# Patient Record
Sex: Male | Born: 1955 | Race: White | Hispanic: No | Marital: Married | Smoking: Current every day smoker
Health system: Southern US, Community
[De-identification: ages and names within clinical notes are randomized; demographics above are authoritative.]

## PROBLEM LIST (undated history)

## (undated) DIAGNOSIS — F329 Major depressive disorder, single episode, unspecified: Secondary | ICD-10-CM

## (undated) DIAGNOSIS — I10 Essential (primary) hypertension: Secondary | ICD-10-CM

## (undated) DIAGNOSIS — F1010 Alcohol abuse, uncomplicated: Secondary | ICD-10-CM

## (undated) DIAGNOSIS — J449 Chronic obstructive pulmonary disease, unspecified: Secondary | ICD-10-CM

## (undated) DIAGNOSIS — M1990 Unspecified osteoarthritis, unspecified site: Secondary | ICD-10-CM

## (undated) DIAGNOSIS — Z972 Presence of dental prosthetic device (complete) (partial): Secondary | ICD-10-CM

## (undated) DIAGNOSIS — K589 Irritable bowel syndrome without diarrhea: Secondary | ICD-10-CM

## (undated) DIAGNOSIS — F32A Depression, unspecified: Secondary | ICD-10-CM

## (undated) DIAGNOSIS — K219 Gastro-esophageal reflux disease without esophagitis: Secondary | ICD-10-CM

## (undated) DIAGNOSIS — G459 Transient cerebral ischemic attack, unspecified: Secondary | ICD-10-CM

## (undated) DIAGNOSIS — A809 Acute poliomyelitis, unspecified: Secondary | ICD-10-CM

## (undated) DIAGNOSIS — E039 Hypothyroidism, unspecified: Secondary | ICD-10-CM

## (undated) DIAGNOSIS — F419 Anxiety disorder, unspecified: Secondary | ICD-10-CM

## (undated) HISTORY — PX: SHOULDER ARTHROSCOPY: SHX128

## (undated) HISTORY — PX: APPENDECTOMY: SHX54

---
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
## 2006-05-28 ENCOUNTER — Other Ambulatory Visit: Payer: Self-pay

## 2006-05-28 ENCOUNTER — Inpatient Hospital Stay: Payer: Self-pay | Admitting: Internal Medicine

## 2007-06-14 ENCOUNTER — Ambulatory Visit: Payer: Self-pay | Admitting: Gastroenterology

## 2008-01-07 ENCOUNTER — Emergency Department: Payer: Self-pay | Admitting: Emergency Medicine

## 2009-11-17 ENCOUNTER — Ambulatory Visit: Payer: Self-pay | Admitting: Unknown Physician Specialty

## 2012-05-03 ENCOUNTER — Ambulatory Visit: Payer: Self-pay | Admitting: Internal Medicine

## 2012-07-11 HISTORY — PX: SHOULDER ARTHROSCOPY: SHX128

## 2012-09-04 DIAGNOSIS — M75122 Complete rotator cuff tear or rupture of left shoulder, not specified as traumatic: Secondary | ICD-10-CM | POA: Insufficient documentation

## 2013-04-15 DIAGNOSIS — M5416 Radiculopathy, lumbar region: Secondary | ICD-10-CM | POA: Insufficient documentation

## 2013-05-06 ENCOUNTER — Encounter: Payer: Self-pay | Admitting: Specialist

## 2013-05-11 ENCOUNTER — Encounter: Payer: Self-pay | Admitting: Specialist

## 2014-04-03 ENCOUNTER — Ambulatory Visit: Payer: Self-pay | Admitting: Orthopedic Surgery

## 2014-09-17 ENCOUNTER — Ambulatory Visit: Payer: Self-pay | Admitting: Unknown Physician Specialty

## 2014-09-17 DIAGNOSIS — M25569 Pain in unspecified knee: Secondary | ICD-10-CM | POA: Insufficient documentation

## 2014-09-30 ENCOUNTER — Ambulatory Visit: Payer: Self-pay | Admitting: Unknown Physician Specialty

## 2014-11-11 ENCOUNTER — Encounter: Payer: Self-pay | Admitting: *Deleted

## 2014-11-11 ENCOUNTER — Other Ambulatory Visit: Payer: Self-pay

## 2014-11-24 ENCOUNTER — Inpatient Hospital Stay: Admission: RE | Admit: 2014-11-24 | Payer: Medicaid Other | Source: Ambulatory Visit

## 2014-11-25 DIAGNOSIS — F17200 Nicotine dependence, unspecified, uncomplicated: Secondary | ICD-10-CM | POA: Diagnosis not present

## 2014-11-25 DIAGNOSIS — Z79899 Other long term (current) drug therapy: Secondary | ICD-10-CM | POA: Diagnosis not present

## 2014-11-25 DIAGNOSIS — M94261 Chondromalacia, right knee: Secondary | ICD-10-CM | POA: Diagnosis not present

## 2014-11-25 DIAGNOSIS — S83249A Other tear of medial meniscus, current injury, unspecified knee, initial encounter: Secondary | ICD-10-CM | POA: Diagnosis present

## 2014-11-25 DIAGNOSIS — Z8249 Family history of ischemic heart disease and other diseases of the circulatory system: Secondary | ICD-10-CM | POA: Diagnosis not present

## 2014-11-25 DIAGNOSIS — M23203 Derangement of unspecified medial meniscus due to old tear or injury, right knee: Secondary | ICD-10-CM | POA: Diagnosis not present

## 2014-11-25 DIAGNOSIS — J449 Chronic obstructive pulmonary disease, unspecified: Secondary | ICD-10-CM | POA: Diagnosis not present

## 2014-11-25 NOTE — Patient Instructions (Signed)
  Your procedure is scheduled on: 11-26-14 Report to [facility] Desk 2nd Floor To find out your arrival time please call [phone] between 1PM - 3PM on 11-25-14  Remember: Instructions that are not followed completely may result in serious medical risk, up to and including death, or upon the discretion of your surgeon and anesthesiologist your surgery may need to be rescheduled.    __x__ 1. Do not eat food or drink liquids after midnight. No gum chewing or hard candies.     __x__ 2. No Alcohol for 24 hours before or after surgery.   ____ 3. Bring all medications with you on the day of surgery if instructed.    ____ 4. Notify your doctor if there is any change in your medical condition     (cold, fever, infections).     Do not wear jewelry, make-up, hairpins, clips or nail polish.  Do not wear lotions, powders, or perfumes. You may wear deodorant.  Do not shave 48 hours prior to surgery. Men may shave face and neck.  Do not bring valuables to the hospital.    [facility] is not responsible for any belongings or valuables.               Contacts, dentures or bridgework may not be worn into surgery.  Leave your suitcase in the car. After surgery it may be brought to your room.  For patients admitted to the hospital, discharge time is determined by your                treatment team.   Patients discharged the day of surgery will not be allowed to drive home.   Please read over the following fact sheets that you were given:     ____ Take these medicines the morning of surgery with A SIP OF WATER:    1.Levothyroxine  2. Metoprolol  3. Venlafaxine  4.  5.  6.  ____ Fleet Enema (as directed)   ____ Use CHG Soap as directed  ____ Use inhalers on the day of surgery  ____ Stop metformin 2 days prior to surgery    ____ Take 1/2 of usual insulin dose the night before surgery and none on the morning of surgery.   ____ Stop Coumadin/Plavix/aspirin on  N/A  ____ Stop Anti-inflammatories on N/A   ____ Stop supplements until after surgery.    ____ Bring C-Pap to the hospital.

## 2014-11-26 ENCOUNTER — Ambulatory Visit: Payer: Medicaid Other | Admitting: Anesthesiology

## 2014-11-26 ENCOUNTER — Encounter
Admission: RE | Disposition: A | Discharge status: Home / Self Care | Payer: Self-pay | Source: Ambulatory Visit | Attending: Unknown Physician Specialty

## 2014-11-26 ENCOUNTER — Ambulatory Visit
Admission: RE | Admit: 2014-11-26 | Discharge: 2014-11-26 | Disposition: A | Discharge status: Home / Self Care | Payer: Medicaid Other | Source: Ambulatory Visit | Attending: Unknown Physician Specialty | Admitting: Unknown Physician Specialty

## 2014-11-26 ENCOUNTER — Encounter: Payer: Self-pay | Admitting: Anesthesiology

## 2014-11-26 DIAGNOSIS — Z79899 Other long term (current) drug therapy: Secondary | ICD-10-CM | POA: Insufficient documentation

## 2014-11-26 DIAGNOSIS — M23203 Derangement of unspecified medial meniscus due to old tear or injury, right knee: Secondary | ICD-10-CM | POA: Insufficient documentation

## 2014-11-26 DIAGNOSIS — M94261 Chondromalacia, right knee: Secondary | ICD-10-CM | POA: Insufficient documentation

## 2014-11-26 DIAGNOSIS — J449 Chronic obstructive pulmonary disease, unspecified: Secondary | ICD-10-CM | POA: Insufficient documentation

## 2014-11-26 DIAGNOSIS — Z8249 Family history of ischemic heart disease and other diseases of the circulatory system: Secondary | ICD-10-CM | POA: Insufficient documentation

## 2014-11-26 DIAGNOSIS — Z9889 Other specified postprocedural states: Secondary | ICD-10-CM

## 2014-11-26 DIAGNOSIS — F17200 Nicotine dependence, unspecified, uncomplicated: Secondary | ICD-10-CM | POA: Insufficient documentation

## 2014-11-26 HISTORY — PX: KNEE ARTHROSCOPY WITH MEDIAL MENISECTOMY: SHX5651

## 2014-11-26 HISTORY — DX: Anxiety disorder, unspecified: F41.9

## 2014-11-26 HISTORY — DX: Unspecified osteoarthritis, unspecified site: M19.90

## 2014-11-26 HISTORY — DX: Depression, unspecified: F32.A

## 2014-11-26 HISTORY — DX: Hypothyroidism, unspecified: E03.9

## 2014-11-26 HISTORY — DX: Chronic obstructive pulmonary disease, unspecified: J44.9

## 2014-11-26 HISTORY — DX: Major depressive disorder, single episode, unspecified: F32.9

## 2014-11-26 HISTORY — DX: Essential (primary) hypertension: I10

## 2014-11-26 SURGERY — ARTHROSCOPY, KNEE, WITH MEDIAL MENISCECTOMY
Anesthesia: General | Laterality: Right

## 2014-11-26 MED ORDER — BUPIVACAINE HCL (PF) 0.5 % IJ SOLN
INTRAMUSCULAR | Status: DC | PRN
  Administered 2014-11-26: 15 mL

## 2014-11-26 MED ORDER — LACTATED RINGERS IR SOLN
Status: DC | PRN
  Administered 2014-11-26: 2700 mL/h

## 2014-11-26 MED ORDER — LIDOCAINE HCL (CARDIAC) 20 MG/ML IV SOLN
INTRAVENOUS | Status: DC | PRN
  Administered 2014-11-26: 50 mg via INTRAVENOUS

## 2014-11-26 MED ORDER — FENTANYL CITRATE (PF) 100 MCG/2ML IJ SOLN
INTRAMUSCULAR | Status: AC
Start: 2014-11-26 — End: 2014-11-26
  Administered 2014-11-26: 25 ug via INTRAVENOUS
  Filled 2014-11-26: qty 2

## 2014-11-26 MED ORDER — ONDANSETRON HCL 4 MG/2ML IJ SOLN: 4.0000 mg | Freq: Once | INTRAMUSCULAR | Status: DC | PRN

## 2014-11-26 MED ORDER — MIDAZOLAM HCL 2 MG/2ML IJ SOLN
INTRAMUSCULAR | Status: DC | PRN
  Administered 2014-11-26: 2 mg via INTRAVENOUS

## 2014-11-26 MED ORDER — ONDANSETRON HCL 4 MG/2ML IJ SOLN
INTRAMUSCULAR | Status: DC | PRN
  Administered 2014-11-26: 4 mg via INTRAVENOUS

## 2014-11-26 MED ORDER — FENTANYL CITRATE (PF) 100 MCG/2ML IJ SOLN
25.0000 ug | INTRAMUSCULAR | Status: DC | PRN
  Administered 2014-11-26 (×4): 25 ug via INTRAVENOUS

## 2014-11-26 MED ORDER — HYDROCODONE-ACETAMINOPHEN 5-325 MG PO TABS
ORAL_TABLET | ORAL | Status: AC
  Filled 2014-11-26: qty 2

## 2014-11-26 MED ORDER — BUPIVACAINE HCL (PF) 0.5 % IJ SOLN
INTRAMUSCULAR | Status: AC
  Filled 2014-11-26: qty 30

## 2014-11-26 MED ORDER — FAMOTIDINE 20 MG PO TABS
20.0000 mg | ORAL_TABLET | Freq: Once | ORAL | Status: AC
  Administered 2014-11-26: 20 mg via ORAL

## 2014-11-26 MED ORDER — LACTATED RINGERS IV SOLN
INTRAVENOUS | Status: DC
  Administered 2014-11-26 (×2): via INTRAVENOUS

## 2014-11-26 MED ORDER — FAMOTIDINE 20 MG PO TABS
ORAL_TABLET | ORAL | Status: AC
  Filled 2014-11-26: qty 1

## 2014-11-26 MED ORDER — KETOROLAC TROMETHAMINE 30 MG/ML IJ SOLN
INTRAMUSCULAR | Status: DC | PRN
  Administered 2014-11-26: 30 mg via INTRAVENOUS

## 2014-11-26 MED ORDER — HYDROCODONE-ACETAMINOPHEN 5-325 MG PO TABS
1.0000 | ORAL_TABLET | Freq: Four times a day (QID) | ORAL | Status: DC | PRN
  Administered 2014-11-26: 2 via ORAL

## 2014-11-26 MED ORDER — PROPOFOL 10 MG/ML IV BOLUS
INTRAVENOUS | Status: DC | PRN
  Administered 2014-11-26: 200 mg via INTRAVENOUS

## 2014-11-26 MED ORDER — FENTANYL CITRATE (PF) 100 MCG/2ML IJ SOLN
INTRAMUSCULAR | Status: DC | PRN
  Administered 2014-11-26: 50 ug via INTRAVENOUS
  Administered 2014-11-26: 100 ug via INTRAVENOUS
  Administered 2014-11-26: 50 ug via INTRAVENOUS

## 2014-11-26 MED ORDER — HYDROCODONE-ACETAMINOPHEN 5-325 MG PO TABS: 1.0000 | ORAL_TABLET | Freq: Four times a day (QID) | ORAL | Status: DC | PRN

## 2014-11-26 SURGICAL SUPPLY — 44 items
ADAPTER IRRIG TUBE 2 SPIKE SOL (ADAPTER) ×2 IMPLANT
ADPR TBG 2 SPK PMP STRL ASCP (ADAPTER) ×1
ARTHROWAND PARAGON T2 (SURGICAL WAND)
BNDG ESMARK 6X12 TAN STRL LF (GAUZE/BANDAGES/DRESSINGS) ×3 IMPLANT
BUR RADIUS 3.5 (BURR) IMPLANT
BUR RADIUS 4.0X18.5 (BURR) ×2 IMPLANT
BUR ROUND 5.5 (BURR) IMPLANT
BURR ROUND 12 FLUTE 4.0MM (BURR) IMPLANT
CHLORAPREP W/TINT 26ML (MISCELLANEOUS) ×1 IMPLANT
CUFF TOURN SGL QUICK 24 (TOURNIQUET CUFF)
CUFF TOURN SGL QUICK 34 (TOURNIQUET CUFF)
CUFF TRNQT CYL 24X4X40X1 (TOURNIQUET CUFF) IMPLANT
CUFF TRNQT CYL 34X4X40X1 (TOURNIQUET CUFF) IMPLANT
CUTTER SLOTTED WHISKER 4.0 (BURR) ×2 IMPLANT
DRAPE LEGGINS SURG 28X43 STRL (DRAPES) ×3 IMPLANT
DURAPREP 26ML APPLICATOR (WOUND CARE) ×2 IMPLANT
GAUZE SPONGE 4X4 12PLY STRL (GAUZE/BANDAGES/DRESSINGS) ×5 IMPLANT
GLOVE BIO SURGEON STRL SZ7.5 (GLOVE) ×5 IMPLANT
GLOVE BIO SURGEON STRL SZ8 (GLOVE) ×3 IMPLANT
GLOVE INDICATOR 8.0 STRL GRN (GLOVE) ×5 IMPLANT
GOWN STRL REIN 2XL XLG LVL4 (GOWN DISPOSABLE) ×3 IMPLANT
IV LACTATED RINGER IRRG 3000ML (IV SOLUTION) ×9
IV LR IRRIG 3000ML ARTHROMATIC (IV SOLUTION) ×2 IMPLANT
KIT RM TURNOVER STRD PROC AR (KITS) ×3 IMPLANT
MANIFOLD 4PT FOR NEPTUNE1 (MISCELLANEOUS) ×3 IMPLANT
PACK ARTHROSCOPY KNEE (MISCELLANEOUS) ×3 IMPLANT
PAD CAST CTTN 4X4 STRL (SOFTGOODS) IMPLANT
PADDING CAST COTTON 4X4 STRL (SOFTGOODS) ×3
SET TUBE SUCT SHAVER OUTFL 24K (TUBING) ×3 IMPLANT
SET TUBE TIP INTRA-ARTICULAR (MISCELLANEOUS) ×3 IMPLANT
STOCKINETTE BIAS CUT 6 980064 (GAUZE/BANDAGES/DRESSINGS) ×2 IMPLANT
SUT ETH BLK MONO 3 0 FS 1 12/B (SUTURE) ×3 IMPLANT
SUT ETHILON 3-0 FS-10 30 BLK (SUTURE) ×3
SUTURE EHLN 3-0 FS-10 30 BLK (SUTURE) ×1 IMPLANT
SWAB DUAL CULTURE TRANS RED ST (MISCELLANEOUS) ×2 IMPLANT
TAPE MICROFOAM 4IN (TAPE) ×3 IMPLANT
TUBING ARTHRO INFLOW-ONLY STRL (TUBING) ×3 IMPLANT
WAND ARTHRO PARAGON T2 (SURGICAL WAND) IMPLANT
WAND COVAC 50 IFS (MISCELLANEOUS) IMPLANT
WAND HAND CNTRL MULTIVAC 50 (MISCELLANEOUS) ×2 IMPLANT
WAND HAND CNTRL MULTIVAC 90 (MISCELLANEOUS) IMPLANT
WAND MEGAVAC 90 (MISCELLANEOUS) IMPLANT
WAND ULTRAVAC 90 (MISCELLANEOUS) IMPLANT
WRAP KNEE W/COLD PACKS 25.5X14 (SOFTGOODS) ×3 IMPLANT

## 2014-11-26 NOTE — OR Nursing (Signed)
Medial chondral debridement right knee

## 2014-11-26 NOTE — Transfer of Care (Signed)
Immediate Anesthesia Transfer of Care Note  Patient: Russell Rivas  Procedure(s) Performed: Procedure(s): KNEE ARTHROSCOPY WITH MEDIAL MENISECTOMY (Right)  Patient Location: PACU  Anesthesia Type:General  Level of Consciousness: Alert, Awake, Oriented  Airway & Oxygen Therapy: Patient Spontanous Breathing  Post-op Assessment: Report given to RN  Post vital signs: Reviewed and stable  Last Vitals:  Filed Vitals:   11/26/14 0900  BP: 164/101  Pulse: 102  Temp: 36.7 C  Resp: 12    Complications: No apparent anesthesia complications

## 2014-11-26 NOTE — Anesthesia Postprocedure Evaluation (Signed)
  Anesthesia Post-op Note  Patient: Russell Rivas  Procedure(s) Performed: Procedure(s): KNEE ARTHROSCOPY WITH MEDIAL MENISECTOMY (Right)  Anesthesia type:General  Patient location: PACU  Post pain: Pain level controlled  Post assessment: Post-op Vital signs reviewed, Patient's Cardiovascular Status Stable, Respiratory Function Stable, Patent Airway and No signs of Nausea or vomiting  Post vital signs: Reviewed and stable  Last Vitals:  Filed Vitals:   11/26/14 1008  BP: 142/90  Pulse: 92  Temp: 36.4 C  Resp: 16    Level of consciousness: awake, alert  and patient cooperative  Complications: No apparent anesthesia complications

## 2014-11-26 NOTE — Anesthesia Procedure Notes (Signed)
Procedure Name: LMA Insertion Date/Time: 11/26/2014 7:39 AM Performed by: Eliberto Ivory Pre-anesthesia Checklist: Patient identified, Patient being monitored, Timeout performed, Emergency Drugs available and Suction available Patient Re-evaluated:Patient Re-evaluated prior to inductionOxygen Delivery Method: Circle system utilized Preoxygenation: Pre-oxygenation with 100% oxygen Intubation Type: IV induction Ventilation: Mask ventilation without difficulty LMA: LMA inserted LMA Size: 4.0 Tube type: Oral Number of attempts: 1 Placement Confirmation: positive ETCO2 and breath sounds checked- equal and bilateral Tube secured with: Tape Dental Injury: Teeth and Oropharynx as per pre-operative assessment

## 2014-11-26 NOTE — Brief Op Note (Signed)
11/26/2014  9:13 AM  PATIENT:  Russell Rivas  59 y.o. male  PRE-OPERATIVE DIAGNOSIS:  ymedial meniscus tear right knee  POST-OPERATIVE DIAGNOSIS:  medial meniscus tear , chondromalacia right knee  PROCEDURE:  Procedure(s): KNEE ARTHROSCOPY WITH MEDIAL MENISECTOMY (Right)  SURGEON:  Surgeon(s) and Role:    * Leanor Kail, MD - Primary  PHYSICIAN ASSISTANT:   ASSISTANTS: none   ANESTHESIA:   general  EBL:  Total I/O In: 600 [I.V.:600] Out: -   BLOOD ADMINISTERED:none  DRAINS: none   LOCAL MEDICATIONS USED:  MARCAINE     SPECIMEN:  Source of Specimen:  knee fluid  DISPOSITION OF SPECIMEN:  PATHOLOGY  COUNTS:  ACTION TAKEN: None  TOURNIQUET:  * No tourniquets in log *  DICTATION:   PLAN OF CARE: Discharge to home after PACU  PATIENT DISPOSITION:  PACU - hemodynamically stable.   Delay start of Pharmacological VTE agent (>24hrs) due to surgical blood loss or risk of bleeding: not applicable

## 2014-11-26 NOTE — Op Note (Addendum)
Preoperative diagnosis: Torn medial meniscus right knee Postop diagnosis: same plus chondromalacia Operation: arthroscopic partial medial meniscectomy plus chondral debridement Surgeon: Dr. Leanor Kail Anesthesia: Gen.   History: Patient's had a long history of right knee pain. He had a remote anterior cruciate ligament reconstruction and subsequent infection. The infection cleared. Recently the patient had been having medial knee pain. His plain films did not reveal any significant degenerative changes. He had an MRI which revealed a torn medial meniscus. His anterior cruciate ligament graft was frayed. The patient was scheduled for surgery due to persistent discomfort despite conservative treatment.  The patient was taken the operating room where satisfactory general anesthesia was achieved. A tourniquet and leg holder were was applied to the right thigh. The left lower extremity was supported with a well leg holder. The right knee was prepped and draped in usual fashion for an arthroscopic procedure. An inflow cannula was introduced superomedially. The joint was distended with lactated Ringer's. Scope was introduced through an inferolateral puncture wound and a probe through an inferomedial puncture wound. Inspection of the medial compartment revealed that there was some fraying of the articular surfaces. There was maceration of the medial meniscus. I resected the torn portion of the medial meniscus using a motorized resector. The remaining rim was contoured with an ArthroCare angled wand. Inspection of the intercondylar notch revealed that there was fraying of the anterior cruciate graft. The graft was basically intact, however. Inspection of the lateral compartment revealed some grade 2 chondral changes in the anterior aspect of the weightbearing portion of the lateral femoral condyle. There were also some grade 2-3 chondral changes about the anterior aspect of the lateral tibial plateau articular  surface. These areas were debrided with a turbo whisker. The scope was introduced through the intercondylar notch into into the posterior recess of the medial and lateral compartments. No additional pathology was noted in the posterior recesses. Trochlear groove was inspected and appeared to be fairly smooth.  Patella surface was also fairly smooth. I observed patella tracking from the superomedial portal. The the patella seemed to track fairly well.  The instruments were removed from the joint at this time. The puncture wounds were closed with 3-0 nylon in vertical mattress fashion. I injected each puncture wound with several cc of half percent Marcaine without epinephrine. Betadine was applied the wounds followed by sterile dressing. An ice pack was applied to the right knee.  The tourniquet was not inflated during the course of the procedure. Blood loss was negligible. I did culture the synovial fluid during the course of the procedure. It was sent to the lab for Gram stain along with aerobic and anaerobic cultures.

## 2014-11-26 NOTE — Anesthesia Preprocedure Evaluation (Addendum)
Anesthesia Evaluation  Patient identified by MRN, date of birth, ID band Patient awake    Reviewed: Allergy & Precautions, NPO status , Patient's Chart, lab work & pertinent test results, reviewed documented beta blocker date and time   Airway Mallampati: II  TM Distance: >3 FB Neck ROM: Full    Dental  (+) Partial Upper, Partial Lower   Pulmonary COPDCurrent Smoker,          Cardiovascular hypertension,     Neuro/Psych Anxiety Depression    GI/Hepatic   Endo/Other  Hypothyroidism   Renal/GU      Musculoskeletal  (+) Arthritis -,   Abdominal   Peds  Hematology   Anesthesia Other Findings   Reproductive/Obstetrics                            Anesthesia Physical Anesthesia Plan  ASA: III  Anesthesia Plan: General   Post-op Pain Management:    Induction: Intravenous  Airway Management Planned: LMA  Additional Equipment:   Intra-op Plan:   Post-operative Plan:   Informed Consent: I have reviewed the patients History and Physical, chart, labs and discussed the procedure including the risks, benefits and alternatives for the proposed anesthesia with the patient or authorized representative who has indicated his/her understanding and acceptance.     Plan Discussed with: CRNA  Anesthesia Plan Comments:         Anesthesia Quick Evaluation

## 2014-11-26 NOTE — H&P (Signed)
  H and P reviewed. No changes. Uploaded at later date. 

## 2014-11-26 NOTE — Discharge Instructions (Addendum)
Diet: As you were doing prior to hospitalization   Shower:  May shower but keep the wounds dry, use an occlusive plastic wrap or extremity protector. NO SOAKING IN TUB.   Dressing:  You may remove your dressing 3 days after surgery. Then apply waterproof bandaids and change them after showering.   Activity:  Increase activity slowly as tolerated. Can drive when comfortable.    Weight Bearing: as tolerated.    To prevent constipation: you may use a stool softener such as - Miralax (over the counter) for constipation as needed.    To prevent venous clotting Take one 81 mg. ASA tablet  2X per day for about 2 weeks post surgery.  Precautions:  If you experience chest pain or shortness of breath - call 911 immediately for transfer to the hospital emergency department!!  If you develop a fever greater that 101 F, purulent drainage from wound, increased redness or drainage from wound, or calf pain -- Call the office at [phone]                                             Follow- Up Appointment:  Please call for an appointment to be seen in 1 wk.      AMBULATORY SURGERY  DISCHARGE INSTRUCTIONS   1) The drugs that you were given will stay in your system until tomorrow so for the next 24 hours you should not:  A) Drive an automobile B) Make any legal decisions C) Drink any alcoholic beverage   2) You may resume regular meals tomorrow.  Today it is better to start with liquids and gradually work up to solid foods.  You may eat anything you prefer, but it is better to start with liquids, then soup and crackers, and gradually work up to solid foods.   3) Please notify your doctor immediately if you have any unusual bleeding, trouble breathing, redness and pain at the surgery site, drainage, fever, or pain not relieved by medication. 4)   5) Your post-operative visit with Dr.    George Ina                                 is: Date:                        Time:    Please call to  schedule your post-operative visit.  6) Additional Instructions: 7)

## 2014-11-27 ENCOUNTER — Encounter: Payer: Self-pay | Admitting: Unknown Physician Specialty

## 2014-11-30 LAB — ANAEROBIC CULTURE

## 2014-11-30 LAB — WOUND CULTURE: Culture: NO GROWTH

## 2014-12-05 DIAGNOSIS — S83209A Unspecified tear of unspecified meniscus, current injury, unspecified knee, initial encounter: Secondary | ICD-10-CM | POA: Insufficient documentation

## 2014-12-05 DIAGNOSIS — M12569 Traumatic arthropathy, unspecified knee: Secondary | ICD-10-CM | POA: Insufficient documentation

## 2014-12-26 LAB — GRAM STAIN

## 2015-02-27 ENCOUNTER — Emergency Department
Admission: EM | Admit: 2015-02-27 | Discharge: 2015-02-27 | Disposition: A | Discharge status: Home / Self Care | Payer: Medicare Other | Attending: Emergency Medicine | Admitting: Emergency Medicine

## 2015-02-27 ENCOUNTER — Emergency Department: Payer: Medicare Other

## 2015-02-27 ENCOUNTER — Encounter: Payer: Self-pay | Admitting: *Deleted

## 2015-02-27 DIAGNOSIS — Z79899 Other long term (current) drug therapy: Secondary | ICD-10-CM | POA: Diagnosis not present

## 2015-02-27 DIAGNOSIS — F10920 Alcohol use, unspecified with intoxication, uncomplicated: Secondary | ICD-10-CM

## 2015-02-27 DIAGNOSIS — F10120 Alcohol abuse with intoxication, uncomplicated: Secondary | ICD-10-CM | POA: Diagnosis not present

## 2015-02-27 DIAGNOSIS — I10 Essential (primary) hypertension: Secondary | ICD-10-CM | POA: Diagnosis not present

## 2015-02-27 DIAGNOSIS — R079 Chest pain, unspecified: Secondary | ICD-10-CM | POA: Insufficient documentation

## 2015-02-27 DIAGNOSIS — Z720 Tobacco use: Secondary | ICD-10-CM | POA: Diagnosis not present

## 2015-02-27 HISTORY — DX: Alcohol abuse, uncomplicated: F10.10

## 2015-02-27 LAB — CBC WITH DIFFERENTIAL/PLATELET
Basophils Absolute: 0 10*3/uL (ref 0–0.1)
Basophils Relative: 1 %
Eosinophils Absolute: 0.1 10*3/uL (ref 0–0.7)
Eosinophils Relative: 2 %
HCT: 41.2 % (ref 40.0–52.0)
Hemoglobin: 14 g/dL (ref 13.0–18.0)
Lymphocytes Relative: 51 %
Lymphs Abs: 2.3 10*3/uL (ref 1.0–3.6)
MCH: 34.2 pg — ABNORMAL HIGH (ref 26.0–34.0)
MCHC: 33.9 g/dL (ref 32.0–36.0)
MCV: 100.8 fL — ABNORMAL HIGH (ref 80.0–100.0)
Monocytes Absolute: 0.5 10*3/uL (ref 0.2–1.0)
Monocytes Relative: 10 %
Neutro Abs: 1.6 10*3/uL (ref 1.4–6.5)
Neutrophils Relative %: 36 %
Platelets: 135 10*3/uL — ABNORMAL LOW (ref 150–440)
RBC: 4.09 MIL/uL — ABNORMAL LOW (ref 4.40–5.90)
RDW: 13.7 % (ref 11.5–14.5)
WBC: 4.5 10*3/uL (ref 3.8–10.6)

## 2015-02-27 LAB — COMPREHENSIVE METABOLIC PANEL
ALT: 81 U/L — ABNORMAL HIGH (ref 17–63)
AST: 120 U/L — ABNORMAL HIGH (ref 15–41)
Albumin: 4.2 g/dL (ref 3.5–5.0)
Alkaline Phosphatase: 57 U/L (ref 38–126)
Anion gap: 11 (ref 5–15)
BUN: 8 mg/dL (ref 6–20)
CO2: 26 mmol/L (ref 22–32)
Calcium: 8.7 mg/dL — ABNORMAL LOW (ref 8.9–10.3)
Chloride: 104 mmol/L (ref 101–111)
Creatinine, Ser: 0.62 mg/dL (ref 0.61–1.24)
GFR calc Af Amer: >60 mL/min (ref >60)
GFR calc non Af Amer: >60 mL/min (ref >60)
Glucose, Bld: 91 mg/dL (ref 65–99)
Potassium: 3.7 mmol/L (ref 3.5–5.1)
Sodium: 141 mmol/L (ref 135–145)
Total Bilirubin: 0.7 mg/dL (ref 0.3–1.2)
Total Protein: 7.8 g/dL (ref 6.5–8.1)

## 2015-02-27 LAB — LIPASE, BLOOD: Lipase: 44 U/L (ref 22–51)

## 2015-02-27 LAB — ETHANOL: Alcohol, Ethyl (B): 365 mg/dL (ref <5)

## 2015-02-27 LAB — TROPONIN I
Troponin I: <0.03 ng/mL (ref <0.031)
Troponin I: <0.03 ng/mL (ref <0.031)

## 2015-02-27 MED ORDER — ONDANSETRON HCL 4 MG/2ML IJ SOLN
4.0000 mg | Freq: Once | INTRAMUSCULAR | Status: AC
  Administered 2015-02-27: 4 mg via INTRAVENOUS
  Filled 2015-02-27: qty 2

## 2015-02-27 NOTE — ED Notes (Signed)
Patient verbalized that information was given to him for detox and the plan was for his girlfriend to take him to [place].

## 2015-02-27 NOTE — BHH Counselor (Signed)
Per Pt.s permission we gave his contact information to a Substance Abuse Peer Support from [employer], [place].

## 2015-02-27 NOTE — ED Provider Notes (Signed)
-----------------------------------------   8:59 AM on 02/27/2015 -----------------------------------------  Patient states he is not having chest pain now. He is awake and alert. No signs of withdrawal, he denies any hallucinations or shakes at this time. Vital signs are stable. Clinically does not appear to have any signs of alcohol drawn at this time.  We'll discharge the patient to do seen by TTS who advised follow-up, and his wife plans to take him to [facility] today for evaluation of detox.  Return precautions discussed the patient is agreeable with the plan. He's got a follow-up with physician regarding his chest pain and also frequent amounts.  Delman Kitten, MD 02/27/15 0900

## 2015-02-27 NOTE — BH Assessment (Signed)
Assessment Note  Russell Rivas is an 59 y.o. male who presents to the ER due to having chest pains. Patient states, his family has a history of heart disease and he was concerned about his heart. Patient has a long history of alcohol use. He states he start drinking when at an early age. He states, he stopped drinking this past December but relapsed this past March. He has been drinking on daily basis in the amount of a 5th of liquor. Patient has  Been impatient at several facilities in the past; [facility] and [facility].  Reported past symptoms of withdrawal are; cold sweats, shakes, dizziness & vomiting.  Patient denies, SI/HI and AV/H.  Per request of ER MD (Dr. Jacqualine Code), writer provided the pt. with information and instructions on how to access Outpatient. Substance Abuse Treatment ([facility], [facility] and [facility]).  Also provided patient with contact information for [facility] Peer Support Lanae Boast B.). Obtained verbal consent to give [facility] Peer Support his contact information, to follow up with him, to help him connect with support groups in the community.   Axis I: Alcohol Abuse Axis III:  Past Medical History  Diagnosis Date  . Hypertension   . Anxiety   . Depression   . Arthritis   . COPD (chronic obstructive pulmonary disease)     NO inhalers  . Hypothyroidism   . Alcohol abuse     pt reports drinking at least one pint everyday.    Axis IV: other psychosocial or environmental problems and problems related to social environment  Past Medical History:  Past Medical History  Diagnosis Date  . Hypertension   . Anxiety   . Depression   . Arthritis   . COPD (chronic obstructive pulmonary disease)     NO inhalers  . Hypothyroidism   . Alcohol abuse     pt reports drinking at least one pint everyday.     Past Surgical History  Procedure Laterality Date  . Knee arthroscopy    . Appendectomy    . Shoulder arthroscopy    . Knee arthroscopy with medial menisectomy  Right 11/26/2014    Procedure: KNEE ARTHROSCOPY WITH MEDIAL MENISECTOMY;  Surgeon: Leanor Kail, MD;  Location: [facility] ORS;  Service: Orthopedics;  Laterality: Right;    Family History: History reviewed. No pertinent family history.  Social History:  reports that he has been smoking Cigarettes.  He has a 30 pack-year smoking history. He does not have any smokeless tobacco history on file. He reports that he drinks alcohol. He reports that he does not use illicit drugs.  Additional Social History:  Alcohol / Drug Use Pain Medications: None Reported Prescriptions: None Reported Over the Counter: None Reported History of alcohol / drug use?: Yes Longest period of sobriety (when/how long): A year Negative Consequences of Use: Personal relationships, Work / Youth worker Withdrawal Symptoms: Sweats, Nausea / Vomiting, Weakness, Cramps, Change in blood pressure, Tremors, Fever / Chills, Agitation Substance #1 Name of Substance 1: Alcohol 1 - Age of First Use: Teenager 1 - Amount (size/oz): 5th of liquor 1 - Frequency: Daily 1 - Duration: 39 years 1 - Last Use / Amount: 08/19/206  CIWA: CIWA-Ar BP: 109/64 mmHg Pulse Rate: 75 COWS:    Allergies:  Allergies  Allergen Reactions  . Hydrocodone Itching    Home Medications:  (Not in a hospital admission)  OB/GYN Status:  No LMP for male patient.  General Assessment Data Location of Assessment: [facility] ED TTS Assessment: In system  Is this a Tele or Face-to-Face Assessment?: Face-to-Face Is this an Initial Assessment or a Re-assessment for this encounter?: Initial Assessment Marital status: Long term relationship (Engaged) Maiden name: n/a Is patient pregnant?: No Pregnancy Status: No Living Arrangements: Spouse/significant other Can pt return to current living arrangement?: Yes Admission Status: Voluntary Is patient capable of signing voluntary admission?: Yes Referral Source: Self/Family/Friend Insurance type: Medicare  Medical  Screening Exam ([place]) Medical Exam completed: Yes  Crisis Care Plan Living Arrangements: Spouse/significant other Name of Psychiatrist: n/a Name of Therapist: n/a  Education Status Is patient currently in school?: No Current Grade: n/a Highest grade of school patient has completed: 12th Grade Name of school: n/a Contact person: n/a  Risk to self with the past 6 months Suicidal Ideation: No Has patient been a risk to self within the past 6 months prior to admission? : No Suicidal Intent: No Has patient had any suicidal intent within the past 6 months prior to admission? : No Is patient at risk for suicide?: No Suicidal Plan?: No Has patient had any suicidal plan within the past 6 months prior to admission? : No Access to Means: No What has been your use of drugs/alcohol within the last 12 months?: Alcohol and cocaine use in the past. Previous Attempts/Gestures: No Other Self Harm Risks: None Reported Triggers for Past Attempts: None known Intentional Self Injurious Behavior: None Family Suicide History: Unknown Recent stressful life event(s):  (Relapse) Persecutory voices/beliefs?: No Depression: No Depression Symptoms: Guilt Substance abuse history and/or treatment for substance abuse?: Yes (Alcohol) Suicide prevention information given to non-admitted patients: Not applicable  Risk to Others within the past 6 months Homicidal Ideation: No Does patient have any lifetime risk of violence toward others beyond the six months prior to admission? : No Thoughts of Harm to Others: No Current Homicidal Intent: No Current Homicidal Plan: No Access to Homicidal Means: No Identified Victim: None Reported History of harm to others?: No Assessment of Violence: None Noted Violent Behavior Description: None Reported Does patient have access to weapons?: No Criminal Charges Pending?: No Does patient have a court date: No Is patient on probation?:  No  Psychosis Hallucinations: None noted Delusions: None noted  Mental Status Report Appearance/Hygiene: Unremarkable, In hospital gown Eye Contact: Poor Motor Activity: Unable to assess Speech: Logical/coherent Level of Consciousness: Alert Mood: Anxious, Sad, Guilty Affect: Appropriate to circumstance Anxiety Level: Minimal Thought Processes: Coherent, Relevant Judgement: Unimpaired Orientation: Person, Place, Time, Situation, Appropriate for developmental age Obsessive Compulsive Thoughts/Behaviors: Minimal  Cognitive Functioning Concentration: Normal Memory: Recent Intact, Remote Intact IQ: Average Insight: Fair Impulse Control: Poor Appetite: Fair Weight Loss: 0 Weight Gain: 0 Sleep: No Change Total Hours of Sleep: 8 Vegetative Symptoms: None  ADLScreening [facility] Assessment Services) Patient's cognitive ability adequate to safely complete daily activities?: Yes Patient able to express need for assistance with ADLs?: Yes Independently performs ADLs?: Yes (appropriate for developmental age)  Prior Inpatient Therapy Prior Inpatient Therapy: Yes Prior Therapy Dates: Patient didn't remember dates Prior Therapy Facilty/Provider(s): [facility] Reason for Treatment: Alcohol Abuse  Prior Outpatient Therapy Prior Outpatient Therapy: No Prior Therapy Facilty/Provider(s): n/a Reason for Treatment: n/a Does patient have an ACCT team?: No Does patient have Intensive In-House Services?  : No Does patient have [facility] services? : No Does patient have P4CC services?: No  ADL Screening (condition at time of admission) Patient's cognitive ability adequate to safely complete daily activities?: Yes Patient able to express need for assistance with ADLs?: Yes  Independently performs ADLs?: Yes (appropriate for developmental age)       Abuse/Neglect Assessment (Assessment to be complete while patient is alone) Physical Abuse: Denies Verbal Abuse: Denies Sexual  Abuse: Denies Exploitation of patient/patient's resources: Denies Self-Neglect: Denies Values / Beliefs Cultural Requests During Hospitalization: None Spiritual Requests During Hospitalization: None Consults Spiritual Care Consult Needed: No Social Work Consult Needed: No Regulatory affairs officer (For Healthcare) Does patient have an advance directive?: No    Additional Information 1:1 In Past 12 Months?: No CIRT Risk: No Elopement Risk: No Does patient have medical clearance?: Yes  Child/Adolescent Assessment Running Away Risk: Denies (Patient is an adult)  Disposition:  Disposition Initial Assessment Completed for this Encounter: Yes Disposition of Patient: Outpatient treatment Type of outpatient treatment: Adult, Chemical Dependence - Intensive Outpatient  On Site Evaluation by:   Reviewed with Physician:    Durene Cal 02/27/2015 9:37 AM

## 2015-02-27 NOTE — ED Provider Notes (Signed)
[facility] Emergency Department Provider Note  ____________________________________________  Time seen: 1:50 AM  I have reviewed the triage vital signs and the nursing notes.   HISTORY  Chief Complaint Chest Pain and Numbness      HPI Russell Rivas is a 59 y.o. male presents via EMS with complaint of 5 out of 10 central chest pain with radiation to the left arm 3 hours. Patient admits to a family history of myocardial infarctions. Patient denies any aggravating or alleviating factors for his pain. Patient received aspirin 325 mg and 1 inch of Nitropaste by EMS prior to presentation to emergency department. Patient admits to consuming approximately 1 pint of alcohol tonight.     Past Medical History  Diagnosis Date  . Hypertension   . Anxiety   . Depression   . Arthritis   . COPD (chronic obstructive pulmonary disease)     NO inhalers  . Hypothyroidism   . Alcohol abuse     pt reports drinking at least one pint everyday.     There are no active problems to display for this patient.   Past Surgical History  Procedure Laterality Date  . Knee arthroscopy    . Appendectomy    . Shoulder arthroscopy    . Knee arthroscopy with medial menisectomy Right 11/26/2014    Procedure: KNEE ARTHROSCOPY WITH MEDIAL MENISECTOMY;  Surgeon: Leanor Kail, MD;  Location: [facility] ORS;  Service: Orthopedics;  Laterality: Right;    Current Outpatient Rx  Name  Route  Sig  Dispense  Refill  . Multiple Vitamins-Minerals (MULTIVITAMIN WITH MINERALS) tablet   Oral   Take 1 tablet by mouth daily.           Allergies Hydrocodone  History reviewed. No pertinent family history.  Social History Social History  Substance Use Topics  . Smoking status: Current Every Day Smoker -- 1.00 packs/day for 30 years    Types: Cigarettes  . Smokeless tobacco: None  . Alcohol Use: Yes     Comment: 1 pint liquer per day.     Review of Systems  Constitutional:  Negative for fever. Eyes: Negative for visual changes. ENT: Negative for sore throat. Cardiovascular: Positive for chest pain. Respiratory: Negative for shortness of breath. Gastrointestinal: Negative for abdominal pain, vomiting and diarrhea. Genitourinary: Negative for dysuria. Musculoskeletal: Negative for back pain. Skin: Negative for rash. Neurological: Negative for headaches, focal weakness or numbness.   10-point ROS otherwise negative.  ____________________________________________   PHYSICAL EXAM:  VITAL SIGNS: ED Triage Vitals  Enc Vitals Group     BP 02/27/15 0158 133/89 mmHg     Pulse Rate 02/27/15 0158 87     Resp 02/27/15 0158 16     Temp 02/27/15 0158 97.8 F (36.6 C)     Temp Source 02/27/15 0158 Oral     SpO2 02/27/15 0158 93 %     Weight 02/27/15 0158 170 lb (77.111 kg)     Height 02/27/15 0158 5\' 6"  (1.676 m)     Head Cir --      Peak Flow --      Pain Score 02/27/15 0200 5     Pain Loc --      Pain Edu? --      Excl. in [place]? --      Constitutional: Alert and oriented. Well appearing and in no distress. EtOH on breath Eyes: Conjunctivae are normal. PERRL. Normal extraocular movements. ENT   Head: Normocephalic and atraumatic.   Nose:  No congestion/rhinnorhea.   Mouth/Throat: Mucous membranes are moist.   Neck: No stridor. Hematological/Lymphatic/Immunilogical: No cervical lymphadenopathy. Cardiovascular: Normal rate, regular rhythm. Normal and symmetric distal pulses are present in all extremities. No murmurs, rubs, or gallops. Respiratory: Normal respiratory effort without tachypnea nor retractions. Breath sounds are clear and equal bilaterally. No wheezes/rales/rhonchi. Gastrointestinal: Soft and nontender. No distention. There is no CVA tenderness. Genitourinary: deferred Musculoskeletal: Nontender with normal range of motion in all extremities. No joint effusions.  No lower extremity tenderness nor edema. Neurologic:  Normal speech  and language. No gross focal neurologic deficits are appreciated. Speech is normal.  Skin:  Skin is warm, dry and intact. No rash noted. Psychiatric: Mood and affect are normal. Speech and behavior are normal. Patient exhibits appropriate insight and judgment.  ____________________________________________    LABS (pertinent positives/negatives)  Labs Reviewed  CBC WITH DIFFERENTIAL/PLATELET - Abnormal; Notable for the following:    RBC 4.09 (*)    MCV 100.8 (*)    MCH 34.2 (*)    Platelets 135 (*)    All other components within normal limits  COMPREHENSIVE METABOLIC PANEL - Abnormal; Notable for the following:    Calcium 8.7 (*)    AST 120 (*)    ALT 81 (*)    All other components within normal limits  TROPONIN I  LIPASE, BLOOD  ETHANOL    ____________________________________________   EKG  ED ECG REPORT I, BROWN, Welda N, the attending physician, personally viewed and interpreted this ECG.   Date: 02/27/2015  EKG Time: 1:59 AM  Rate: 86  Rhythm: Normal sinus rhythm  Axis: None  Intervals: Normal  ST&T Change: None   ____________________________________________    RADIOLOGY  DG Chest Portable 1 View (Final result) Result time: 02/27/15 02:56:05   Final result by Rad Results In Interface (02/27/15 02:56:05)   Narrative:   CLINICAL DATA: Acute onset of mid chest pain. Initial encounter.  EXAM: PORTABLE CHEST - 1 VIEW  COMPARISON: None.  FINDINGS: The lungs are well-aerated and clear. There is no evidence of focal opacification, pleural effusion or pneumothorax.  The cardiomediastinal silhouette is within normal limits. No acute osseous abnormalities are seen. The patient is status post left-sided rotator cuff repair.  IMPRESSION: No acute cardiopulmonary process seen.   Electronically Signed By: Garald Balding M.D. On: 02/27/2015 02:56         INITIAL IMPRESSION / ASSESSMENT AND PLAN / ED COURSE  Pertinent labs & imaging  results that were available during my care of the patient were reviewed by me and considered in my medical decision making (see chart for details). ----------------------------------------- 7:01 AM on 02/27/2015 -----------------------------------------  Cardiac enzymes negative 2 patient with a EtOH level is 365 Patient requested alcohol detox   ____________________________________________   FINAL CLINICAL IMPRESSION(S) / ED DIAGNOSES  Final diagnoses:  Alcohol intoxication, uncomplicated      Gregor Hams, MD 02/27/15 [phone]

## 2015-02-27 NOTE — ED Notes (Signed)
Pt arrived via EMS with ETOH on board (pt reports one pint) reporting left sided chest pain and left arm numbeness beginning 3 hours ago. Pt reports SOB and lightheadedness with the chest pain. Pt has hx of heart attacks in family but denies having MIs in the past. EMS administered 325 aspirin and 1 in Nitro paste to left side of chest. Pt reports nitro paste decreased pain level.

## 2015-02-27 NOTE — Progress Notes (Signed)
Hannah with behavorial intake notified of TTS consult.

## 2015-02-27 NOTE — Discharge Instructions (Signed)
°Alcohol Intoxication °Alcohol intoxication occurs when the amount of alcohol that a person has consumed impairs his or her ability to mentally and physically function. Alcohol directly impairs the normal chemical activity of the brain. Drinking large amounts of alcohol can lead to changes in mental function and behavior, and it can cause many physical effects that can be harmful.  °Alcohol intoxication can range in severity from mild to very severe. Various factors can affect the level of intoxication that occurs, such as the person's age, gender, weight, frequency of alcohol consumption, and the presence of other medical conditions (such as diabetes, seizures, or heart conditions). Dangerous levels of alcohol intoxication may occur when people drink large amounts of alcohol in a short period (binge drinking). Alcohol can also be especially dangerous when combined with certain prescription medicines or "recreational" drugs. °SIGNS AND SYMPTOMS °Some common signs and symptoms of mild alcohol intoxication include: °· Loss of coordination. °· Changes in mood and behavior. °· Impaired judgment. °· Slurred speech. °As alcohol intoxication progresses to more severe levels, other signs and symptoms will appear. These may include: °· Vomiting. °· Confusion and impaired memory. °· Slowed breathing. °· Seizures. °· Loss of consciousness. °DIAGNOSIS  °Your health care provider will take a medical history and perform a physical exam. You will be asked about the amount and type of alcohol you have consumed. Blood tests will be done to measure the concentration of alcohol in your blood. In many places, your blood alcohol level must be lower than 80 mg/dL (0.08%) to legally drive. However, many dangerous effects of alcohol can occur at much lower levels.  °TREATMENT  °People with alcohol intoxication often do not require treatment. Most of the effects of alcohol intoxication are temporary, and they go away as the alcohol  naturally leaves the body. Your health care provider will monitor your condition until you are stable enough to go home. Fluids are sometimes given through an IV access tube to help prevent dehydration.  °HOME CARE INSTRUCTIONS °· Do not drive after drinking alcohol. °· Stay hydrated. Drink enough water and fluids to keep your urine clear or pale yellow. Avoid caffeine.   °· Only take over-the-counter or prescription medicines as directed by your health care provider.   °SEEK MEDICAL CARE IF:  °· You have persistent vomiting.   °· You do not feel better after a few days. °· You have frequent alcohol intoxication. Your health care provider can help determine if you should see a substance use treatment counselor. °SEEK IMMEDIATE MEDICAL CARE IF:  °· You become shaky or tremble when you try to stop drinking.   °· You shake uncontrollably (seizure).   °· You throw up (vomit) blood. This may be bright red or may look like black coffee grounds.   °· You have blood in your stool. This may be bright red or may appear as a black, tarry, bad smelling stool.   °· You become lightheaded or faint.   °MAKE SURE YOU:  °· Understand these instructions. °· Will watch your condition. °· Will get help right away if you are not doing well or get worse. °Document Released: 04/06/2005 Document Revised: 02/27/2013 Document Reviewed: 11/30/2012 °ExitCare® Patient Information ©2015 [employer]. This information is not intended to replace advice given to you by your health care provider. Make sure you discuss any questions you have with your health care provider. °Chest Pain (Nonspecific) °It is often hard to give a specific diagnosis for the cause of chest pain. There is always a chance that your pain could   be related to something serious, such as a heart attack or a blood clot in the lungs. You need to follow up with your health care provider for further evaluation. °CAUSES  °· Heartburn. °· Pneumonia or bronchitis. °· Anxiety or  stress. °· Inflammation around your heart (pericarditis) or lung (pleuritis or pleurisy). °· A blood clot in the lung. °· A collapsed lung (pneumothorax). It can develop suddenly on its own (spontaneous pneumothorax) or from trauma to the chest. °· Shingles infection (herpes zoster virus). °The chest wall is composed of bones, muscles, and cartilage. Any of these can be the source of the pain. °· The bones can be bruised by injury. °· The muscles or cartilage can be strained by coughing or overwork. °· The cartilage can be affected by inflammation and become sore (costochondritis). °DIAGNOSIS  °Lab tests or other studies may be needed to find the cause of your pain. Your health care provider may have you take a test called an ambulatory electrocardiogram (ECG). An ECG records your heartbeat patterns over a 24-hour period. You may also have other tests, such as: °· Transthoracic echocardiogram (TTE). During echocardiography, sound waves are used to evaluate how blood flows through your heart. °· Transesophageal echocardiogram (TEE). °· Cardiac monitoring. This allows your health care provider to monitor your heart rate and rhythm in real time. °· Holter monitor. This is a portable device that records your heartbeat and can help diagnose heart arrhythmias. It allows your health care provider to track your heart activity for several days, if needed. °· Stress tests by exercise or by giving medicine that makes the heart beat faster. °TREATMENT  °· Treatment depends on what may be causing your chest pain. Treatment may include: °· Acid blockers for heartburn. °· Anti-inflammatory medicine. °· Pain medicine for inflammatory conditions. °· Antibiotics if an infection is present. °· You may be advised to change lifestyle habits. This includes stopping smoking and avoiding alcohol, caffeine, and chocolate. °· You may be advised to keep your head raised (elevated) when sleeping. This reduces the chance of acid going backward  from your stomach into your esophagus. °Most of the time, nonspecific chest pain will improve within 2-3 days with rest and mild pain medicine.  °HOME CARE INSTRUCTIONS  °· If antibiotics were prescribed, take them as directed. Finish them even if you start to feel better. °· For the next few days, avoid physical activities that bring on chest pain. Continue physical activities as directed. °· Do not use any tobacco products, including cigarettes, chewing tobacco, or electronic cigarettes. °· Avoid drinking alcohol. °· Only take medicine as directed by your health care provider. °· Follow your health care provider's suggestions for further testing if your chest pain does not go away. °· Keep any follow-up appointments you made. If you do not go to an appointment, you could develop lasting (chronic) problems with pain. If there is any problem keeping an appointment, call to reschedule. °SEEK MEDICAL CARE IF:  °· Your chest pain does not go away, even after treatment. °· You have a rash with blisters on your chest. °· You have a fever. °SEEK IMMEDIATE MEDICAL CARE IF:  °· You have increased chest pain or pain that spreads to your arm, neck, jaw, back, or abdomen. °· You have shortness of breath. °· You have an increasing cough, or you cough up blood. °· You have severe back or abdominal pain. °· You feel nauseous or vomit. °· You have severe weakness. °· You faint. °· You   have chills. °This is an emergency. Do not wait to see if the pain will go away. Get medical help at once. Call your local emergency services (911 in [place] Do not drive yourself to the hospital. °MAKE SURE YOU:  °· Understand these instructions. °· Will watch your condition. °· Will get help right away if you are not doing well or get worse. °Document Released: 04/06/2005 Document Revised: 07/02/2013 Document Reviewed: 01/31/2008 °ExitCare® Patient Information ©2015 [employer]. This information is not intended to replace advice given to you by  your health care provider. Make sure you discuss any questions you have with your health care provider. ° °

## 2015-02-27 NOTE — ED Notes (Signed)
Pt verbalized to Dr.Brown the desire to begin detoxing from alcohol. Pt agrees to remain in ED and wait for consult.

## 2015-06-16 ENCOUNTER — Emergency Department
Admission: EM | Admit: 2015-06-16 | Discharge: 2015-06-16 | Disposition: A | Discharge status: Home / Self Care | Payer: Medicare Other | Attending: Emergency Medicine | Admitting: Emergency Medicine

## 2015-06-16 ENCOUNTER — Encounter: Payer: Self-pay | Admitting: Emergency Medicine

## 2015-06-16 DIAGNOSIS — G8929 Other chronic pain: Secondary | ICD-10-CM | POA: Insufficient documentation

## 2015-06-16 DIAGNOSIS — F1310 Sedative, hypnotic or anxiolytic abuse, uncomplicated: Secondary | ICD-10-CM | POA: Diagnosis not present

## 2015-06-16 DIAGNOSIS — F17210 Nicotine dependence, cigarettes, uncomplicated: Secondary | ICD-10-CM | POA: Diagnosis not present

## 2015-06-16 DIAGNOSIS — I10 Essential (primary) hypertension: Secondary | ICD-10-CM | POA: Insufficient documentation

## 2015-06-16 DIAGNOSIS — M545 Low back pain: Secondary | ICD-10-CM | POA: Insufficient documentation

## 2015-06-16 DIAGNOSIS — Z79899 Other long term (current) drug therapy: Secondary | ICD-10-CM | POA: Insufficient documentation

## 2015-06-16 DIAGNOSIS — F1010 Alcohol abuse, uncomplicated: Secondary | ICD-10-CM | POA: Diagnosis present

## 2015-06-16 LAB — CBC WITH DIFFERENTIAL/PLATELET
Basophils Absolute: 0 10*3/uL (ref 0–0.1)
Basophils Relative: 1 %
Eosinophils Absolute: 0.1 10*3/uL (ref 0–0.7)
Eosinophils Relative: 1 %
HCT: 45.4 % (ref 40.0–52.0)
Hemoglobin: 15.6 g/dL (ref 13.0–18.0)
Lymphocytes Relative: 23 %
Lymphs Abs: 1.6 10*3/uL (ref 1.0–3.6)
MCH: 35.2 pg — ABNORMAL HIGH (ref 26.0–34.0)
MCHC: 34.3 g/dL (ref 32.0–36.0)
MCV: 102.4 fL — ABNORMAL HIGH (ref 80.0–100.0)
Monocytes Absolute: 0.5 10*3/uL (ref 0.2–1.0)
Monocytes Relative: 7 %
Neutro Abs: 4.9 10*3/uL (ref 1.4–6.5)
Neutrophils Relative %: 68 %
Platelets: 159 10*3/uL (ref 150–440)
RBC: 4.43 MIL/uL (ref 4.40–5.90)
RDW: 12.6 % (ref 11.5–14.5)
WBC: 7.1 10*3/uL (ref 3.8–10.6)

## 2015-06-16 LAB — URINE DRUG SCREEN, QUALITATIVE (ARMC ONLY)
Amphetamines, Ur Screen: NOT DETECTED
Barbiturates, Ur Screen: NOT DETECTED
Benzodiazepine, Ur Scrn: POSITIVE — AB
Cannabinoid 50 Ng, Ur ~~LOC~~: NOT DETECTED
Cocaine Metabolite,Ur ~~LOC~~: NOT DETECTED
MDMA (Ecstasy)Ur Screen: NOT DETECTED
Methadone Scn, Ur: NOT DETECTED
Opiate, Ur Screen: NOT DETECTED
Phencyclidine (PCP) Ur S: NOT DETECTED
Tricyclic, Ur Screen: NOT DETECTED

## 2015-06-16 LAB — COMPREHENSIVE METABOLIC PANEL
ALT: 23 U/L (ref 17–63)
AST: 31 U/L (ref 15–41)
Albumin: 4.5 g/dL (ref 3.5–5.0)
Alkaline Phosphatase: 57 U/L (ref 38–126)
Anion gap: 10 (ref 5–15)
BUN: 7 mg/dL (ref 6–20)
CO2: 28 mmol/L (ref 22–32)
Calcium: 9 mg/dL (ref 8.9–10.3)
Chloride: 103 mmol/L (ref 101–111)
Creatinine, Ser: 0.73 mg/dL (ref 0.61–1.24)
GFR calc Af Amer: >60 mL/min (ref >60)
GFR calc non Af Amer: >60 mL/min (ref >60)
Glucose, Bld: 156 mg/dL — ABNORMAL HIGH (ref 65–99)
Potassium: 3.8 mmol/L (ref 3.5–5.1)
Sodium: 141 mmol/L (ref 135–145)
Total Bilirubin: 0.5 mg/dL (ref 0.3–1.2)
Total Protein: 7.8 g/dL (ref 6.5–8.1)

## 2015-06-16 LAB — ETHANOL: Alcohol, Ethyl (B): 195 mg/dL — ABNORMAL HIGH (ref <5)

## 2015-06-16 MED ORDER — CHLORDIAZEPOXIDE HCL 25 MG PO CAPS
25.0000 mg | ORAL_CAPSULE | Freq: Once | ORAL | Status: AC
  Administered 2015-06-16: 25 mg via ORAL
  Filled 2015-06-16: qty 1

## 2015-06-16 MED ORDER — CHLORDIAZEPOXIDE HCL 10 MG PO CAPS: ORAL_CAPSULE | ORAL | Status: DC

## 2015-06-16 NOTE — Discharge Instructions (Signed)
Please take his Librium as prescribed. Please do not drink alcohol while taking Librium. Please follow up with your primary care physician. Please follow-up with detox facilities as provided by [facility].   Alcohol Use Disorder Alcohol use disorder is a mental disorder. It is not a one-time incident of heavy drinking. Alcohol use disorder is the excessive and uncontrollable use of alcohol over time that leads to problems with functioning in one or more areas of daily living. People with this disorder risk harming themselves and others when they drink to excess. Alcohol use disorder also can cause other mental disorders, such as mood and anxiety disorders, and serious physical problems. People with alcohol use disorder often misuse other drugs.  Alcohol use disorder is common and widespread. Some people with this disorder drink alcohol to cope with or escape from negative life events. Others drink to relieve chronic pain or symptoms of mental illness. People with a family history of alcohol use disorder are at higher risk of losing control and using alcohol to excess.  Drinking too much alcohol can cause injury, accidents, and health problems. One drink can be too much when you are:  Working.  Pregnant or breastfeeding.  Taking medicines. Ask your doctor.  Driving or planning to drive. SYMPTOMS  Signs and symptoms of alcohol use disorder may include the following:   Consumption ofalcohol inlarger amounts or over a longer period of time than intended.  Multiple unsuccessful attempts to cutdown or control alcohol use.   A great deal of time spent obtaining alcohol, using alcohol, or recovering from the effects of alcohol (hangover).  A strong desire or urge to use alcohol (cravings).   Continued use of alcohol despite problems at work, school, or home because of alcohol use.   Continued use of alcohol despite problems in relationships because of alcohol use.  Continued use of alcohol in  situations when it is physically hazardous, such as driving a car.  Continued use of alcohol despite awareness of a physical or psychological problem that is likely related to alcohol use. Physical problems related to alcohol use can involve the brain, heart, liver, stomach, and intestines. Psychological problems related to alcohol use include intoxication, depression, anxiety, psychosis, delirium, and dementia.   The need for increased amounts of alcohol to achieve the same desired effect, or a decreased effect from the consumption of the same amount of alcohol (tolerance).  Withdrawal symptoms upon reducing or stopping alcohol use, or alcohol use to reduce or avoid withdrawal symptoms. Withdrawal symptoms include:  Racing heart.  Hand tremor.  Difficulty sleeping.  Nausea.  Vomiting.  Hallucinations.  Restlessness.  Seizures. DIAGNOSIS Alcohol use disorder is diagnosed through an assessment by your health care provider. Your health care provider may start by asking three or four questions to screen for excessive or problematic alcohol use. To confirm a diagnosis of alcohol use disorder, at least two symptoms must be present within a 41-month period. The severity of alcohol use disorder depends on the number of symptoms:  Mild--two or three.  Moderate--four or five.  Severe--six or more. Your health care provider may perform a physical exam or use results from lab tests to see if you have physical problems resulting from alcohol use. Your health care provider may refer you to a mental health professional for evaluation. TREATMENT  Some people with alcohol use disorder are able to reduce their alcohol use to low-risk levels. Some people with alcohol use disorder need to quit drinking alcohol. When necessary, mental  health professionals with specialized training in substance use treatment can help. Your health care provider can help you decide how severe your alcohol use disorder is  and what type of treatment you need. The following forms of treatment are available:   Detoxification. Detoxification involves the use of prescription medicines to prevent alcohol withdrawal symptoms in the first week after quitting. This is important for people with a history of symptoms of withdrawal and for heavy drinkers who are likely to have withdrawal symptoms. Alcohol withdrawal can be dangerous and, in severe cases, cause death. Detoxification is usually provided in a hospital or in-patient substance use treatment facility.  Counseling or talk therapy. Talk therapy is provided by substance use treatment counselors. It addresses the reasons people use alcohol and ways to keep them from drinking again. The goals of talk therapy are to help people with alcohol use disorder find healthy activities and ways to cope with life stress, to identify and avoid triggers for alcohol use, and to handle cravings, which can cause relapse.  Medicines.Different medicines can help treat alcohol use disorder through the following actions:  Decrease alcohol cravings.  Decrease the positive reward response felt from alcohol use.  Produce an uncomfortable physical reaction when alcohol is used (aversion therapy).  Support groups. Support groups are run by people who have quit drinking. They provide emotional support, advice, and guidance. These forms of treatment are often combined. Some people with alcohol use disorder benefit from intensive combination treatment provided by specialized substance use treatment centers. Both inpatient and outpatient treatment programs are available.   This information is not intended to replace advice given to you by your health care provider. Make sure you discuss any questions you have with your health care provider.   Document Released: 08/04/2004 Document Revised: 07/18/2014 Document Reviewed: 10/04/2012 Elsevier Interactive Patient Education [employer].

## 2015-06-16 NOTE — ED Notes (Signed)
Pt came to ER from home requesting detox from ETOH. Pt reports drinking vodka and beer with last vodka drink being at 1930 this evening. Pt reports that he has been drinking for 30 years.  Pt reports that when he stops drinking and goes into withdrawal that he has symptoms of tremor, N/V, Headache, sensitivity to light and noise and sometimes does blackout. Pt does report that he blacked out last Wed 06/10/15. Pt denies SI,HI, AH, VH and paranoia. Pt denies use of illicit drugs.  Pt resting on stretcher in 20H bed space at end of assessment.

## 2015-06-16 NOTE — ED Notes (Signed)

## 2015-06-16 NOTE — BH Specialist Note (Signed)
Mr. Devaney reports to the ED Due to alcohol intoxication.  He states that he went to [place] provided him with information that outlined he is to come to [facility] for a "Medical  Detox from Alcohol". He is then to transition to a two week residential program with [facility].  He is then to plan for a discharge to outpatient program that accepts [employer]. He denied having symptoms of depression or anxiety. He denied auditory or visual hallucinations.  He denied suicidal or homicidal ideation or intent. He reports an extensive history of alcohol use, extending almost 30 years.

## 2015-06-16 NOTE — ED Provider Notes (Signed)
[facility] Emergency Department Provider Note  Time seen: 8:48 PM  I have reviewed the triage vital signs and the nursing notes.   HISTORY  Chief Complaint Alcohol Problem    HPI Russell Rivas is a 59 y.o. male with a past medical history of hypertension, anxiety, depression, arthritis, COPD, alcohol abuse who presents the emergency department requesting detox. According to the patient he drinks approximately 1/5 of vodka and several beers per day. His last drink was approximately 1.5 hours ago. Patient states he is having episodes where he blacks out, does not recall what he is doing. For instance he states recently he threw a brand-new recliner out onto his front yard. Patient states his drinking is affecting his marriage, and he is afraid he is going to lose his wife, so he is wishing for detox. Denies any drug use. Denies any medical complaints besides lower back pain which she states is chronic.     Past Medical History  Diagnosis Date  . Hypertension   . Anxiety   . Depression   . Arthritis   . COPD (chronic obstructive pulmonary disease) (HCC)     NO inhalers  . Hypothyroidism   . Alcohol abuse     pt reports drinking at least one pint everyday.     There are no active problems to display for this patient.   Past Surgical History  Procedure Laterality Date  . Knee arthroscopy    . Appendectomy    . Shoulder arthroscopy    . Knee arthroscopy with medial menisectomy Right 11/26/2014    Procedure: KNEE ARTHROSCOPY WITH MEDIAL MENISECTOMY;  Surgeon: Leanor Kail, MD;  Location: [facility] ORS;  Service: Orthopedics;  Laterality: Right;    Current Outpatient Rx  Name  Route  Sig  Dispense  Refill  . Multiple Vitamins-Minerals (MULTIVITAMIN WITH MINERALS) tablet   Oral   Take 1 tablet by mouth daily.           Allergies Hydrocodone  History reviewed. No pertinent family history.  Social History Social History  Substance Use Topics  .  Smoking status: Current Every Day Smoker -- 1.00 packs/day for 30 years    Types: Cigarettes  . Smokeless tobacco: None  . Alcohol Use: Yes     Comment: 1 pint liquer per day.     Review of Systems Constitutional: Negative for fever. Cardiovascular: Negative for chest pain. Respiratory: Negative for shortness of breath. Gastrointestinal: Negative for abdominal pain Musculoskeletal:  Positive lower back pain. Chronic. Neurological: Negative for headache 10-point ROS otherwise negative.  ____________________________________________   PHYSICAL EXAM:  VITAL SIGNS: ED Triage Vitals  Enc Vitals Group     BP 06/16/15 2016 137/77 mmHg     Pulse Rate 06/16/15 2016 94     Resp 06/16/15 2016 18     Temp 06/16/15 2016 97.7 F (36.5 C)     Temp Source 06/16/15 2016 Oral     SpO2 06/16/15 2016 99 %     Weight --      Height --      Head Cir --      Peak Flow --      Pain Score 06/16/15 2016 8     Pain Loc --      Pain Edu? --      Excl. in [place]? --     Constitutional: Alert and oriented. Well appearing and in no distress. Eyes: Normal exam ENT   Head: Normocephalic and atraumatic.  Mouth/Throat: Mucous membranes are moist. Cardiovascular: Normal rate, regular rhythm. No murmur Respiratory: Normal respiratory effort without tachypnea nor retractions. Breath sounds are clear  Gastrointestinal: Soft and nontender. No distention.   Musculoskeletal: Nontender with normal range of motion in all extremities.  Neurologic:  Normal speech and language. No gross focal neurologic deficits  Skin:  Skin is warm, dry and intact.  Psychiatric: Mood and affect are normal. Speech and behavior are normal.   ____________________________________________   INITIAL IMPRESSION / ASSESSMENT AND PLAN / ED COURSE  Pertinent labs & imaging results that were available during my care of the patient were reviewed by me and considered in my medical decision making (see chart for  details).  Patient presents the emergency department wishing for alcohol detox. His last drink was approximately one hour ago. Patient states he has had "convulsions" from not drinking, but is not sure if that was a seizure. Patient states he went to [facility] and talked to one of the counselors who referred to the emergency department for alcohol detox. Patient denies SI or HI. Denies medical complaints. We will check labs, and consult TTS to see what if any resources are available to the patient at this time.  [facility]) has briefly spoken to the patient. They state is the patient's only complaint is alcohol detox there are not able to do an evaluation on the patient as they no longer refer out for alcohol detox. I have clarified with the patient, he confirms that he has never had a seizure with withdrawal. As the patient has private insurance he is not a candidate for RTS. I discussed the options with the patient, he is most comfortable with proceeding with a Librium taper, and he will begin contacting private detox facilities tomorrow, which [facility] has provided him. I discussed with the patient the dangers of drinking while taking Librium, the patient understands, he states he is serious about detoxing, and he will not be drinking while taking Librium.  ____________________________________________   FINAL CLINICAL IMPRESSION(S) / ED DIAGNOSES  alcohol detox   Harvest Dark, MD 06/16/15 2255

## 2015-06-16 NOTE — ED Notes (Addendum)
Pt presents to ED requesting detox from alcohol. Pt states his last drink was around 1930 today (vodka and beer). Pt states drinks vodka and beers everyday. Pt states he experience withdrawals when he stops drinking including tremors, blacks out, nausea/vomiting. Pt takes 1mg  ativan x2 day. Pt alerts and oriented x4 at this time. Pt denies SI/HI at this time.

## 2015-06-16 NOTE — ED Notes (Signed)
BEHAVIORAL HEALTH ROUNDING Patient sleeping: Yes.   Patient alert and oriented: not applicable Behavior appropriate: Yes.    Nutrition and fluids offered: No Toileting and hygiene offered: No Sitter present: q15 minute observations and security camera monitoring Law enforcement present: Yes [employer] 

## 2015-06-17 DIAGNOSIS — F1020 Alcohol dependence, uncomplicated: Secondary | ICD-10-CM | POA: Insufficient documentation

## 2015-06-17 DIAGNOSIS — D7589 Other specified diseases of blood and blood-forming organs: Secondary | ICD-10-CM | POA: Insufficient documentation

## 2015-06-17 LAB — URINALYSIS COMPLETE WITH MICROSCOPIC (ARMC ONLY)
Bacteria, UA: NONE SEEN
Bilirubin Urine: NEGATIVE
Glucose, UA: NEGATIVE mg/dL
Hgb urine dipstick: NEGATIVE
Ketones, ur: NEGATIVE mg/dL
Leukocytes, UA: NEGATIVE
Nitrite: NEGATIVE
Protein, ur: NEGATIVE mg/dL
Specific Gravity, Urine: 1.012 (ref 1.005–1.030)
Squamous Epithelial / LPF: NONE SEEN
WBC, UA: NONE SEEN WBC/hpf (ref 0–5)
pH: 5 (ref 5.0–8.0)

## 2015-08-21 ENCOUNTER — Other Ambulatory Visit: Payer: Self-pay | Admitting: Unknown Physician Specialty

## 2015-08-21 DIAGNOSIS — M25561 Pain in right knee: Secondary | ICD-10-CM

## 2015-08-28 ENCOUNTER — Ambulatory Visit
Admission: RE | Admit: 2015-08-28 | Discharge: 2015-08-28 | Disposition: A | Discharge status: Home / Self Care | Payer: Medicare Other | Source: Ambulatory Visit | Attending: Unknown Physician Specialty | Admitting: Unknown Physician Specialty

## 2015-08-28 DIAGNOSIS — Z9689 Presence of other specified functional implants: Secondary | ICD-10-CM | POA: Diagnosis not present

## 2015-08-28 DIAGNOSIS — S83231A Complex tear of medial meniscus, current injury, right knee, initial encounter: Secondary | ICD-10-CM | POA: Diagnosis not present

## 2015-08-28 DIAGNOSIS — M25461 Effusion, right knee: Secondary | ICD-10-CM | POA: Insufficient documentation

## 2015-08-28 DIAGNOSIS — M25561 Pain in right knee: Secondary | ICD-10-CM | POA: Diagnosis not present

## 2015-08-28 DIAGNOSIS — M1711 Unilateral primary osteoarthritis, right knee: Secondary | ICD-10-CM | POA: Diagnosis not present

## 2015-09-01 ENCOUNTER — Encounter
Admission: RE | Admit: 2015-09-01 | Discharge: 2015-09-01 | Disposition: A | Discharge status: Home / Self Care | Payer: Medicare Other | Source: Ambulatory Visit | Attending: Unknown Physician Specialty | Admitting: Unknown Physician Specialty

## 2015-09-01 DIAGNOSIS — Z79899 Other long term (current) drug therapy: Secondary | ICD-10-CM | POA: Diagnosis not present

## 2015-09-01 DIAGNOSIS — M1990 Unspecified osteoarthritis, unspecified site: Secondary | ICD-10-CM | POA: Diagnosis not present

## 2015-09-01 DIAGNOSIS — Z8249 Family history of ischemic heart disease and other diseases of the circulatory system: Secondary | ICD-10-CM | POA: Diagnosis not present

## 2015-09-01 DIAGNOSIS — Z888 Allergy status to other drugs, medicaments and biological substances status: Secondary | ICD-10-CM | POA: Diagnosis not present

## 2015-09-01 DIAGNOSIS — M2341 Loose body in knee, right knee: Secondary | ICD-10-CM | POA: Diagnosis present

## 2015-09-01 DIAGNOSIS — J449 Chronic obstructive pulmonary disease, unspecified: Secondary | ICD-10-CM | POA: Diagnosis not present

## 2015-09-01 DIAGNOSIS — E039 Hypothyroidism, unspecified: Secondary | ICD-10-CM | POA: Diagnosis not present

## 2015-09-01 DIAGNOSIS — F17200 Nicotine dependence, unspecified, uncomplicated: Secondary | ICD-10-CM | POA: Diagnosis not present

## 2015-09-01 DIAGNOSIS — F418 Other specified anxiety disorders: Secondary | ICD-10-CM | POA: Diagnosis not present

## 2015-09-01 DIAGNOSIS — I10 Essential (primary) hypertension: Secondary | ICD-10-CM | POA: Diagnosis not present

## 2015-09-01 DIAGNOSIS — M948X6 Other specified disorders of cartilage, lower leg: Secondary | ICD-10-CM | POA: Diagnosis not present

## 2015-09-01 DIAGNOSIS — Z885 Allergy status to narcotic agent status: Secondary | ICD-10-CM | POA: Diagnosis not present

## 2015-09-01 HISTORY — DX: Acute poliomyelitis, unspecified: A80.9

## 2015-09-01 LAB — DIFFERENTIAL
Basophils Absolute: 0 10*3/uL (ref 0–0.1)
Basophils Relative: 0 %
Eosinophils Absolute: 0.1 10*3/uL (ref 0–0.7)
Eosinophils Relative: 2 %
Lymphocytes Relative: 29 %
Lymphs Abs: 1.4 10*3/uL (ref 1.0–3.6)
Monocytes Absolute: 0.3 10*3/uL (ref 0.2–1.0)
Monocytes Relative: 7 %
Neutro Abs: 3 10*3/uL (ref 1.4–6.5)
Neutrophils Relative %: 62 %

## 2015-09-01 LAB — COMPREHENSIVE METABOLIC PANEL
ALT: 30 U/L (ref 17–63)
AST: 37 U/L (ref 15–41)
Albumin: 4.2 g/dL (ref 3.5–5.0)
Alkaline Phosphatase: 53 U/L (ref 38–126)
Anion gap: 7 (ref 5–15)
BUN: 11 mg/dL (ref 6–20)
CO2: 28 mmol/L (ref 22–32)
Calcium: 9.3 mg/dL (ref 8.9–10.3)
Chloride: 106 mmol/L (ref 101–111)
Creatinine, Ser: 0.61 mg/dL (ref 0.61–1.24)
GFR calc Af Amer: >60 mL/min (ref >60)
GFR calc non Af Amer: >60 mL/min (ref >60)
Glucose, Bld: 82 mg/dL (ref 65–99)
Potassium: 3.6 mmol/L (ref 3.5–5.1)
Sodium: 141 mmol/L (ref 135–145)
Total Bilirubin: 0.7 mg/dL (ref 0.3–1.2)
Total Protein: 7.3 g/dL (ref 6.5–8.1)

## 2015-09-01 LAB — CBC
HCT: 40.8 % (ref 40.0–52.0)
Hemoglobin: 14.1 g/dL (ref 13.0–18.0)
MCH: 32.5 pg (ref 26.0–34.0)
MCHC: 34.6 g/dL (ref 32.0–36.0)
MCV: 93.8 fL (ref 80.0–100.0)
Platelets: 166 10*3/uL (ref 150–440)
RBC: 4.34 MIL/uL — ABNORMAL LOW (ref 4.40–5.90)
RDW: 13 % (ref 11.5–14.5)
WBC: 4.9 10*3/uL (ref 3.8–10.6)

## 2015-09-01 NOTE — Patient Instructions (Signed)
  Your procedure is scheduled on: Wednesday Sep 02, 2015. Report to Same Day Surgery. To find out your arrival time please call [phone] between 1PM - 3PM today.  Remember: Instructions that are not followed completely may result in serious medical risk, up to and including death, or upon the discretion of your surgeon and anesthesiologist your surgery may need to be rescheduled.    _x___ 1. Do not eat food or drink liquids after midnight. No gum chewing or hard candies.     _x___ 2. No Alcohol for 24 hours before or after surgery.   ____ 3. Bring all medications with you on the day of surgery if instructed.    __x__ 4. Notify your doctor if there is any change in your medical condition     (cold, fever, infections).     Do not wear jewelry, make-up, hairpins, clips or nail polish.  Do not wear lotions, powders, or perfumes. You may wear deodorant.  Do not shave 48 hours prior to surgery. Men may shave face and neck.  Do not bring valuables to the hospital.    [facility] is not responsible for any belongings or valuables.               Contacts, dentures or bridgework may not be worn into surgery.  Leave your suitcase in the car. After surgery it may be brought to your room.  For patients admitted to the hospital, discharge time is determined by your treatment team.   Patients discharged the day of surgery will not be allowed to drive home.    Please read over the following fact sheets that you were given:   [facility] Preparing for Surgery  _x___ Take these medicines the morning of surgery with A SIP OF WATER:    1. chlordiazePOXIDE (LIBRIUM)   2. metoprolol succinate (TOPROL-XL)     ____ Fleet Enema (as directed)   _x___ Use CHG Soap as directed  ____ Use inhalers on the day of surgery  ____ Stop metformin 2 days prior to surgery    ____ Take 1/2 of usual insulin dose the night before surgery and none on the morning of  surgery.   ____ Stop  Coumadin/Plavix/aspirin on denies.  _x___ Stop Anti-inflammatories on denies.  OK to Tylenol or percocet if needed for pain.   ____ Stop supplements until after surgery.    ____ Bring C-Pap to the hospital.

## 2015-09-02 ENCOUNTER — Ambulatory Visit: Payer: Medicare Other | Admitting: Anesthesiology

## 2015-09-02 ENCOUNTER — Ambulatory Visit
Admission: RE | Admit: 2015-09-02 | Discharge: 2015-09-02 | Disposition: A | Discharge status: Home / Self Care | Payer: Medicare Other | Source: Ambulatory Visit | Attending: Unknown Physician Specialty | Admitting: Unknown Physician Specialty

## 2015-09-02 ENCOUNTER — Encounter: Payer: Self-pay | Admitting: *Deleted

## 2015-09-02 ENCOUNTER — Encounter
Admission: RE | Disposition: A | Discharge status: Home / Self Care | Payer: Self-pay | Source: Ambulatory Visit | Attending: Unknown Physician Specialty

## 2015-09-02 DIAGNOSIS — J449 Chronic obstructive pulmonary disease, unspecified: Secondary | ICD-10-CM | POA: Insufficient documentation

## 2015-09-02 DIAGNOSIS — E039 Hypothyroidism, unspecified: Secondary | ICD-10-CM | POA: Insufficient documentation

## 2015-09-02 DIAGNOSIS — M1990 Unspecified osteoarthritis, unspecified site: Secondary | ICD-10-CM | POA: Insufficient documentation

## 2015-09-02 DIAGNOSIS — Z79899 Other long term (current) drug therapy: Secondary | ICD-10-CM | POA: Insufficient documentation

## 2015-09-02 DIAGNOSIS — M948X6 Other specified disorders of cartilage, lower leg: Secondary | ICD-10-CM | POA: Diagnosis not present

## 2015-09-02 DIAGNOSIS — Z8249 Family history of ischemic heart disease and other diseases of the circulatory system: Secondary | ICD-10-CM | POA: Insufficient documentation

## 2015-09-02 DIAGNOSIS — F17200 Nicotine dependence, unspecified, uncomplicated: Secondary | ICD-10-CM | POA: Insufficient documentation

## 2015-09-02 DIAGNOSIS — I10 Essential (primary) hypertension: Secondary | ICD-10-CM | POA: Insufficient documentation

## 2015-09-02 DIAGNOSIS — Z888 Allergy status to other drugs, medicaments and biological substances status: Secondary | ICD-10-CM | POA: Insufficient documentation

## 2015-09-02 DIAGNOSIS — F418 Other specified anxiety disorders: Secondary | ICD-10-CM | POA: Insufficient documentation

## 2015-09-02 DIAGNOSIS — Z885 Allergy status to narcotic agent status: Secondary | ICD-10-CM | POA: Insufficient documentation

## 2015-09-02 HISTORY — PX: KNEE ARTHROSCOPY: SHX127

## 2015-09-02 SURGERY — ARTHROSCOPY, KNEE
Anesthesia: General | Site: Knee | Laterality: Right

## 2015-09-02 MED ORDER — MIDAZOLAM HCL 5 MG/5ML IJ SOLN
INTRAMUSCULAR | Status: DC | PRN
  Administered 2015-09-02: 2 mg via INTRAVENOUS

## 2015-09-02 MED ORDER — BUPIVACAINE HCL (PF) 0.5 % IJ SOLN
INTRAMUSCULAR | Status: DC | PRN
  Administered 2015-09-02: 20 mL

## 2015-09-02 MED ORDER — FENTANYL CITRATE (PF) 100 MCG/2ML IJ SOLN
25.0000 ug | INTRAMUSCULAR | Status: AC | PRN
  Administered 2015-09-02 (×6): 25 ug via INTRAVENOUS

## 2015-09-02 MED ORDER — GLYCOPYRROLATE 0.2 MG/ML IJ SOLN
INTRAMUSCULAR | Status: DC | PRN
  Administered 2015-09-02: 0.1 mg via INTRAVENOUS

## 2015-09-02 MED ORDER — OXYCODONE-ACETAMINOPHEN 5-325 MG PO TABS
ORAL_TABLET | ORAL | Status: AC
  Filled 2015-09-02: qty 1

## 2015-09-02 MED ORDER — BUPIVACAINE HCL (PF) 0.5 % IJ SOLN
INTRAMUSCULAR | Status: AC
  Filled 2015-09-02: qty 30

## 2015-09-02 MED ORDER — DEXAMETHASONE SODIUM PHOSPHATE 4 MG/ML IJ SOLN
INTRAMUSCULAR | Status: DC | PRN
  Administered 2015-09-02: 5 mg via INTRAVENOUS

## 2015-09-02 MED ORDER — OXYCODONE-ACETAMINOPHEN 5-325 MG PO TABS
1.0000 | ORAL_TABLET | ORAL | Status: DC | PRN
Start: 2015-09-02 — End: 2015-09-02
  Administered 2015-09-02: 1 via ORAL

## 2015-09-02 MED ORDER — FENTANYL CITRATE (PF) 100 MCG/2ML IJ SOLN
INTRAMUSCULAR | Status: AC
  Administered 2015-09-02: 25 ug via INTRAVENOUS
  Filled 2015-09-02: qty 2

## 2015-09-02 MED ORDER — FENTANYL CITRATE (PF) 100 MCG/2ML IJ SOLN
INTRAMUSCULAR | Status: DC | PRN
  Administered 2015-09-02: 100 ug via INTRAVENOUS

## 2015-09-02 MED ORDER — LACTATED RINGERS IV SOLN
INTRAVENOUS | Status: DC
  Administered 2015-09-02: 11:00:00 via INTRAVENOUS

## 2015-09-02 MED ORDER — FAMOTIDINE 20 MG PO TABS
20.0000 mg | ORAL_TABLET | Freq: Once | ORAL | Status: AC
  Administered 2015-09-02: 20 mg via ORAL

## 2015-09-02 MED ORDER — FAMOTIDINE 20 MG PO TABS
ORAL_TABLET | ORAL | Status: AC
  Administered 2015-09-02: 20 mg via ORAL
  Filled 2015-09-02: qty 1

## 2015-09-02 MED ORDER — ONDANSETRON HCL 4 MG/2ML IJ SOLN: 4.0000 mg | Freq: Once | INTRAMUSCULAR | Status: DC | PRN

## 2015-09-02 MED ORDER — ONDANSETRON HCL 4 MG/2ML IJ SOLN
INTRAMUSCULAR | Status: DC | PRN
  Administered 2015-09-02: 4 mg via INTRAVENOUS

## 2015-09-02 SURGICAL SUPPLY — 37 items
ARTHROWAND PARAGON T2 (SURGICAL WAND) ×3
BLADE ABRADER 4.5 (BLADE) ×3 IMPLANT
BLADE SHAVER 4.5X7 STR FR (MISCELLANEOUS) ×3 IMPLANT
BNDG ESMARK 6X12 TAN STRL LF (GAUZE/BANDAGES/DRESSINGS) ×3 IMPLANT
BUR ABRADER 4.0 W/FLUTE AQUA (MISCELLANEOUS) ×1 IMPLANT
BURR ABRADER 4.0 W/FLUTE AQUA (MISCELLANEOUS)
CHLORAPREP W/TINT 26ML (MISCELLANEOUS) ×3 IMPLANT
DRAPE LEGGINS SURG 28X43 STRL (DRAPES) ×3 IMPLANT
GAUZE SPONGE 4X4 12PLY STRL (GAUZE/BANDAGES/DRESSINGS) ×3 IMPLANT
GLOVE BIO SURGEON STRL SZ7.5 (GLOVE) ×3 IMPLANT
GLOVE BIO SURGEON STRL SZ8 (GLOVE) ×3 IMPLANT
GLOVE INDICATOR 8.0 STRL GRN (GLOVE) ×3 IMPLANT
GOWN STRL REUS W/ TWL LRG LVL3 (GOWN DISPOSABLE) ×1 IMPLANT
GOWN STRL REUS W/TWL LRG LVL3 (GOWN DISPOSABLE) ×3
GOWN STRL REUS W/TWL LRG LVL4 (GOWN DISPOSABLE) ×3 IMPLANT
IV LACTATED RINGER IRRG 3000ML (IV SOLUTION) ×6
IV LR IRRIG 3000ML ARTHROMATIC (IV SOLUTION) ×2 IMPLANT
KIT RM TURNOVER STRD PROC AR (KITS) ×3 IMPLANT
MANIFOLD 4PT FOR NEPTUNE1 (MISCELLANEOUS) ×3 IMPLANT
PACK ARTHROSCOPY KNEE (MISCELLANEOUS) ×3 IMPLANT
PADDING CAST 4IN STRL (MISCELLANEOUS)
PADDING CAST BLEND 4X4 STRL (MISCELLANEOUS) IMPLANT
SET TUBE SUCT SHAVER OUTFL 24K (TUBING) ×3 IMPLANT
SET TUBE TIP INTRA-ARTICULAR (MISCELLANEOUS) ×3 IMPLANT
SOL PREP PVP 2OZ (MISCELLANEOUS) ×3
SOLUTION PREP PVP 2OZ (MISCELLANEOUS) ×1 IMPLANT
SUT ETH BLK MONO 3 0 FS 1 12/B (SUTURE) ×3 IMPLANT
SUT ETHILON 3-0 FS-10 30 BLK (SUTURE) ×3
SUTURE EHLN 3-0 FS-10 30 BLK (SUTURE) ×1 IMPLANT
TAPE MICROFOAM 4IN (TAPE) ×3 IMPLANT
TUBING ARTHRO INFLOW-ONLY STRL (TUBING) ×3 IMPLANT
WAND 30 DEG SABER W/CORD (SURGICAL WAND) ×1 IMPLANT
WAND ARTHRO PARAGON T2 (SURGICAL WAND) IMPLANT
WAND COVAC 50 IFS (MISCELLANEOUS) IMPLANT
WAND HAND CNTRL MULTIVAC 50 (MISCELLANEOUS) IMPLANT
WAND HAND CNTRL MULTIVAC 90 (MISCELLANEOUS) IMPLANT
WRAP KNEE W/COLD PACKS 25.5X14 (SOFTGOODS) ×3 IMPLANT

## 2015-09-02 NOTE — H&P (Signed)
  H and P reviewed. No changes. Uploaded at later date. 

## 2015-09-02 NOTE — Anesthesia Preprocedure Evaluation (Signed)
Anesthesia Evaluation  Patient identified by MRN, date of birth, ID band  Reviewed: Allergy & Precautions, NPO status , Patient's Chart, lab work & pertinent test results, reviewed documented beta blocker date and time   Airway Mallampati: III       Dental  (+) Partial Upper, Chipped   Pulmonary COPD, Current Smoker,    Pulmonary exam normal breath sounds clear to auscultation       Cardiovascular hypertension, Pt. on medications and Pt. on home beta blockers Normal cardiovascular exam     Neuro/Psych PSYCHIATRIC DISORDERS Anxiety Depression    GI/Hepatic negative GI ROS, Neg liver ROS,   Endo/Other  Hypothyroidism   Renal/GU negative Renal ROS  negative genitourinary   Musculoskeletal  (+) Arthritis , Osteoarthritis,    Abdominal Normal abdominal exam  (+)   Peds negative pediatric ROS (+)  Hematology negative hematology ROS (+)   Anesthesia Other Findings   Reproductive/Obstetrics                             Anesthesia Physical Anesthesia Plan  ASA: III  Anesthesia Plan: General   Post-op Pain Management:    Induction: Intravenous  Airway Management Planned: LMA  Additional Equipment:   Intra-op Plan:   Post-operative Plan: Extubation in OR  Informed Consent: I have reviewed the patients History and Physical, chart, labs and discussed the procedure including the risks, benefits and alternatives for the proposed anesthesia with the patient or authorized representative who has indicated his/her understanding and acceptance.   Dental advisory given  Plan Discussed with: CRNA and Surgeon  Anesthesia Plan Comments:         Anesthesia Quick Evaluation

## 2015-09-02 NOTE — Op Note (Signed)
Patient: Russell Rivas, Russell Rivas  Preoperative diagnosis: Possible loose bodies right knee along with torn lateral meniscus and early degenerative arthritis  Postop diagnosis: Significant medial and lateral compartment chondral pathology  Operation: Arthroscopic microfracture of a lateral femoral chondral lesion plus coblation of medial and lateral compartment chondral lesions  Surgeon: Vilinda Flake, MD  Anesthesia: Gen.   History: Patient's had a long history of right knee pain.  The plain films revealed minimal narrowing of the medial and lateral compartments .  The patient had an MRI which revealed possible loose bodies in the medial compartment along with a torn lateral meniscus and medial and lateral compartment chondral pathology.The patient was scheduled for surgery due to persistent discomfort despite conservative treatment. Incidentally, the patient had a remote anterior cruciate ligament reconstruction on the right.  The patient was taken the operating room where satisfactory general anesthesia was achieved. A tourniquet and leg holder were was applied to the right thigh. A well leg support was applied to the nonoperative extremity. The right knee was prepped and draped in usual fashion for an arthroscopic procedure. An inflow cannula was introduced superomedially. The joint was distended with lactated Ringer's. Scope was introduced through an inferolateral puncture wound and a probe through an inferomedial puncture wound. Inspection of the medial compartment revealed  grade 3 chondral changes in the weightbearing portion of the medial femoral condyle. The patient's previous medial meniscal resection was noted. I went ahead and debrided the chondral pathology with a turbo whisker and then coblated the lesion with an ArthroCare Paragon wand. Inspection of the intercondylar notch revealed an intact posterior cruciate and somewhat attenuated anterior cruciate ligament graft.. Inspection of the the  lateral compartment revealed grade 3 chondral pathology involving the lateral femoral condyle and the lateral tibial plateau. There was no significant lateral meniscal tear. I did debride the chondral lesions in the lateral compartment with a turbo whisker. I then coblated  the lesions with an ArthroCare Paragon wand. The patient did have a very small  grade 4 chondral lesion in the medial third of the lateral femoral condyle. I microfractured this lesion after curetting the calcified cartilage layer.  Trochlear groove was inspected and appeared to be fairly smooth.  Retropatellar surface was smooth.  The patella seemed to track fairly well.  The instruments were removed from the joint at this time. The puncture wounds were closed with 3-0 nylon in vertical mattress fashion. I injected each puncture wound with several cc of half percent Marcaine without epinephrine. Betadine was applied the wounds followed by sterile dressing. An ice pack was applied to the right knee. The patient was awakened and transferred to the stretcher bed. The patient was taken to the recovery room in satisfactory condition.  The tourniquet was not inflated during the course of the procedure. Blood loss was negligible.

## 2015-09-02 NOTE — Anesthesia Procedure Notes (Signed)
Procedure Name: LMA Insertion Date/Time: 09/02/2015 12:44 PM Performed by: Kennon Holter Pre-anesthesia Checklist: Patient identified, Emergency Drugs available, Suction available, Patient being monitored and Timeout performed Patient Re-evaluated:Patient Re-evaluated prior to inductionOxygen Delivery Method: Circle system utilized Preoxygenation: Pre-oxygenation with 100% oxygen Intubation Type: IV induction LMA: LMA inserted LMA Size: 4.5 Number of attempts: 1 Placement Confirmation: breath sounds checked- equal and bilateral,  positive ETCO2 and ETT inserted through vocal cords under direct vision Tube secured with: Tape Dental Injury: Teeth and Oropharynx as per pre-operative assessment

## 2015-09-02 NOTE — Discharge Instructions (Signed)
[facility]  Kathrene Alu., M.D. [phone]   KNEE ARTHROSCOPY POST OPERATION INSTRUCTIONS:  PLEASE READ THESE INSTRUCTIONS ABOUT POST OPERATION CARE. THEY WILL ANSWER MOST OF YOUR QUESTIONS.  You have been given a prescription for pain. Please take as directed for pain.  You can walk, keeping the knee slightly stiff-avoid doing too much bending the first day. (if ACL reconstruction is performed, keep brace locked in extension when walking.)  You will use crutches or cane if needed. Can weight bear as tolerated  Plan to take three to four days off from work. You can resume work when you are comfortable. (This can be a week or more, depending on the type of work you do.)  To reduce pain and swelling, place one to two pillows under the knee the first two or three days when sitting or lying. An ice pack may be placed on top of the area over the dressing. Instructions for making homemade icepack are as follow:  Flexible homemade alcohol water ice pack  2 cups water  1 cup rubbing alcohol  food coloring for the blue tint (optional)  2 zip-top bags - gallon-size  Mix the water and alcohol together in one of your zip-top bags and add food coloring. Release as much air as possible and seal the bag. Place in freezer for at least 12 hours.   The small incisions in your knee are closed with nylon stitches. They will be removed in the office.  The bulky dressing may be removed in the third day after surgery. (If ACL surgery-DO NOT REMOVE BANDAGES). Put a waterproof band-aid over each stitch. Do not put any creams or ointments on wounds. You may shower at this time, but change waterproof band-aids after showering. KEEP INCISIONS CLEAN AND DRY UNTIL YOU RETURN TO THE OFFICE.  Sometimes the operative area remains somewhat painful and swollen for several weeks. This is usually nothing to worry about, but call if you have any excessive symptoms, especially  fever. It is not unusual to have a low grade fever of 99 degrees for the first few days. If persist after 3-4 days call the office. It is not uncommon for the pain to be a little worse on the third day after surgery.  Begin doing gentle exercises right away. They will be limited by the amount of pain and swelling you have.  Exercising will reduce the swelling, increase motion, and prevent muscle weakness. Exercises: Straight leg raising and gentle knee bending.  Take 81 milligram aspirin twice a day for 2 weeks after meals or milk. This along with elevation will help reduce the possibility of phlebitis in your operated leg.  Avoid strenuous athletics for a minimum of 4 to 6 weeks after arthroscopic surgery (approximately five months if ACL surgery).  If the surgery included ACL reconstruction the brace that is supplied to the extremity post surgery is to be locked in extension when you are asleep and is to be locked in extension when you are ambulating. It can be unlocked for exercises or sitting.  Keep your post surgery appointment that has been made for you. If you do not remember the date call [phone]. Your follow up appointment should be between 7-10 days.       AMBULATORY SURGERY  DISCHARGE INSTRUCTIONS  The drugs that you were given will stay in your system until tomorrow so for the next 24 hours you should not: Drive an automobile Make any legal decisions  Drink any alcoholic beverage You may resume regular meals tomorrow.  Today it is better to start with liquids and gradually work up to solid foods. You may eat anything you prefer, but it is better to start with liquids, then soup and crackers, and gradually work up to solid foods. Please notify your doctor immediately if you have any unusual bleeding, trouble breathing, redness and pain at the surgery site, drainage, fever, or pain not relieved by medication.  Additional Instructions: Please contact your physician with any  problems or Same Day Surgery at [phone], Monday through Friday 6 am to 4 pm, or [place] at [facility] number at [phone].

## 2015-09-02 NOTE — Transfer of Care (Signed)
Immediate Anesthesia Transfer of Care Note  Patient: Russell Rivas  Procedure(s) Performed: Procedure(s): ARTHROSCOPY KNEE, debridement, microfracture (Right)  Patient Location: PACU  Anesthesia Type:General  Level of Consciousness: awake, alert  and oriented  Airway & Oxygen Therapy: Patient Spontanous Breathing  Post-op Assessment: Report given to RN and Post -op Vital signs reviewed and stable  Post vital signs: Reviewed and stable  Last Vitals:  Filed Vitals:   09/02/15 1002 09/02/15 1408  BP: 130/90   Pulse: 92   Temp: 37.3 C 36.9 C  Resp: 16     Complications: No apparent anesthesia complications

## 2015-09-03 ENCOUNTER — Encounter: Payer: Self-pay | Admitting: Unknown Physician Specialty

## 2015-09-04 NOTE — Anesthesia Postprocedure Evaluation (Signed)
Anesthesia Post Note  Patient: Russell Rivas  Procedure(s) Performed: Procedure(s) (LRB): ARTHROSCOPY KNEE, debridement, microfracture (Right)  Patient location during evaluation: PACU Level of consciousness: awake and alert and oriented Pain management: pain level controlled Vital Signs Assessment: post-procedure vital signs reviewed and stable Respiratory status: spontaneous breathing Cardiovascular status: blood pressure returned to baseline Anesthetic complications: no    Last Vitals:  Filed Vitals:   09/02/15 1540 09/02/15 1600  BP: 113/76 115/69  Pulse: 86 80  Temp:    Resp: 16 16    Last Pain:  Filed Vitals:   09/03/15 0830  PainSc: 0-No pain                 Candela Krul

## 2015-09-09 ENCOUNTER — Ambulatory Visit: Payer: Medicare Other

## 2015-10-01 ENCOUNTER — Other Ambulatory Visit
Admission: RE | Admit: 2015-10-01 | Discharge: 2015-10-01 | Disposition: A | Discharge status: Home / Self Care | Payer: Medicare Other | Source: Ambulatory Visit | Attending: Cardiovascular Disease | Admitting: Cardiovascular Disease

## 2015-10-01 DIAGNOSIS — Z01812 Encounter for preprocedural laboratory examination: Secondary | ICD-10-CM | POA: Diagnosis present

## 2015-10-01 LAB — CREATININE, SERUM
Creatinine, Ser: 0.78 mg/dL (ref 0.61–1.24)
GFR calc Af Amer: >60 mL/min (ref >60)
GFR calc non Af Amer: >60 mL/min (ref >60)

## 2015-10-01 LAB — BUN: BUN: 12 mg/dL (ref 6–20)

## 2015-10-01 LAB — POTASSIUM: Potassium: 4.3 mmol/L (ref 3.5–5.1)

## 2015-10-01 LAB — HEMOGLOBIN AND HEMATOCRIT, BLOOD
HCT: 43.6 % (ref 40.0–52.0)
Hemoglobin: 15.2 g/dL (ref 13.0–18.0)

## 2015-10-02 ENCOUNTER — Encounter
Admission: RE | Disposition: A | Discharge status: Home / Self Care | Payer: Self-pay | Source: Ambulatory Visit | Attending: Cardiovascular Disease

## 2015-10-02 ENCOUNTER — Encounter: Payer: Self-pay | Admitting: *Deleted

## 2015-10-02 ENCOUNTER — Ambulatory Visit
Admission: RE | Admit: 2015-10-02 | Discharge: 2015-10-02 | Disposition: A | Discharge status: Home / Self Care | Payer: Medicare Other | Source: Ambulatory Visit | Attending: Cardiovascular Disease | Admitting: Cardiovascular Disease

## 2015-10-02 DIAGNOSIS — R079 Chest pain, unspecified: Secondary | ICD-10-CM | POA: Insufficient documentation

## 2015-10-02 DIAGNOSIS — F17200 Nicotine dependence, unspecified, uncomplicated: Secondary | ICD-10-CM | POA: Diagnosis not present

## 2015-10-02 DIAGNOSIS — Z79899 Other long term (current) drug therapy: Secondary | ICD-10-CM | POA: Diagnosis not present

## 2015-10-02 DIAGNOSIS — I340 Nonrheumatic mitral (valve) insufficiency: Secondary | ICD-10-CM | POA: Insufficient documentation

## 2015-10-02 DIAGNOSIS — R9431 Abnormal electrocardiogram [ECG] [EKG]: Secondary | ICD-10-CM | POA: Insufficient documentation

## 2015-10-02 DIAGNOSIS — Z8249 Family history of ischemic heart disease and other diseases of the circulatory system: Secondary | ICD-10-CM | POA: Diagnosis not present

## 2015-10-02 DIAGNOSIS — K219 Gastro-esophageal reflux disease without esophagitis: Secondary | ICD-10-CM | POA: Insufficient documentation

## 2015-10-02 DIAGNOSIS — Z7982 Long term (current) use of aspirin: Secondary | ICD-10-CM | POA: Insufficient documentation

## 2015-10-02 DIAGNOSIS — Z7902 Long term (current) use of antithrombotics/antiplatelets: Secondary | ICD-10-CM | POA: Diagnosis not present

## 2015-10-02 DIAGNOSIS — I2510 Atherosclerotic heart disease of native coronary artery without angina pectoris: Secondary | ICD-10-CM | POA: Diagnosis not present

## 2015-10-02 HISTORY — PX: CARDIAC CATHETERIZATION: SHX172

## 2015-10-02 SURGERY — LEFT HEART CATH AND CORONARY ANGIOGRAPHY
Anesthesia: Moderate Sedation | Laterality: Left

## 2015-10-02 MED ORDER — SODIUM CHLORIDE 0.9% FLUSH: 3.0000 mL | Freq: Two times a day (BID) | INTRAVENOUS | Status: DC

## 2015-10-02 MED ORDER — IOPAMIDOL (ISOVUE-300) INJECTION 61%
INTRAVENOUS | Status: DC | PRN
  Administered 2015-10-02: 100 mL via INTRA_ARTERIAL

## 2015-10-02 MED ORDER — ONDANSETRON HCL 4 MG/2ML IJ SOLN: 4.0000 mg | Freq: Four times a day (QID) | INTRAMUSCULAR | Status: DC | PRN

## 2015-10-02 MED ORDER — SODIUM CHLORIDE 0.9 % WEIGHT BASED INFUSION: 1.0000 mL/kg/h | INTRAVENOUS | Status: DC

## 2015-10-02 MED ORDER — MIDAZOLAM HCL 2 MG/2ML IJ SOLN
INTRAMUSCULAR | Status: DC | PRN
  Administered 2015-10-02 (×2): 1 mg via INTRAVENOUS

## 2015-10-02 MED ORDER — SODIUM CHLORIDE 0.9% FLUSH: 3.0000 mL | INTRAVENOUS | Status: DC | PRN

## 2015-10-02 MED ORDER — SODIUM CHLORIDE 0.9 % IV SOLN
INTRAVENOUS | Status: DC
  Administered 2015-10-02: 09:00:00 via INTRAVENOUS

## 2015-10-02 MED ORDER — ACETAMINOPHEN 325 MG PO TABS: 650.0000 mg | ORAL_TABLET | ORAL | Status: DC | PRN

## 2015-10-02 MED ORDER — MIDAZOLAM HCL 2 MG/2ML IJ SOLN
INTRAMUSCULAR | Status: AC
  Filled 2015-10-02: qty 2

## 2015-10-02 MED ORDER — FENTANYL CITRATE (PF) 100 MCG/2ML IJ SOLN
INTRAMUSCULAR | Status: AC
  Filled 2015-10-02: qty 2

## 2015-10-02 MED ORDER — FENTANYL CITRATE (PF) 100 MCG/2ML IJ SOLN
INTRAMUSCULAR | Status: DC | PRN
  Administered 2015-10-02 (×2): 50 ug via INTRAVENOUS

## 2015-10-02 MED ORDER — HEPARIN (PORCINE) IN NACL 2-0.9 UNIT/ML-% IJ SOLN
INTRAMUSCULAR | Status: AC
  Filled 2015-10-02: qty 500

## 2015-10-02 MED ORDER — SODIUM CHLORIDE 0.9 % IV SOLN: 250.0000 mL | INTRAVENOUS | Status: DC | PRN

## 2015-10-02 SURGICAL SUPPLY — 10 items
CATH INFINITI 5FR ANG PIGTAIL (CATHETERS) ×3 IMPLANT
CATH INFINITI 5FR JL4 (CATHETERS) ×3 IMPLANT
CATH INFINITI JR4 5F (CATHETERS) ×3 IMPLANT
DEVICE CLOSURE MYNXGRIP 5F (Vascular Products) ×2 IMPLANT
KIT MANI 3VAL PERCEP (MISCELLANEOUS) ×3 IMPLANT
NDL PERC 18GX7CM (NEEDLE) ×1 IMPLANT
NEEDLE PERC 18GX7CM (NEEDLE) ×3 IMPLANT
PACK CARDIAC CATH (CUSTOM PROCEDURE TRAY) ×3 IMPLANT
SHEATH PINNACLE 5F 10CM (SHEATH) ×3 IMPLANT
WIRE EMERALD 3MM-J .035X150CM (WIRE) ×3 IMPLANT

## 2015-10-02 NOTE — Discharge Instructions (Signed)

## 2015-10-03 ENCOUNTER — Encounter: Payer: Self-pay | Admitting: Emergency Medicine

## 2015-10-03 ENCOUNTER — Emergency Department
Admission: EM | Admit: 2015-10-03 | Discharge: 2015-10-03 | Disposition: A | Discharge status: Home / Self Care | Payer: Medicare Other | Attending: Emergency Medicine | Admitting: Emergency Medicine

## 2015-10-03 DIAGNOSIS — R079 Chest pain, unspecified: Secondary | ICD-10-CM | POA: Diagnosis present

## 2015-10-03 DIAGNOSIS — Z9889 Other specified postprocedural states: Secondary | ICD-10-CM | POA: Insufficient documentation

## 2015-10-03 DIAGNOSIS — I10 Essential (primary) hypertension: Secondary | ICD-10-CM | POA: Diagnosis not present

## 2015-10-03 DIAGNOSIS — F17210 Nicotine dependence, cigarettes, uncomplicated: Secondary | ICD-10-CM | POA: Insufficient documentation

## 2015-10-03 LAB — COMPREHENSIVE METABOLIC PANEL
ALT: 17 U/L (ref 17–63)
AST: 26 U/L (ref 15–41)
Albumin: 4.5 g/dL (ref 3.5–5.0)
Alkaline Phosphatase: 56 U/L (ref 38–126)
Anion gap: 10 (ref 5–15)
BUN: 10 mg/dL (ref 6–20)
CO2: 26 mmol/L (ref 22–32)
Calcium: 9.6 mg/dL (ref 8.9–10.3)
Chloride: 100 mmol/L — ABNORMAL LOW (ref 101–111)
Creatinine, Ser: 0.66 mg/dL (ref 0.61–1.24)
GFR calc Af Amer: >60 mL/min (ref >60)
GFR calc non Af Amer: >60 mL/min (ref >60)
Glucose, Bld: 124 mg/dL — ABNORMAL HIGH (ref 65–99)
Potassium: 4 mmol/L (ref 3.5–5.1)
Sodium: 136 mmol/L (ref 135–145)
Total Bilirubin: 0.7 mg/dL (ref 0.3–1.2)
Total Protein: 7.7 g/dL (ref 6.5–8.1)

## 2015-10-03 LAB — TROPONIN I: Troponin I: <0.03 ng/mL (ref <0.031)

## 2015-10-03 LAB — CBC
HCT: 40.1 % (ref 40.0–52.0)
Hemoglobin: 14.3 g/dL (ref 13.0–18.0)
MCH: 34 pg (ref 26.0–34.0)
MCHC: 35.8 g/dL (ref 32.0–36.0)
MCV: 95.1 fL (ref 80.0–100.0)
Platelets: 140 10*3/uL — ABNORMAL LOW (ref 150–440)
RBC: 4.22 MIL/uL — ABNORMAL LOW (ref 4.40–5.90)
RDW: 15.1 % — ABNORMAL HIGH (ref 11.5–14.5)
WBC: 7.3 10*3/uL (ref 3.8–10.6)

## 2015-10-03 NOTE — ED Notes (Signed)
Per family pt has had a problem with alcohol recently and usually drinks a pint of liquor per day, has had no intake of alcohol today.

## 2015-10-03 NOTE — ED Notes (Signed)
Chest pain began 1230 today. Heart cath yesterday, no stents. Had procedure here.

## 2015-10-06 ENCOUNTER — Encounter: Payer: Self-pay | Admitting: Cardiovascular Disease

## 2015-10-13 ENCOUNTER — Telehealth: Payer: Self-pay | Admitting: Gastroenterology

## 2015-10-13 NOTE — Telephone Encounter (Signed)
Patient has called and would like to be scheduled for a colonoscopy. i have advised him that we will call and do a triage over the phone and schedule it at that time.

## 2015-10-13 NOTE — Telephone Encounter (Signed)
triage

## 2015-10-27 ENCOUNTER — Other Ambulatory Visit: Payer: Self-pay

## 2015-11-04 ENCOUNTER — Telehealth: Payer: Self-pay

## 2015-11-04 ENCOUNTER — Other Ambulatory Visit: Payer: Self-pay

## 2015-11-04 NOTE — Telephone Encounter (Signed)
Gastroenterology Pre-Procedure Review  Request Date: 12/08/15 Requesting Physician: Dr. Nicky Pugh  PATIENT REVIEW QUESTIONS: The patient responded to the following health history questions as indicated:    1. Are you having any GI issues? no 2. Do you have a personal history of Polyps? no 3. Do you have a family history of Colon Cancer or Polyps? no 4. Diabetes Mellitus? no 5. Joint replacements in the past 12 months?no 6. Major health problems in the past 3 months?no 7. Any artificial heart valves, MVP, or defibrillator?no    MEDICATIONS & ALLERGIES:    Patient reports the following regarding taking any anticoagulation/antiplatelet therapy:   Plavix, Coumadin, Eliquis, Xarelto, Lovenox, Pradaxa, Brilinta, or Effient? no Aspirin? no  Patient confirms/reports the following medications:  Current Outpatient Prescriptions  Medication Sig Dispense Refill  . chlordiazePOXIDE (LIBRIUM) 10 MG capsule Day 1-2: Take 1 tablet PO Q6H Day 3-4: Take 1 tablet PO Q8H Day 5-6: Take 1 tablet PO Q12H Day 7-8: Take 1 tablet Daily. (Patient not taking: Reported on 10/02/2015) 20 capsule 0  . metoprolol succinate (TOPROL-XL) 25 MG 24 hr tablet Take 25 mg by mouth every morning.    . Multiple Vitamins-Minerals (MULTIVITAMIN WITH MINERALS) tablet Take 1 tablet by mouth every morning.     Marland Kitchen oxyCODONE-acetaminophen (PERCOCET/ROXICET) 5-325 MG tablet Take 1 tablet by mouth every 4 (four) hours as needed for severe pain.    . pregabalin (LYRICA) 150 MG capsule Take 150 mg by mouth.     No current facility-administered medications for this visit.    Patient confirms/reports the following allergies:  Allergies  Allergen Reactions  . Hydrocodone Itching    No orders of the defined types were placed in this encounter.    AUTHORIZATION INFORMATION Primary Insurance: 1D#: Group #:  Secondary Insurance: 1D#: Group #:  SCHEDULE INFORMATION: Date: 12/08/15 Time: Location: [place]

## 2015-11-04 NOTE — Telephone Encounter (Signed)
Pt scheduled for screening colonoscopy at [facility] on5/30/17. Please precert

## 2015-12-04 ENCOUNTER — Encounter: Payer: Self-pay | Admitting: *Deleted

## 2015-12-08 ENCOUNTER — Encounter
Admission: RE | Disposition: A | Discharge status: Home / Self Care | Payer: Self-pay | Source: Ambulatory Visit | Attending: Gastroenterology

## 2015-12-08 ENCOUNTER — Encounter: Payer: Self-pay | Admitting: *Deleted

## 2015-12-08 ENCOUNTER — Ambulatory Visit
Admission: RE | Admit: 2015-12-08 | Discharge: 2015-12-08 | Disposition: A | Discharge status: Home / Self Care | Payer: Medicare Other | Source: Ambulatory Visit | Attending: Gastroenterology | Admitting: Gastroenterology

## 2015-12-08 ENCOUNTER — Ambulatory Visit: Payer: Medicare Other | Admitting: Certified Registered"

## 2015-12-08 DIAGNOSIS — K921 Melena: Secondary | ICD-10-CM | POA: Insufficient documentation

## 2015-12-08 DIAGNOSIS — Z86010 Personal history of colonic polyps: Secondary | ICD-10-CM | POA: Insufficient documentation

## 2015-12-08 DIAGNOSIS — E039 Hypothyroidism, unspecified: Secondary | ICD-10-CM | POA: Insufficient documentation

## 2015-12-08 DIAGNOSIS — Z1211 Encounter for screening for malignant neoplasm of colon: Secondary | ICD-10-CM | POA: Insufficient documentation

## 2015-12-08 DIAGNOSIS — F329 Major depressive disorder, single episode, unspecified: Secondary | ICD-10-CM | POA: Diagnosis not present

## 2015-12-08 DIAGNOSIS — Z79899 Other long term (current) drug therapy: Secondary | ICD-10-CM | POA: Diagnosis not present

## 2015-12-08 DIAGNOSIS — M1990 Unspecified osteoarthritis, unspecified site: Secondary | ICD-10-CM | POA: Diagnosis not present

## 2015-12-08 DIAGNOSIS — J449 Chronic obstructive pulmonary disease, unspecified: Secondary | ICD-10-CM | POA: Diagnosis not present

## 2015-12-08 DIAGNOSIS — F17210 Nicotine dependence, cigarettes, uncomplicated: Secondary | ICD-10-CM | POA: Diagnosis not present

## 2015-12-08 DIAGNOSIS — K5730 Diverticulosis of large intestine without perforation or abscess without bleeding: Secondary | ICD-10-CM | POA: Insufficient documentation

## 2015-12-08 DIAGNOSIS — K621 Rectal polyp: Secondary | ICD-10-CM | POA: Insufficient documentation

## 2015-12-08 DIAGNOSIS — F1010 Alcohol abuse, uncomplicated: Secondary | ICD-10-CM | POA: Diagnosis not present

## 2015-12-08 DIAGNOSIS — K219 Gastro-esophageal reflux disease without esophagitis: Secondary | ICD-10-CM | POA: Diagnosis not present

## 2015-12-08 DIAGNOSIS — K640 First degree hemorrhoids: Secondary | ICD-10-CM | POA: Diagnosis not present

## 2015-12-08 DIAGNOSIS — I10 Essential (primary) hypertension: Secondary | ICD-10-CM | POA: Insufficient documentation

## 2015-12-08 DIAGNOSIS — F419 Anxiety disorder, unspecified: Secondary | ICD-10-CM | POA: Insufficient documentation

## 2015-12-08 HISTORY — DX: Gastro-esophageal reflux disease without esophagitis: K21.9

## 2015-12-08 HISTORY — PX: COLONOSCOPY WITH PROPOFOL: SHX5780

## 2015-12-08 SURGERY — COLONOSCOPY WITH PROPOFOL
Anesthesia: General

## 2015-12-08 MED ORDER — PROPOFOL 500 MG/50ML IV EMUL
INTRAVENOUS | Status: DC | PRN
  Administered 2015-12-08: 175 ug/kg/min via INTRAVENOUS

## 2015-12-08 MED ORDER — SODIUM CHLORIDE 0.9 % IV SOLN
INTRAVENOUS | Status: DC
  Administered 2015-12-08: 1000 mL via INTRAVENOUS

## 2015-12-08 MED ORDER — LIDOCAINE HCL (CARDIAC) 20 MG/ML IV SOLN
INTRAVENOUS | Status: DC | PRN
  Administered 2015-12-08: 60 mg via INTRAVENOUS

## 2015-12-08 MED ORDER — PROPOFOL 10 MG/ML IV BOLUS
INTRAVENOUS | Status: DC | PRN
  Administered 2015-12-08: 90 mg via INTRAVENOUS

## 2015-12-08 NOTE — Transfer of Care (Signed)
Immediate Anesthesia Transfer of Care Note  Patient: Russell Rivas  Procedure(s) Performed: Procedure(s): COLONOSCOPY WITH PROPOFOL (N/A)  Patient Location: PACU  Anesthesia Type:General  Level of Consciousness: responds to stimulation, sleeping  Airway & Oxygen Therapy: Patient Spontanous Breathing and Patient connected to nasal cannula oxygen  Post-op Assessment: Report given to RN and Post -op Vital signs reviewed and stable  Post vital signs: Reviewed and stable  Last Vitals:  Filed Vitals:   12/08/15 0821 12/08/15 0908  BP: 133/79 113/76  Pulse: 93 88  Temp: 36.6 C   Resp: 16 17    Last Pain:  Filed Vitals:   12/08/15 0909  PainSc: 5          Complications: No apparent anesthesia complications

## 2015-12-08 NOTE — Anesthesia Preprocedure Evaluation (Addendum)
Anesthesia Evaluation  Patient identified by MRN, date of birth, ID band Patient awake    Reviewed: Allergy & Precautions, NPO status , Patient's Chart, lab work & pertinent test results, reviewed documented beta blocker date and time   Airway Mallampati: II  TM Distance: >3 FB     Dental  (+) Chipped, Partial Upper   Pulmonary COPD, Current Smoker,           Cardiovascular hypertension, Pt. on medications and Pt. on home beta blockers      Neuro/Psych PSYCHIATRIC DISORDERS Anxiety Depression  Neuromuscular disease    GI/Hepatic GERD  ,  Endo/Other  Hypothyroidism   Renal/GU      Musculoskeletal  (+) Arthritis ,   Abdominal   Peds  Hematology   Anesthesia Other Findings ETOH. nO CARDIAC SYMPTOMS. ekg CHECKED AND ok , NO CHANGE.  Reproductive/Obstetrics                            Anesthesia Physical Anesthesia Plan  ASA: III  Anesthesia Plan: General   Post-op Pain Management:    Induction: Intravenous  Airway Management Planned: Nasal Cannula  Additional Equipment:   Intra-op Plan:   Post-operative Plan:   Informed Consent: I have reviewed the patients History and Physical, chart, labs and discussed the procedure including the risks, benefits and alternatives for the proposed anesthesia with the patient or authorized representative who has indicated his/her understanding and acceptance.     Plan Discussed with: CRNA  Anesthesia Plan Comments:         Anesthesia Quick Evaluation

## 2015-12-08 NOTE — Anesthesia Postprocedure Evaluation (Signed)
Anesthesia Post Note  Patient: Russell Rivas  Procedure(s) Performed: Procedure(s) (LRB): COLONOSCOPY WITH PROPOFOL (N/A)  Patient location during evaluation: Endoscopy Anesthesia Type: General Level of consciousness: awake and alert Pain management: pain level controlled Vital Signs Assessment: post-procedure vital signs reviewed and stable Respiratory status: spontaneous breathing, nonlabored ventilation, respiratory function stable and patient connected to nasal cannula oxygen Cardiovascular status: blood pressure returned to baseline and stable Postop Assessment: no signs of nausea or vomiting Anesthetic complications: no    Last Vitals:  Filed Vitals:   12/08/15 0928 12/08/15 0938  BP: 124/73 128/85  Pulse: 91 84  Temp:    Resp: 18 18    Last Pain:  Filed Vitals:   12/08/15 0939  PainSc: 5                  Fayelynn Distel S

## 2015-12-08 NOTE — Op Note (Signed)
[facility] Gastroenterology Patient Name: Russell Rivas Procedure Date: 12/08/2015 8:44 AM MRN: [id number] Account #: [id number] Date of Birth: 1956/04/26 Admit Type: Outpatient Age: 60 Room: [facility] ENDO ROOM 4 Gender: Male Note Status: Finalized Procedure:            Colonoscopy Indications:          Hematochezia Providers:            Lucilla Lame, MD Referring MD:         Mikeal Hawthorne. Brynda Greathouse, MD (Referring MD) Medicines:            Propofol per Anesthesia Complications:        No immediate complications. Procedure:            Pre-Anesthesia Assessment:                       - Prior to the procedure, a History and Physical was                        performed, and patient medications and allergies were                        reviewed. The patient's tolerance of previous                        anesthesia was also reviewed. The risks and benefits of                        the procedure and the sedation options and risks were                        discussed with the patient. All questions were                        answered, and informed consent was obtained. Prior                        Anticoagulants: The patient has taken no previous                        anticoagulant or antiplatelet agents. ASA Grade                        Assessment: II - A patient with mild systemic disease.                        After reviewing the risks and benefits, the patient was                        deemed in satisfactory condition to undergo the                        procedure.                       After obtaining informed consent, the colonoscope was                        passed under direct vision. Throughout the procedure,  the patient's blood pressure, pulse, and oxygen                        saturations were monitored continuously. The                        Colonoscope was introduced through the anus and                        advanced to the the cecum,  identified by appendiceal                        orifice and ileocecal valve. The colonoscopy was                        performed without difficulty. The patient tolerated the                        procedure well. The quality of the bowel preparation                        was excellent. Findings:      The perianal and digital rectal examinations were normal.      A 3 mm polyp was found in the rectum. The polyp was sessile. The polyp       was removed with a cold biopsy forceps. Resection and retrieval were       complete.      Non-bleeding internal hemorrhoids were found during perianal exam. The       hemorrhoids were Grade I (internal hemorrhoids that do not prolapse).      Multiple small-mouthed diverticula were found in the sigmoid colon. Impression:           - One 3 mm polyp in the rectum, removed with a cold                        biopsy forceps. Resected and retrieved.                       - Non-bleeding internal hemorrhoids.                       - Diverticulosis in the sigmoid colon. Recommendation:       - Repeat colonoscopy in 5 years for surveillance. Procedure Code(s):    --- Professional ---                       [phone], Colonoscopy, flexible; with biopsy, single or                        multiple Diagnosis Code(s):    --- Professional ---                       K92.1, Melena (includes Hematochezia)                       K62.1, Rectal polyp                       K64.0, First degree hemorrhoids CPT copyright 2016 American Medical Association. All rights reserved. The codes documented in this report are preliminary and upon  coder review may  be revised to meet current compliance requirements. Lucilla Lame, MD 12/08/2015 9:07:26 AM This report has been signed electronically. Number of Addenda: 0 Note Initiated On: 12/08/2015 8:44 AM Scope Withdrawal Time: 0 hours 11 minutes 10 seconds  Total Procedure Duration: 0 hours 15 minutes 35 seconds       [facility]

## 2015-12-08 NOTE — H&P (Addendum)
[name]  [address], [place], [name] [address] Phone: [phone] Fax : [phone]  Primary Care Physician:  Marden Noble, MD Primary Gastroenterologist:  Dr. Allen Norris  Pre-Procedure History & Physical: HPI:  Russell Rivas is a 60 y.o. male is here for a screening colonoscopy.   Past Medical History  Diagnosis Date  . Hypertension   . Anxiety   . Depression   . Arthritis   . COPD (chronic obstructive pulmonary disease) (HCC)     NO inhalers  . Alcohol abuse     pt reports drinking at least one pint everyday.   . Hypothyroidism     does not take medication at this time.  has in the past  . Polio     as a child, caused poblems in right knee.    Past Surgical History  Procedure Laterality Date  . Appendectomy    . Shoulder arthroscopy Bilateral     one in January and one in June  . Knee arthroscopy with medial menisectomy Right 11/26/2014    Procedure: KNEE ARTHROSCOPY WITH MEDIAL MENISECTOMY;  Surgeon: Leanor Kail, MD;  Location: [facility] ORS;  Service: Orthopedics;  Laterality: Right;  . Knee ligament reconstruction Right 1959    pt did not have ligaments, ligaments were made and implanted.  . Knee surgery Left 1969    growth bone removed.  . Knee arthroscopy Right 09/02/2015    Procedure: ARTHROSCOPY KNEE, debridement, microfracture;  Surgeon: Leanor Kail, MD;  Location: [facility] ORS;  Service: Orthopedics;  Laterality: Right;  . Cardiac catheterization Left 10/02/2015    Procedure: Left Heart Cath and Coronary Angiography;  Surgeon: Dionisio David, MD;  Location: [place] [facility];  Service: Cardiovascular;  Laterality: Left;    Prior to Admission medications   Medication Sig Start Date End Date Taking? Authorizing Provider  levothyroxine (SYNTHROID, LEVOTHROID) 25 MCG tablet Take 25 mcg by mouth daily before breakfast.   Yes Historical Provider, MD  LORazepam (ATIVAN) 0.5 MG tablet Take 0.5 mg by mouth every 8 (eight) hours.   Yes  Historical Provider, MD  chlordiazePOXIDE (LIBRIUM) 10 MG capsule Day 1-2: Take 1 tablet PO Q6H Day 3-4: Take 1 tablet PO Q8H Day 5-6: Take 1 tablet PO Q12H Day 7-8: Take 1 tablet Daily. 06/16/15   Harvest Dark, MD  metoprolol succinate (TOPROL-XL) 25 MG 24 hr tablet Take 25 mg by mouth every morning.    Historical Provider, MD  Multiple Vitamins-Minerals (MULTIVITAMIN WITH MINERALS) tablet Take 1 tablet by mouth every morning.     Historical Provider, MD  oxyCODONE-acetaminophen (PERCOCET/ROXICET) 5-325 MG tablet Take 1 tablet by mouth every 4 (four) hours as needed for severe pain. Reported on 11/04/2015    Historical Provider, MD  pregabalin (LYRICA) 150 MG capsule Take 150 mg by mouth.    Historical Provider, MD    Allergies as of 11/04/2015 - Review Complete 11/04/2015  Allergen Reaction Noted  . Hydrocodone Itching 02/27/2015    History reviewed. No pertinent family history.  Social History   Social History  . Marital Status: Married    Spouse Name: N/A  . Number of Children: N/A  . Years of Education: N/A   Occupational History  . Not on file.   Social History Main Topics  . Smoking status: Current Every Day Smoker -- 1.00 packs/day for 30 years    Types: Cigarettes  . Smokeless tobacco: Not on file  . Alcohol Use: 34.8 oz/week    58  Cans of beer per week     Comment: 1 pint liquer per week  . Drug Use: No  . Sexual Activity: Not on file   Other Topics Concern  . Not on file   Social History Narrative    Review of Systems: See HPI, otherwise negative ROS  Physical Exam: There were no vitals taken for this visit. General:   Alert,  pleasant and cooperative in NAD Head:  Normocephalic and atraumatic. Neck:  Supple; no masses or thyromegaly. Lungs:  Clear throughout to auscultation.    Heart:  Regular rate and rhythm. Abdomen:  Soft, nontender and nondistended. Normal bowel sounds, without guarding, and without rebound.   Neurologic:  Alert and  oriented  x4;  grossly normal neurologically.  Impression/Plan: Russell Rivas is now here to undergo a colonoscopy for history of polyps and rectal bleeding.  Risks, benefits, and alternatives regarding colonoscopy have been reviewed with the patient.  Questions have been answered.  All parties agreeable.

## 2015-12-08 NOTE — Anesthesia Postprocedure Evaluation (Signed)
Anesthesia Post Note  Patient: Russell Rivas  Procedure(s) Performed: Procedure(s) (LRB): COLONOSCOPY WITH PROPOFOL (N/A)  Patient location during evaluation: PACU Anesthesia Type: General Level of consciousness: awake, oriented and awake and alert Pain management: pain level controlled Vital Signs Assessment: post-procedure vital signs reviewed and stable Respiratory status: nonlabored ventilation, spontaneous breathing and respiratory function stable Cardiovascular status: stable Anesthetic complications: no    Last Vitals:  Filed Vitals:   12/08/15 0928 12/08/15 0938  BP: 124/73 128/85  Pulse: 91 84  Temp:    Resp: 18 18    Last Pain:  Filed Vitals:   12/08/15 0939  PainSc: [place]

## 2015-12-09 ENCOUNTER — Encounter: Payer: Self-pay | Admitting: Gastroenterology

## 2015-12-09 LAB — SURGICAL PATHOLOGY

## 2015-12-10 ENCOUNTER — Encounter: Payer: Self-pay | Admitting: Gastroenterology

## 2015-12-21 ENCOUNTER — Telehealth: Payer: Self-pay | Admitting: Gastroenterology

## 2015-12-21 NOTE — Telephone Encounter (Signed)
Please call patients wife, Russell Rivas. Her husband, Griffith, had a colonoscopy on May 30th with Dr Allen Norris. He is having diarrhea, multiple times daily, for approx. 3-4 weeks and now has blood in his stool. She would like to speak with you and an urgent appointment if possible. Thanks.

## 2015-12-22 ENCOUNTER — Emergency Department: Payer: Medicare Other

## 2015-12-22 ENCOUNTER — Encounter: Payer: Self-pay | Admitting: Emergency Medicine

## 2015-12-22 ENCOUNTER — Emergency Department
Admission: EM | Admit: 2015-12-22 | Discharge: 2015-12-22 | Disposition: A | Discharge status: Home / Self Care | Payer: Medicare Other | Attending: Student | Admitting: Student

## 2015-12-22 DIAGNOSIS — Z79899 Other long term (current) drug therapy: Secondary | ICD-10-CM | POA: Diagnosis not present

## 2015-12-22 DIAGNOSIS — R0789 Other chest pain: Secondary | ICD-10-CM | POA: Insufficient documentation

## 2015-12-22 DIAGNOSIS — M1990 Unspecified osteoarthritis, unspecified site: Secondary | ICD-10-CM | POA: Diagnosis not present

## 2015-12-22 DIAGNOSIS — F17210 Nicotine dependence, cigarettes, uncomplicated: Secondary | ICD-10-CM | POA: Diagnosis not present

## 2015-12-22 DIAGNOSIS — I10 Essential (primary) hypertension: Secondary | ICD-10-CM | POA: Diagnosis not present

## 2015-12-22 DIAGNOSIS — Z79891 Long term (current) use of opiate analgesic: Secondary | ICD-10-CM | POA: Insufficient documentation

## 2015-12-22 DIAGNOSIS — J449 Chronic obstructive pulmonary disease, unspecified: Secondary | ICD-10-CM | POA: Insufficient documentation

## 2015-12-22 DIAGNOSIS — R079 Chest pain, unspecified: Secondary | ICD-10-CM | POA: Diagnosis present

## 2015-12-22 DIAGNOSIS — F329 Major depressive disorder, single episode, unspecified: Secondary | ICD-10-CM | POA: Insufficient documentation

## 2015-12-22 DIAGNOSIS — E039 Hypothyroidism, unspecified: Secondary | ICD-10-CM | POA: Insufficient documentation

## 2015-12-22 LAB — CBC
HCT: 43.3 % (ref 40.0–52.0)
Hemoglobin: 15 g/dL (ref 13.0–18.0)
MCH: 34.8 pg — ABNORMAL HIGH (ref 26.0–34.0)
MCHC: 34.6 g/dL (ref 32.0–36.0)
MCV: 100.6 fL — ABNORMAL HIGH (ref 80.0–100.0)
Platelets: 183 10*3/uL (ref 150–440)
RBC: 4.3 MIL/uL — ABNORMAL LOW (ref 4.40–5.90)
RDW: 13.4 % (ref 11.5–14.5)
WBC: 7 10*3/uL (ref 3.8–10.6)

## 2015-12-22 LAB — BASIC METABOLIC PANEL
Anion gap: 14 (ref 5–15)
BUN: 7 mg/dL (ref 6–20)
CO2: 23 mmol/L (ref 22–32)
Calcium: 9.5 mg/dL (ref 8.9–10.3)
Chloride: 101 mmol/L (ref 101–111)
Creatinine, Ser: 0.54 mg/dL — ABNORMAL LOW (ref 0.61–1.24)
GFR calc Af Amer: >60 mL/min (ref >60)
GFR calc non Af Amer: >60 mL/min (ref >60)
Glucose, Bld: 102 mg/dL — ABNORMAL HIGH (ref 65–99)
Potassium: 3.3 mmol/L — ABNORMAL LOW (ref 3.5–5.1)
Sodium: 138 mmol/L (ref 135–145)

## 2015-12-22 LAB — TROPONIN I: Troponin I: <0.03 ng/mL (ref <0.031)

## 2015-12-22 LAB — FIBRIN DERIVATIVES D-DIMER (ARMC ONLY): Fibrin derivatives D-dimer (ARMC): 935 — ABNORMAL HIGH (ref 0–499)

## 2015-12-22 MED ORDER — OXYCODONE HCL 5 MG PO TABS: 5.0000 mg | ORAL_TABLET | Freq: Four times a day (QID) | ORAL | Status: DC | PRN

## 2015-12-22 MED ORDER — DIAZEPAM 5 MG PO TABS
5.0000 mg | ORAL_TABLET | Freq: Once | ORAL | Status: AC
  Administered 2015-12-22: 5 mg via ORAL
  Filled 2015-12-22: qty 1

## 2015-12-22 MED ORDER — NAPROXEN 500 MG PO TABS: 500.0000 mg | ORAL_TABLET | Freq: Two times a day (BID) | ORAL | Status: AC

## 2015-12-22 MED ORDER — SODIUM CHLORIDE 0.9 % IV BOLUS (SEPSIS)
1000.0000 mL | Freq: Once | INTRAVENOUS | Status: AC
  Administered 2015-12-22: 1000 mL via INTRAVENOUS

## 2015-12-22 MED ORDER — ONDANSETRON HCL 4 MG/2ML IJ SOLN
4.0000 mg | Freq: Once | INTRAMUSCULAR | Status: AC
  Administered 2015-12-22: 4 mg via INTRAVENOUS
  Filled 2015-12-22: qty 2

## 2015-12-22 MED ORDER — IOPAMIDOL (ISOVUE-370) INJECTION 76%
100.0000 mL | Freq: Once | INTRAVENOUS | Status: AC | PRN
  Administered 2015-12-22: 100 mL via INTRAVENOUS

## 2015-12-22 MED ORDER — MORPHINE SULFATE (PF) 4 MG/ML IV SOLN
4.0000 mg | Freq: Once | INTRAVENOUS | Status: AC
  Administered 2015-12-22: 4 mg via INTRAVENOUS
  Filled 2015-12-22: qty 1

## 2015-12-22 MED ORDER — KETOROLAC TROMETHAMINE 30 MG/ML IJ SOLN
15.0000 mg | Freq: Once | INTRAMUSCULAR | Status: AC
  Administered 2015-12-22: 15 mg via INTRAVENOUS
  Filled 2015-12-22: qty 1

## 2015-12-22 MED ORDER — CYCLOBENZAPRINE HCL 5 MG PO TABS: 5.0000 mg | ORAL_TABLET | Freq: Three times a day (TID) | ORAL | Status: DC | PRN

## 2015-12-22 MED ORDER — ASPIRIN 81 MG PO CHEW
324.0000 mg | CHEWABLE_TABLET | Freq: Once | ORAL | Status: AC
  Administered 2015-12-22: 324 mg via ORAL
  Filled 2015-12-22: qty 4

## 2015-12-22 NOTE — ED Notes (Signed)
C/O chest pain x 3 days.  Worsening over the past 3 days.

## 2015-12-22 NOTE — ED Provider Notes (Addendum)
[facility] Emergency Department Provider Note   ____________________________________________  Time seen: Approximately 6:43 PM  I have reviewed the triage vital signs and the nursing notes.   HISTORY  Chief Complaint Chest Pain    HPI Russell Rivas is a 60 y.o. male with hypertension, COPD, GERD, alcohol abuse, nonocclusive coronary artery disease with only 40% LAD stenosis on catheterization in March 2017 who presents for evaluation of 3 days of constant aching left sided chest pain, gradual onset, constant, worse with deep inspiration and movement and associated with mild shortness of breath. No fevers or chills. No vomiting. He has had chronic diarrhea for several weeks.   Past Medical History  Diagnosis Date  . Hypertension   . Anxiety   . Depression   . Arthritis   . COPD (chronic obstructive pulmonary disease) (HCC)     NO inhalers  . Alcohol abuse     pt reports drinking at least one pint everyday.   . Hypothyroidism     does not take medication at this time.  has in the past  . Polio     as a child, caused poblems in right knee.  Marland Kitchen GERD (gastroesophageal reflux disease)     Patient Active Problem List   Diagnosis Date Noted  . Blood in stool   . Rectal polyp   . First degree hemorrhoids   . Alcohol dependence ([place]) 06/17/2015  . Increased MCV 06/17/2015  . Current tear of meniscus 12/05/2014  . 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 Gonalgia 09/17/2014  . Lumbar radiculopathy 04/15/2013  . Complete rotator cuff rupture of left shoulder 09/04/2012    Past Surgical History  Procedure Laterality Date  . Appendectomy    . Shoulder arthroscopy Bilateral     one in January and one in June  . Knee arthroscopy with medial menisectomy Right 11/26/2014    Procedure: KNEE ARTHROSCOPY WITH MEDIAL MENISECTOMY;  Surgeon: Leanor Kail, MD;  Location: [facility] ORS;  Service: Orthopedics;  Laterality: Right;  . Knee ligament  reconstruction Right 1959    pt did not have ligaments, ligaments were made and implanted.  . Knee surgery Left 1969    growth bone removed.  . Knee arthroscopy Right 09/02/2015    Procedure: ARTHROSCOPY KNEE, debridement, microfracture;  Surgeon: Leanor Kail, MD;  Location: [facility] ORS;  Service: Orthopedics;  Laterality: Right;  . Cardiac catheterization Left 10/02/2015    Procedure: Left Heart Cath and Coronary Angiography;  Surgeon: Dionisio David, MD;  Location: [place] [facility];  Service: Cardiovascular;  Laterality: Left;  . Colonoscopy with propofol N/A 12/08/2015    Procedure: COLONOSCOPY WITH PROPOFOL;  Surgeon: Lucilla Lame, MD;  Location: [facility] ENDOSCOPY;  Service: Endoscopy;  Laterality: N/A;    Current Outpatient Rx  Name  Route  Sig  Dispense  Refill  . chlordiazePOXIDE (LIBRIUM) 10 MG capsule      Day 1-2: Take 1 tablet PO Q6H Day 3-4: Take 1 tablet PO Q8H Day 5-6: Take 1 tablet PO Q12H Day 7-8: Take 1 tablet Daily.   20 capsule   0   . cyclobenzaprine (FLEXERIL) 5 MG tablet   Oral   Take 1 tablet (5 mg total) by mouth 3 (three) times daily as needed for muscle spasms.   12 tablet   0   . levothyroxine (SYNTHROID, LEVOTHROID) 25 MCG tablet   Oral   Take 25 mcg by mouth daily before breakfast.         .  LORazepam (ATIVAN) 0.5 MG tablet   Oral   Take 0.5 mg by mouth every 8 (eight) hours.         . metoprolol succinate (TOPROL-XL) 25 MG 24 hr tablet   Oral   Take 25 mg by mouth every morning.         . Multiple Vitamins-Minerals (MULTIVITAMIN WITH MINERALS) tablet   Oral   Take 1 tablet by mouth every morning.          . naproxen (NAPROSYN) 500 MG tablet   Oral   Take 1 tablet (500 mg total) by mouth 2 (two) times daily with a meal.   10 tablet   0   . oxyCODONE (ROXICODONE) 5 MG immediate release tablet   Oral   Take 1 tablet (5 mg total) by mouth every 6 (six) hours as needed for breakthrough pain (Take as prescribed if you are still having  pain despite taking naproxen and Flexeril as prescribed.). Do not drive while taking this medication.   6 tablet   0   . oxyCODONE-acetaminophen (PERCOCET/ROXICET) 5-325 MG tablet   Oral   Take 1 tablet by mouth every 4 (four) hours as needed for severe pain. Reported on 11/04/2015         . pregabalin (LYRICA) 150 MG capsule   Oral   Take 150 mg by mouth.           Allergies Hydrocodone  No family history on file.  Social History Social History  Substance Use Topics  . Smoking status: Current Every Day Smoker -- 1.00 packs/day for 30 years    Types: Cigarettes  . Smokeless tobacco: None  . Alcohol Use: 34.8 oz/week    58 Cans of beer per week     Comment: 1 pint liquer per week    Review of Systems Constitutional: No fever/chills Eyes: No visual changes. ENT: No sore throat. Cardiovascular: + chest pain. Respiratory: + shortness of breath. Gastrointestinal: No abdominal pain.  No nausea, no vomiting.  No diarrhea.  No constipation. Genitourinary: Negative for dysuria. Musculoskeletal: Negative for back pain. Skin: Negative for rash. Neurological: Negative for headaches, focal weakness or numbness.  10-point ROS otherwise negative.  ____________________________________________   PHYSICAL EXAM:  Filed Vitals:   12/22/15 1835 12/22/15 1900 12/22/15 1930  BP: 132/88 123/82 127/80  Pulse: 88 84 86  Temp: 98.2 F (36.8 C)    TempSrc: Oral    Resp: 18 11 17   Height: 5\' 6"  (1.676 m)    Weight: 190 lb (86.183 kg)    SpO2: 98% 95% 94%    VITAL SIGNS: ED Triage Vitals  Enc Vitals Group     BP 12/22/15 1835 132/88 mmHg     Pulse Rate 12/22/15 1835 88     Resp 12/22/15 1835 18     Temp 12/22/15 1835 98.2 F (36.8 C)     Temp Source 12/22/15 1835 Oral     SpO2 12/22/15 1835 98 %     Weight 12/22/15 1835 190 lb (86.183 kg)     Height 12/22/15 1835 5\' 6"  (1.676 m)     Head Cir --      Peak Flow --      Pain Score 12/22/15 1833 7     Pain Loc --       Pain Edu? --      Excl. in [place]? --     Constitutional: Alert and oriented. Well appearing and in no acute distress. Eyes: Conjunctivae are  normal. PERRL. EOMI. Head: Atraumatic. Nose: No congestion/rhinnorhea. Mouth/Throat: Mucous membranes are moist.  Oropharynx non-erythematous. Neck: No stridor. Elbow without meningismus. Cardiovascular: Normal rate, regular rhythm. Grossly normal heart sounds.  Good peripheral circulation. Respiratory: Normal respiratory effort.  No retractions. Lungs CTAB. Gastrointestinal: Soft and nontender. No distention. No CVA tenderness. Genitourinary: deferred Musculoskeletal: No lower extremity tenderness nor edema.  No joint effusions. No calf swelling/tenderness or asymmetry. Reproducible point tenderness to palpation in the left lateral chest wall, when I push on the area, the patient grimaces and attempts to away from the noxious stimulus. Neurologic:  Normal speech and language. No gross focal neurologic deficits are appreciated. No gait instability. Skin:  Skin is warm, dry and intact. No rash noted. Psychiatric: Mood and affect are normal. Speech and behavior are normal.  ____________________________________________   LABS (all labs ordered are listed, but only abnormal results are displayed)  Labs Reviewed  BASIC METABOLIC PANEL - Abnormal; Notable for the following:    Potassium 3.3 (*)    Glucose, Bld 102 (*)    Creatinine, Ser 0.54 (*)    All other components within normal limits  CBC - Abnormal; Notable for the following:    RBC 4.30 (*)    MCV 100.6 (*)    MCH 34.8 (*)    All other components within normal limits  FIBRIN DERIVATIVES D-DIMER ([facility] ONLY) - Abnormal; Notable for the following:    Fibrin derivatives D-dimer ([facility]) 935 (*)    All other components within normal limits  TROPONIN I   ____________________________________________  EKG  ED ECG REPORT I, Joanne Gavel, the attending physician, personally viewed and  interpreted this ECG.   Date: 12/22/2015  EKG Time: 18:35  Rate: 96  Rhythm: normal sinus rhythm  Axis: normal  Intervals:none  ST&T Change: No acute ST elevation. T-wave in V2. EKG unchanged from 10/03/2015.  ____________________________________________  RADIOLOGY  CXR IMPRESSION: No acute disease.  CTA chest FINDINGS: There is no evidence of pulmonary embolus.  Minimal bibasilar atelectasis is noted. The lungs are otherwise clear. No pleural effusion or pneumothorax is seen. There is no evidence of significant focal consolidation, pleural effusion or pneumothorax. No masses are identified; no abnormal focal contrast enhancement is seen.  Diffuse coronary artery calcifications are seen. The mediastinum is otherwise unremarkable. No mediastinal lymphadenopathy is seen. No pericardial effusion is identified. The great vessels are grossly unremarkable in appearance. No axillary lymphadenopathy is seen. The visualized portions of the thyroid gland are unremarkable in appearance.  The visualized portions of the liver and spleen are unremarkable. The visualized portions of the pancreas, gallbladder, stomach, adrenal glands and kidneys are within normal limits.  No acute osseous abnormalities are seen. Postoperative change is noted at the right humeral head.  Review of the MIP images confirms the above findings.  IMPRESSION: 1. No evidence of pulmonary embolus. 2. Minimal bibasilar atelectasis noted. Lungs otherwise clear. 3. Diffuse coronary artery calcifications seen.  ____________________________________________   PROCEDURES  Procedure(s) performed: None  Critical Care performed: No  ____________________________________________   INITIAL IMPRESSION / ASSESSMENT AND PLAN / ED COURSE  Pertinent labs & imaging results that were available during my care of the patient were reviewed by me and considered in my medical decision making (see chart for  details).  Russell Rivas is a 60 y.o. male with hypertension, COPD, GERD, alcohol abuse, nonocclusive coronary artery disease with 40% LAD stenosis on catheterization in March 2017, no stent/intervention who presents for evaluation of 3 days of  constant aching left sided chest pain. On exam, he is really well-appearing and in no acute distress, vital signs stable, he is afebrile. He does have what appears to be reproducible tenderness to palpation in the left lateral chest wall and I suspect musculoskeletal chest pain however given his pleuritic component and age, we'll obtain d-dimer. EKG is unchanged from prior, we'll obtain any cardiac labs, treat his pain, obtain chest x-ray and reassess for disposition.  ----------------------------------------- 9:09 PM on 12/22/2015 ----------------------------------------- The patient appears comfortable, he is sitting up in bed eating peanut butter and crackers. Troponin negative and pain has been ongoing/constant for 3 days, I doubt that this represents ACS. Additionally he had a cath less than 3 months ago showing mild, nonocclusive single vessel disease. EKG unchanged. CBC and CMP generally unremarkable. D-dimer was elevated at 935 and CTA of chest was obtained which is negative for PE. Discussed symptomatic treatment with anti-inflammatories and muscle relaxers, will dc with #6 roxicodone tabs for breakthrough pain.  discussed need for close PCP follow-up, return precautions and he is comfortable with the discharge plan. DC home.  ____________________________________________   FINAL CLINICAL IMPRESSION(S) / ED DIAGNOSES  Final diagnoses:  Acute chest wall pain      NEW MEDICATIONS STARTED DURING THIS VISIT:  New Prescriptions   CYCLOBENZAPRINE (FLEXERIL) 5 MG TABLET    Take 1 tablet (5 mg total) by mouth 3 (three) times daily as needed for muscle spasms.   NAPROXEN (NAPROSYN) 500 MG TABLET    Take 1 tablet (500 mg total) by mouth 2 (two) times  daily with a meal.   OXYCODONE (ROXICODONE) 5 MG IMMEDIATE RELEASE TABLET    Take 1 tablet (5 mg total) by mouth every 6 (six) hours as needed for breakthrough pain (Take as prescribed if you are still having pain despite taking naproxen and Flexeril as prescribed.). Do not drive while taking this medication.     Note:  This document was prepared using Dragon voice recognition software and may include unintentional dictation errors.    Joanne Gavel, MD 12/22/15 2112  Joanne Gavel, MD 12/22/15 2115  Joanne Gavel, MD 12/22/15 2119

## 2015-12-22 NOTE — Telephone Encounter (Signed)
Spoke with pt regarding his diarrhea. Pt stated he has been having 10 to 12 bowel movements daily for a couple of weeks. I asked him if he mentioned this to Dr. Allen Norris before his colonoscopy on 12/08/15 and he said no. I advised him Dr. Allen Norris could have taken some biopsies if he would have known. Advised him to try OTC imodium and increase his fiber to see if this will work. Pt is to call me back if this doesn't work and we can move to the next step.

## 2016-01-27 ENCOUNTER — Telehealth: Payer: Self-pay | Admitting: Gastroenterology

## 2016-01-27 NOTE — Telephone Encounter (Signed)
Please see my previous note from pt. He stated he was having these bowel movements prior to colonoscopy but didn't mention this to you. I had recommend Imodium and to increase fiber in June. He was to call back if this didn't help. Please advise. Pathology didn't show any cause for diarrhea. He has been having diarrhea like this since May.

## 2016-01-27 NOTE — Telephone Encounter (Signed)
Please call patients wife, Butch Penny. Patient had a colonoscopy on 5/30. He has been having bowel movements/loose stool - 7-12 times a day. Also experiencing abdominal pain. Would like to speak with you about what the next step should be.

## 2016-01-28 NOTE — Telephone Encounter (Signed)
We have never seen this patient for this problem. He was seen for rectal bleeding. Have the patient follow-up in the office.

## 2016-01-29 ENCOUNTER — Other Ambulatory Visit: Payer: Self-pay

## 2016-01-29 NOTE — Telephone Encounter (Signed)
Appointment has been made for patient 

## 2016-02-01 ENCOUNTER — Other Ambulatory Visit: Payer: Self-pay

## 2016-02-01 ENCOUNTER — Ambulatory Visit (INDEPENDENT_AMBULATORY_CARE_PROVIDER_SITE_OTHER): Payer: Medicare Other | Admitting: Gastroenterology

## 2016-02-01 ENCOUNTER — Encounter: Payer: Self-pay | Admitting: Gastroenterology

## 2016-02-01 VITALS — BP 129/70 | HR 93 | Temp 99.1°F | Ht 66.0 in | Wt 184.0 lb

## 2016-02-01 DIAGNOSIS — K591 Functional diarrhea: Secondary | ICD-10-CM

## 2016-02-01 MED ORDER — DIPHENOXYLATE-ATROPINE 2.5-0.025 MG PO TABS: 1.0000 | ORAL_TABLET | Freq: Four times a day (QID) | ORAL | 3 refills | Status: DC | PRN

## 2016-02-01 MED ORDER — DICYCLOMINE HCL 10 MG PO CAPS: 10.0000 mg | ORAL_CAPSULE | Freq: Three times a day (TID) | ORAL | 3 refills | Status: DC

## 2016-02-01 NOTE — Progress Notes (Signed)
Primary Care Physician: Marden Noble, MD  Primary Gastroenterologist:  Dr. Lucilla Lame  Chief Complaint  Patient presents with  . Diarrhea    HPI: Russell Rivas is a 60 y.o. male here After having a colonoscopy for rectal bleeding.  The patient reports that his rectal bleeding is not bothering him anymore but he has had diarrhea.  The patient reports that his diarrhea has been going on for many years but has gotten worse in the last 6 months.  The patient also reports that he is having 7 bowel movements a day.  The patient is also having abdominal cramps.  There is no report of any diarrhea when he sleeps.  There is also no report of any unexplained weight loss.  The patient has been a heavy drinker in the past and states that since today's his birthday he will not drink anymore despite being in rehabilitation in the past and  Drinking 8-12 beers a day.  The patient does report that he had one beer today because he did not want through away the remaining stalk of beer he had at home.  His MCV is elevated but he has a normal hemoglobin.  The patient denies any dairy intake and states he  Has not found any food that is causing him to have the diarrhea.  Current Outpatient Prescriptions  Medication Sig Dispense Refill  . ibuprofen (ADVIL,MOTRIN) 800 MG tablet Take by mouth.    . levothyroxine (SYNTHROID, LEVOTHROID) 25 MCG tablet Take 25 mcg by mouth daily before breakfast.    . LORazepam (ATIVAN) 0.5 MG tablet Take 0.5 mg by mouth every 8 (eight) hours.    . metoprolol succinate (TOPROL-XL) 25 MG 24 hr tablet Take 25 mg by mouth every morning.    . Multiple Vitamins-Minerals (MULTIVITAMIN WITH MINERALS) tablet Take 1 tablet by mouth every morning.     . naproxen (NAPROSYN) 500 MG tablet Take 1 tablet (500 mg total) by mouth 2 (two) times daily with a meal. 10 tablet 0  . chlordiazePOXIDE (LIBRIUM) 10 MG capsule Day 1-2: Take 1 tablet PO Q6H Day 3-4: Take 1 tablet PO Q8H Day 5-6: Take 1  tablet PO Q12H Day 7-8: Take 1 tablet Daily. (Patient not taking: Reported on 02/01/2016) 20 capsule 0  . dicyclomine (BENTYL) 10 MG capsule Take 1 capsule (10 mg total) by mouth 4 (four) times daily -  before meals and at bedtime. 120 capsule 3  . diphenoxylate-atropine (LOMOTIL) 2.5-0.025 MG tablet Take 1 tablet by mouth 4 (four) times daily as needed for diarrhea or loose stools. 30 tablet 3  . oxyCODONE (ROXICODONE) 5 MG immediate release tablet Take 1 tablet (5 mg total) by mouth every 6 (six) hours as needed for breakthrough pain (Take as prescribed if you are still having pain despite taking naproxen and Flexeril as prescribed.). Do not drive while taking this medication. (Patient not taking: Reported on 02/01/2016) 6 tablet 0  . oxyCODONE-acetaminophen (PERCOCET/ROXICET) 5-325 MG tablet Take 1 tablet by mouth every 4 (four) hours as needed for severe pain. Reported on 11/04/2015    . pregabalin (LYRICA) 150 MG capsule Take 150 mg by mouth.     No current facility-administered medications for this visit.     Allergies as of 02/01/2016 - Review Complete 02/01/2016  Allergen Reaction Noted  . Hydrocodone Itching 02/27/2015    ROS:  General: Negative for anorexia, weight loss, fever, chills, fatigue, weakness. ENT: Negative for hoarseness, difficulty swallowing , nasal congestion.  CV: Negative for chest pain, angina, palpitations, dyspnea on exertion, peripheral edema.  Respiratory: Negative for dyspnea at rest, dyspnea on exertion, cough, sputum, wheezing.  GI: See history of present illness. GU:  Negative for dysuria, hematuria, urinary incontinence, urinary frequency, nocturnal urination.  Endo: Negative for unusual weight change.    Physical Examination:   BP 129/70   Pulse 93   Temp 99.1 F (37.3 C) (Oral)   Ht 5\' 6"  (1.676 m)   Wt 184 lb (83.5 kg)   BMI 29.70 kg/m   General: Well-nourished, well-developed in no acute distress.  Eyes: No icterus. Conjunctivae pink. Mouth:  Oropharyngeal mucosa moist and pink , no lesions erythema or exudate. Lungs: Clear to auscultation bilaterally. Non-labored. Heart: Regular rate and rhythm, no murmurs rubs or gallops.  Abdomen: Bowel sounds are normal, nontender, nondistended, no hepatosplenomegaly or masses, no abdominal bruits or hernia , no rebound or guarding.   Extremities: No lower extremity edema. No clubbing or deformities. Neuro: Alert and oriented x 3.  Grossly intact. Skin: Warm and dry, no jaundice.  Multiple scars on his legs and arms. Psych: Alert and cooperative, normal mood and affect.  Labs:    Imaging Studies: No results found.  Assessment and Plan:   Russell Rivas is a 60 y.o. y/o male who comes in today with chronic diarrhea.  The patient had not mentioned the chronic diarrhea when he recently had his colonoscopy.  The patient has symptoms consistent with irritable bowel syndrome since he has had no weight loss and he reports that the diarrhea does not wake him up at night.  The patient also reports that he has been under a lot of stress recently.  The patient has been told that his excessive alcohol use needs to stop prior to having any further damage to his body.  The patient has also been told that he will be started on dicyclomine and Lomotil for his abdominal cramps and diarrhea.  The patient will contact me if he is not feeling better. The patient and his wife have been explained the plan and agree with it.   Note: This dictation was prepared with Dragon dictation along with smaller phrase technology. Any transcriptional errors that result from this process are unintentional.

## 2016-05-30 DIAGNOSIS — M1711 Unilateral primary osteoarthritis, right knee: Secondary | ICD-10-CM | POA: Insufficient documentation

## 2016-06-27 ENCOUNTER — Emergency Department
Admission: EM | Admit: 2016-06-27 | Discharge: 2016-06-28 | Disposition: A | Discharge status: Home / Self Care | Payer: Medicare Other | Attending: Emergency Medicine | Admitting: Emergency Medicine

## 2016-06-27 ENCOUNTER — Encounter: Payer: Self-pay | Admitting: Emergency Medicine

## 2016-06-27 DIAGNOSIS — F17210 Nicotine dependence, cigarettes, uncomplicated: Secondary | ICD-10-CM | POA: Insufficient documentation

## 2016-06-27 DIAGNOSIS — Z79899 Other long term (current) drug therapy: Secondary | ICD-10-CM | POA: Diagnosis not present

## 2016-06-27 DIAGNOSIS — I10 Essential (primary) hypertension: Secondary | ICD-10-CM | POA: Insufficient documentation

## 2016-06-27 DIAGNOSIS — F1010 Alcohol abuse, uncomplicated: Secondary | ICD-10-CM

## 2016-06-27 DIAGNOSIS — E039 Hypothyroidism, unspecified: Secondary | ICD-10-CM | POA: Diagnosis not present

## 2016-06-27 DIAGNOSIS — Z791 Long term (current) use of non-steroidal anti-inflammatories (NSAID): Secondary | ICD-10-CM | POA: Insufficient documentation

## 2016-06-27 LAB — COMPREHENSIVE METABOLIC PANEL
ALT: 14 U/L — ABNORMAL LOW (ref 17–63)
AST: 19 U/L (ref 15–41)
Albumin: 4.8 g/dL (ref 3.5–5.0)
Alkaline Phosphatase: 48 U/L (ref 38–126)
Anion gap: 10 (ref 5–15)
BUN: 6 mg/dL (ref 6–20)
CO2: 28 mmol/L (ref 22–32)
Calcium: 9.8 mg/dL (ref 8.9–10.3)
Chloride: 104 mmol/L (ref 101–111)
Creatinine, Ser: 0.94 mg/dL (ref 0.61–1.24)
GFR calc Af Amer: >60 mL/min (ref >60)
GFR calc non Af Amer: >60 mL/min (ref >60)
Glucose, Bld: 97 mg/dL (ref 65–99)
Potassium: 4.6 mmol/L (ref 3.5–5.1)
Sodium: 142 mmol/L (ref 135–145)
Total Bilirubin: 0.2 mg/dL — ABNORMAL LOW (ref 0.3–1.2)
Total Protein: 8.7 g/dL — ABNORMAL HIGH (ref 6.5–8.1)

## 2016-06-27 LAB — URINE DRUG SCREEN, QUALITATIVE (ARMC ONLY)
Amphetamines, Ur Screen: NOT DETECTED
Barbiturates, Ur Screen: NOT DETECTED
Benzodiazepine, Ur Scrn: POSITIVE — AB
Cannabinoid 50 Ng, Ur ~~LOC~~: NOT DETECTED
Cocaine Metabolite,Ur ~~LOC~~: NOT DETECTED
MDMA (Ecstasy)Ur Screen: NOT DETECTED
Methadone Scn, Ur: NOT DETECTED
Opiate, Ur Screen: NOT DETECTED
Phencyclidine (PCP) Ur S: NOT DETECTED
Tricyclic, Ur Screen: NOT DETECTED

## 2016-06-27 LAB — CBC
HCT: 46.4 % (ref 40.0–52.0)
Hemoglobin: 16.5 g/dL (ref 13.0–18.0)
MCH: 33.5 pg (ref 26.0–34.0)
MCHC: 35.5 g/dL (ref 32.0–36.0)
MCV: 94.5 fL (ref 80.0–100.0)
Platelets: 264 10*3/uL (ref 150–440)
RBC: 4.91 MIL/uL (ref 4.40–5.90)
RDW: 12.4 % (ref 11.5–14.5)
WBC: 6.1 10*3/uL (ref 3.8–10.6)

## 2016-06-27 LAB — ACETAMINOPHEN LEVEL: Acetaminophen (Tylenol), Serum: <10 ug/mL — ABNORMAL LOW (ref 10–30)

## 2016-06-27 LAB — SALICYLATE LEVEL: Salicylate Lvl: <7 mg/dL (ref 2.8–30.0)

## 2016-06-27 LAB — ETHANOL: Alcohol, Ethyl (B): 205 mg/dL — ABNORMAL HIGH (ref <5)

## 2016-06-27 NOTE — ED Triage Notes (Signed)
Patient ambulatory to triage with steady gait, without difficulty or distress noted, brought in by [facility] for IVC; st drank pint today; st he called police to bring him; denies SI

## 2016-06-27 NOTE — ED Provider Notes (Addendum)
[facility] Emergency Department Provider Note  ____________________________________________   I have reviewed the triage vital signs and the nursing notes.   HISTORY  Chief Complaint Mental Health Problem    HPI Russell Rivas is a 60 y.o. male who states that he is here because he was drinking alcohol and the police found with pills in his pocket and there were concerned. The patient has no SI no HI. We called his sister after getting his consent and she agrees that he has no SI no HI. Patient states that the police were concerned because the pills were gabapentin and they're in his pockets and an organized manner. He states he was carrying that way because he may have to go sleep in a hotel tonight as his wife was selling Xanax of the house and he turned her and the police. In any event, patient has no SI no HI contracts for safety makes good eye contact and states he is only here as a police were worried about him. He denies making any statements anyone that study was going to kill himself. The IVC papers say "mixing alcohol with gabapentin history of alcoholism" patient states he did have loose pills in his pocket but he was not taking them excessively and he is prescribed them.     Past Medical History:  Diagnosis Date  . Alcohol abuse    pt reports drinking at least one pint everyday.   . Anxiety   . Arthritis   . COPD (chronic obstructive pulmonary disease) (HCC)    NO inhalers  . Depression   . GERD (gastroesophageal reflux disease)   . Hypertension   . Hypothyroidism    does not take medication at this time.  has in the past  . Polio    as a child, caused poblems in right knee.    Patient Active Problem List   Diagnosis Date Noted  . Blood in stool   . Rectal polyp   . First degree hemorrhoids   . Alcohol dependence ([place]) 06/17/2015  . Increased MCV 06/17/2015  . Current tear of meniscus 12/05/2014  . Arthropathy, traumatic, knee  12/05/2014  . Gonalgia 09/17/2014  . Lumbar radiculopathy 04/15/2013  . Complete rotator cuff rupture of left shoulder 09/04/2012    Past Surgical History:  Procedure Laterality Date  . APPENDECTOMY    . CARDIAC CATHETERIZATION Left 10/02/2015   Procedure: Left Heart Cath and Coronary Angiography;  Surgeon: Dionisio David, MD;  Location: [place] [facility];  Service: Cardiovascular;  Laterality: Left;  . COLONOSCOPY WITH PROPOFOL N/A 12/08/2015   Procedure: COLONOSCOPY WITH PROPOFOL;  Surgeon: Lucilla Lame, MD;  Location: [facility] ENDOSCOPY;  Service: Endoscopy;  Laterality: N/A;  . KNEE ARTHROSCOPY Right 09/02/2015   Procedure: ARTHROSCOPY KNEE, debridement, microfracture;  Surgeon: Leanor Kail, MD;  Location: [facility] ORS;  Service: Orthopedics;  Laterality: Right;  . KNEE ARTHROSCOPY WITH MEDIAL MENISECTOMY Right 11/26/2014   Procedure: KNEE ARTHROSCOPY WITH MEDIAL MENISECTOMY;  Surgeon: Leanor Kail, MD;  Location: [facility] ORS;  Service: Orthopedics;  Laterality: Right;  . KNEE LIGAMENT RECONSTRUCTION Right 1959   pt did not have ligaments, ligaments were made and implanted.  Marland Kitchen KNEE SURGERY Left 1969   growth bone removed.  Marland Kitchen SHOULDER ARTHROSCOPY Bilateral    one in January and one in June    Prior to Admission medications   Medication Sig Start Date End Date Taking? Authorizing Provider  chlordiazePOXIDE (LIBRIUM) 10 MG capsule Day 1-2: Take 1 tablet  PO Q6H Day 3-4: Take 1 tablet PO Q8H Day 5-6: Take 1 tablet PO Q12H Day 7-8: Take 1 tablet Daily. Patient not taking: Reported on 02/01/2016 06/16/15   Harvest Dark, MD  dicyclomine (BENTYL) 10 MG capsule Take 1 capsule (10 mg total) by mouth 4 (four) times daily -  before meals and at bedtime. 02/01/16   Lucilla Lame, MD  diphenoxylate-atropine (LOMOTIL) 2.5-0.025 MG tablet Take 1 tablet by mouth 4 (four) times daily as needed for diarrhea or loose stools. 02/01/16   Lucilla Lame, MD  ibuprofen (ADVIL,MOTRIN) 800 MG tablet Take by mouth.  09/23/15   Historical Provider, MD  levothyroxine (SYNTHROID, LEVOTHROID) 25 MCG tablet Take 25 mcg by mouth daily before breakfast.    Historical Provider, MD  LORazepam (ATIVAN) 0.5 MG tablet Take 0.5 mg by mouth every 8 (eight) hours.    Historical Provider, MD  metoprolol succinate (TOPROL-XL) 25 MG 24 hr tablet Take 25 mg by mouth every morning.    Historical Provider, MD  Multiple Vitamins-Minerals (MULTIVITAMIN WITH MINERALS) tablet Take 1 tablet by mouth every morning.     Historical Provider, MD  naproxen (NAPROSYN) 500 MG tablet Take 1 tablet (500 mg total) by mouth 2 (two) times daily with a meal. 12/22/15 12/21/16  Joanne Gavel, MD  oxyCODONE (ROXICODONE) 5 MG immediate release tablet Take 1 tablet (5 mg total) by mouth every 6 (six) hours as needed for breakthrough pain (Take as prescribed if you are still having pain despite taking naproxen and Flexeril as prescribed.). Do not drive while taking this medication. Patient not taking: Reported on 02/01/2016 12/22/15   Joanne Gavel, MD  oxyCODONE-acetaminophen (PERCOCET/ROXICET) 5-325 MG tablet Take 1 tablet by mouth every 4 (four) hours as needed for severe pain. Reported on 11/04/2015    Historical Provider, MD  pregabalin (LYRICA) 150 MG capsule Take 150 mg by mouth.    Historical Provider, MD    Allergies Hydrocodone  Family History  Problem Relation Age of Onset  . COPD Mother   . Heart disease Father     Social History Social History  Substance Use Topics  . Smoking status: Current Every Day Smoker    Packs/day: 1.00    Years: 30.00    Types: Cigarettes  . Smokeless tobacco: Never Used  . Alcohol use 34.8 oz/week    58 Cans of beer per week     Comment: 1 pint liquer per week    Review of Systems Constitutional: No fever/chills Eyes: No visual changes. ENT: No sore throat. No stiff neck no neck pain Cardiovascular: Denies chest pain. Respiratory: Denies shortness of breath. Gastrointestinal:   no vomiting.  No  diarrhea.  No constipation. Genitourinary: Negative for dysuria. Musculoskeletal: Negative lower extremity swelling Skin: Negative for rash. Neurological: Negative for severe headaches, focal weakness or numbness. 10-point ROS otherwise negative.  ____________________________________________   PHYSICAL EXAM:  VITAL SIGNS: ED Triage Vitals  Enc Vitals Group     BP 06/27/16 2045 (!) 133/96     Pulse Rate 06/27/16 2045 84     Resp 06/27/16 2045 20     Temp 06/27/16 2045 97.6 F (36.4 C)     Temp Source 06/27/16 2045 Oral     SpO2 06/27/16 2045 100 %     Weight 06/27/16 2046 190 lb (86.2 kg)     Height 06/27/16 2046 5\' 6"  (1.676 m)     Head Circumference --      Peak Flow --  Pain Score --      Pain Loc --      Pain Edu? --      Excl. in [place]? --     Constitutional: Alert and oriented. Well appearing and in no acute distress. Eyes: Conjunctivae are normal. PERRL. EOMI. Head: Atraumatic. Nose: No congestion/rhinnorhea. Mouth/Throat: Mucous membranes are moist.  Oropharynx non-erythematous. Neck: No stridor.   Nontender with no meningismus Cardiovascular: Normal rate, regular rhythm. Grossly normal heart sounds.  Good peripheral circulation. Respiratory: Normal respiratory effort.  No retractions. Lungs CTAB. Abdominal: Soft and nontender. No distention. No guarding no rebound Back:  There is no focal tenderness or step off.  there is no midline tenderness there are no lesions noted. there is no CVA tenderness Musculoskeletal: No lower extremity tenderness, no upper extremity tenderness. No joint effusions, no DVT signs strong distal pulses no edema Neurologic:  Normal speech and language. No gross focal neurologic deficits are appreciated.  Skin:  Skin is warm, dry and intact. No rash noted. Psychiatric: Mood and affect are normal. Speech and behavior are normal.  ____________________________________________   LABS (all labs ordered are listed, but only abnormal results  are displayed)  Labs Reviewed  COMPREHENSIVE METABOLIC PANEL - Abnormal; Notable for the following:       Result Value   Total Protein 8.7 (*)    ALT 14 (*)    Total Bilirubin 0.2 (*)    All other components within normal limits  ETHANOL - Abnormal; Notable for the following:    Alcohol, Ethyl (B) 205 (*)    All other components within normal limits  ACETAMINOPHEN LEVEL - Abnormal; Notable for the following:    Acetaminophen (Tylenol), Serum <10 (*)    All other components within normal limits  SALICYLATE LEVEL  CBC  URINE DRUG SCREEN, QUALITATIVE ([facility] ONLY)   ____________________________________________  EKG  I personally interpreted any EKGs ordered by me or triage  ____________________________________________  RADIOLOGY  I reviewed any imaging ordered by me or triage that were performed during my shift and, if possible, patient and/or family made aware of any abnormal findings. ____________________________________________   PROCEDURES  Procedure(s) performed: None  Procedures  Critical Care performed: None  ____________________________________________   INITIAL IMPRESSION / ASSESSMENT AND PLAN / ED COURSE  Pertinent labs & imaging results that were available during my care of the patient were reviewed by me and considered in my medical decision making (see chart for details).  Seen here with no SI no HI, he does have pills and he has been drinking he admits all this freely but he states he does not want to kill himself. He contracts for safety. I do not think he needs to be kept under involuntary commitment at this time. We have discussed with his family members who agree. I did try to call his wife, but she is not answering a phone and maybe under police custody.  ----------------------------------------- 11:29 PM on 06/27/2016 -----------------------------------------  I called and talked to patient's sister, she is stated that he has never been suicidal  she has no suspicion that he has now. She will come pick him up. He contracts for safety. I rescinded the IVC. He is not clinically intoxicated; ambulatory at this time and agreeable to discharge. Return precautions and follow-up given and understood.He has no thoughts of hurting his wife or anyone else.  Clinical Course    ____________________________________________   FINAL CLINICAL IMPRESSION(S) / ED DIAGNOSES  Final diagnoses:  None  This chart was dictated using voice recognition software.  Despite best efforts to proofread,  errors can occur which can change meaning.      Schuyler Amor, MD 06/27/16 [place], MD 06/27/16 [place], MD 06/27/16 [phone]
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
## 2016-06-27 NOTE — ED Notes (Signed)
Pt gave verbal permission for this RN to talk to sister Bailey Mech at [phone].
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
## 2016-06-28 NOTE — ED Notes (Signed)
Pt discharged to home.  Family member driving.  Discharge instructions reviewed.  Verbalized understanding.  No questions or concerns at this time.  Teach back verified.  Pt in NAD.  No items left in ED.   

## 2016-10-16 DIAGNOSIS — Y999 Unspecified external cause status: Secondary | ICD-10-CM | POA: Insufficient documentation

## 2016-10-16 DIAGNOSIS — Y939 Activity, unspecified: Secondary | ICD-10-CM | POA: Diagnosis not present

## 2016-10-16 DIAGNOSIS — Z5321 Procedure and treatment not carried out due to patient leaving prior to being seen by health care provider: Secondary | ICD-10-CM | POA: Insufficient documentation

## 2016-10-16 DIAGNOSIS — W19XXXA Unspecified fall, initial encounter: Secondary | ICD-10-CM | POA: Insufficient documentation

## 2016-10-16 DIAGNOSIS — Y929 Unspecified place or not applicable: Secondary | ICD-10-CM | POA: Insufficient documentation

## 2016-10-16 DIAGNOSIS — M25561 Pain in right knee: Secondary | ICD-10-CM | POA: Insufficient documentation

## 2016-10-16 DIAGNOSIS — S8991XA Unspecified injury of right lower leg, initial encounter: Secondary | ICD-10-CM | POA: Diagnosis present

## 2016-10-17 ENCOUNTER — Emergency Department
Admission: EM | Admit: 2016-10-17 | Discharge: 2016-10-17 | Disposition: A | Discharge status: Home / Self Care | Payer: Medicare Other | Attending: Emergency Medicine | Admitting: Emergency Medicine

## 2016-10-17 ENCOUNTER — Emergency Department: Payer: Medicare Other

## 2016-10-17 NOTE — ED Triage Notes (Signed)
Pt has had several surgeries on his right knee through his lifetime, last one was 11/2015; pt has experienced multiple fall in the last few days, 4 times tonight; pt says knee "feels loose" and gives out; increased pain and swelling from right foot up to right thigh; pain is to front of leg; pt says "I can probably walk on it, but I don't know how far"; pt talkative in triage

## 2016-10-17 NOTE — ED Notes (Signed)
Patient to stat registration via wheelchair by EMS.  Patient right knee pain that led to multiple falls over the past 2 weeks..  Patient also reporting new back pain since the falls (patient with history of chronic back pain, but this is new).  Also increased weakness.     HR - 87; BP - 177 94;  Pulse ox 96% on room air.

## 2016-10-19 ENCOUNTER — Encounter: Payer: Self-pay | Admitting: *Deleted

## 2016-10-19 ENCOUNTER — Emergency Department
Admission: EM | Admit: 2016-10-19 | Discharge: 2016-10-20 | Disposition: A | Discharge status: Home / Self Care | Payer: Medicare Other | Attending: Emergency Medicine | Admitting: Emergency Medicine

## 2016-10-19 DIAGNOSIS — R112 Nausea with vomiting, unspecified: Secondary | ICD-10-CM | POA: Insufficient documentation

## 2016-10-19 DIAGNOSIS — I10 Essential (primary) hypertension: Secondary | ICD-10-CM | POA: Insufficient documentation

## 2016-10-19 DIAGNOSIS — L03113 Cellulitis of right upper limb: Secondary | ICD-10-CM | POA: Diagnosis not present

## 2016-10-19 DIAGNOSIS — F17210 Nicotine dependence, cigarettes, uncomplicated: Secondary | ICD-10-CM | POA: Diagnosis not present

## 2016-10-19 DIAGNOSIS — J449 Chronic obstructive pulmonary disease, unspecified: Secondary | ICD-10-CM | POA: Diagnosis not present

## 2016-10-19 DIAGNOSIS — E039 Hypothyroidism, unspecified: Secondary | ICD-10-CM | POA: Diagnosis not present

## 2016-10-19 LAB — COMPREHENSIVE METABOLIC PANEL WITH GFR
ALT: 28 U/L (ref 17–63)
AST: 46 U/L — ABNORMAL HIGH (ref 15–41)
Albumin: 4.4 g/dL (ref 3.5–5.0)
Alkaline Phosphatase: 66 U/L (ref 38–126)
Anion gap: 11 (ref 5–15)
BUN: 8 mg/dL (ref 6–20)
CO2: 24 mmol/L (ref 22–32)
Calcium: 9.9 mg/dL (ref 8.9–10.3)
Chloride: 101 mmol/L (ref 101–111)
Creatinine, Ser: 0.74 mg/dL (ref 0.61–1.24)
GFR calc Af Amer: >60 mL/min
GFR calc non Af Amer: >60 mL/min
Glucose, Bld: 107 mg/dL — ABNORMAL HIGH (ref 65–99)
Potassium: 3.8 mmol/L (ref 3.5–5.1)
Sodium: 136 mmol/L (ref 135–145)
Total Bilirubin: 1.4 mg/dL — ABNORMAL HIGH (ref 0.3–1.2)
Total Protein: 8.4 g/dL — ABNORMAL HIGH (ref 6.5–8.1)

## 2016-10-19 LAB — CBC
HCT: 46.7 % (ref 40.0–52.0)
Hemoglobin: 16.1 g/dL (ref 13.0–18.0)
MCH: 33 pg (ref 26.0–34.0)
MCHC: 34.5 g/dL (ref 32.0–36.0)
MCV: 95.7 fL (ref 80.0–100.0)
Platelets: 155 K/uL (ref 150–440)
RBC: 4.88 MIL/uL (ref 4.40–5.90)
RDW: 14 % (ref 11.5–14.5)
WBC: 4.1 K/uL (ref 3.8–10.6)

## 2016-10-19 LAB — TROPONIN I: Troponin I: <0.03 ng/mL (ref <0.03)

## 2016-10-19 LAB — LIPASE, BLOOD: Lipase: 25 U/L (ref 11–51)

## 2016-10-19 MED ORDER — SODIUM CHLORIDE 0.9 % IV BOLUS (SEPSIS)
1000.0000 mL | Freq: Once | INTRAVENOUS | Status: AC
  Administered 2016-10-19: 1000 mL via INTRAVENOUS

## 2016-10-19 MED ORDER — ONDANSETRON HCL 4 MG/2ML IJ SOLN
4.0000 mg | Freq: Once | INTRAMUSCULAR | Status: AC
  Administered 2016-10-19: 4 mg via INTRAVENOUS
  Filled 2016-10-19: qty 2

## 2016-10-19 NOTE — ED Notes (Signed)
RN called lab to inform of add on troponin. Lab reports they are running blood work at this time.

## 2016-10-19 NOTE — ED Notes (Signed)
MD at bedside to update pt. PT reports he continues to feel "uncomfortable" but continues to deny chest pain. Pt remains alert and oriented x 4.

## 2016-10-19 NOTE — ED Provider Notes (Signed)
[facility] Emergency Department Provider Note   ____________________________________________   I have reviewed the triage vital signs and the nursing notes.   HISTORY  Chief Complaint Emesis   History limited by: Not Limited   HPI Russell Rivas is a 61 y.o. male who presents to the emergency department today because of concerns for nausea and vomiting. The patient states his symptoms started shortly after awakening this morning. He then became nauseous and had multiple episodes of nonbloody vomiting. He did become clammy and pale during these episodes. His nausea continued throughout the day and he was unable to tolerate any food. He have any abdominal pain or chest pain with these episodes. Denies any diarrhea. Denies any fevers. Denies any unusual ingestions.   Past Medical History:  Diagnosis Date  . Alcohol abuse    pt reports drinking at least one pint everyday.   . Anxiety   . Arthritis   . COPD (chronic obstructive pulmonary disease) (HCC)    NO inhalers  . Depression   . GERD (gastroesophageal reflux disease)   . Hypertension   . Hypothyroidism    does not take medication at this time.  has in the past  . Polio    as a child, caused poblems in right knee.    Patient Active Problem List   Diagnosis Date Noted  . Blood in stool   . Rectal polyp   . First degree hemorrhoids   . Alcohol dependence ([place]) 06/17/2015  . Increased MCV 06/17/2015  . Current tear of meniscus 12/05/2014  . 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 Gonalgia 09/17/2014  . Lumbar radiculopathy 04/15/2013  . Complete rotator cuff rupture of left shoulder 09/04/2012    Past Surgical History:  Procedure Laterality Date  . APPENDECTOMY    . CARDIAC CATHETERIZATION Left 10/02/2015   Procedure: Left Heart Cath and Coronary Angiography;  Surgeon: Dionisio David, MD;  Location: [place] [facility];  Service: Cardiovascular;  Laterality: Left;  . COLONOSCOPY WITH  PROPOFOL N/A 12/08/2015   Procedure: COLONOSCOPY WITH PROPOFOL;  Surgeon: Lucilla Lame, MD;  Location: [facility] ENDOS[facility];  Service: Endoscopy;  Laterality: N/A;  . KNEE ARTHROSCOPY Right 09/02/2015   Procedure: ARTHROSCOPY KNEE, debridement, microfracture;  Surgeon: Leanor Kail, MD;  Location: [facility] ORS;  Service: Orthopedics;  Laterality: Right;  . KNEE ARTHROSCOPY WITH MEDIAL MENISECTOMY Right 11/26/2014   Procedure: KNEE ARTHROSCOPY WITH MEDIAL MENISECTOMY;  Surgeon: Leanor Kail, MD;  Location: [facility] ORS;  Service: Orthopedics;  Laterality: Right;  . KNEE LIGAMENT RECONSTRUCTION Right 1959   pt did not have ligaments, ligaments were made and implanted.  Marland Kitchen KNEE SURGERY Left 1969   growth bone removed.  Marland Kitchen SHOULDER ARTHROSCOPY Bilateral    one in January and one in June    Prior to Admission medications   Medication Sig Start Date End Date Taking? Authorizing Provider  chlordiazePOXIDE (LIBRIUM) 10 MG capsule Day 1-2: Take 1 tablet PO Q6H Day 3-4: Take 1 tablet PO Q8H Day 5-6: Take 1 tablet PO Q12H Day 7-8: Take 1 tablet Daily. Patient not taking: Reported on 02/01/2016 06/16/15   Harvest Dark, MD  dicyclomine (BENTYL) 10 MG capsule Take 1 capsule (10 mg total) by mouth 4 (four) times daily -  before meals and at bedtime. 02/01/16   Lucilla Lame, MD  diphenoxylate-atropine (LOMOTIL) 2.5-0.025 MG tablet Take 1 tablet by mouth 4 (four) times daily as needed for diarrhea or loose stools. 02/01/16   Lucilla Lame, MD  ibuprofen (ADVIL,MOTRIN)  800 MG tablet Take by mouth. 09/23/15   Historical Provider, MD  levothyroxine (SYNTHROID, LEVOTHROID) 25 MCG tablet Take 25 mcg by mouth daily before breakfast.    Historical Provider, MD  LORazepam (ATIVAN) 0.5 MG tablet Take 0.5 mg by mouth every 8 (eight) hours.    Historical Provider, MD  metoprolol succinate (TOPROL-XL) 25 MG 24 hr tablet Take 25 mg by mouth every morning.    Historical Provider, MD  Multiple Vitamins-Minerals (MULTIVITAMIN WITH  MINERALS) tablet Take 1 tablet by mouth every morning.     Historical Provider, MD  naproxen (NAPROSYN) 500 MG tablet Take 1 tablet (500 mg total) by mouth 2 (two) times daily with a meal. 12/22/15 12/21/16  Joanne Gavel, MD  oxyCODONE (ROXICODONE) 5 MG immediate release tablet Take 1 tablet (5 mg total) by mouth every 6 (six) hours as needed for breakthrough pain (Take as prescribed if you are still having pain despite taking naproxen and Flexeril as prescribed.). Do not drive while taking this medication. Patient not taking: Reported on 02/01/2016 12/22/15   Joanne Gavel, MD  oxyCODONE-acetaminophen (PERCOCET/ROXICET) 5-325 MG tablet Take 1 tablet by mouth every 4 (four) hours as needed for severe pain. Reported on 11/04/2015    Historical Provider, MD  pregabalin (LYRICA) 150 MG capsule Take 150 mg by mouth.    Historical Provider, MD    Allergies Hydrocodone  Family History  Problem Relation Age of Onset  . COPD Mother   . Heart disease Father     Social History Social History  Substance Use Topics  . Smoking status: Current Every Day Smoker    Packs/day: 1.00    Years: 30.00    Types: Cigarettes  . Smokeless tobacco: Never Used  . Alcohol use No     Comment: 1 pint liquer per week. Pt reports he has stopped drinking like this in August of 2017    Review of Systems  Constitutional: Negative for fever. Cardiovascular: Negative for chest pain. Respiratory: Negative for shortness of breath. Gastrointestinal: Negative for abdominal pain. Positive for nausea and vomiting.  Genitourinary: Negative for dysuria. Musculoskeletal: Negative for back pain. Skin: Negative for rash. Neurological: Negative for headaches, focal weakness or numbness.  10-point ROS otherwise negative.  ____________________________________________   PHYSICAL EXAM:  VITAL SIGNS: ED Triage Vitals  Enc Vitals Group     BP 10/19/16 2125 (!) 172/101     Pulse Rate 10/19/16 2125 76     Resp 10/19/16 2125  20     Temp 10/19/16 2125 98.3 F (36.8 C)     Temp Source 10/19/16 2125 Oral     SpO2 10/19/16 2125 100 %     Weight 10/19/16 2124 200 lb (90.7 kg)     Height 10/19/16 2124 5\' 6"  (1.676 m)   Constitutional: Alert and oriented. Well appearing and in no distress. Eyes: Conjunctivae are normal. Normal extraocular movements. ENT   Head: Normocephalic and atraumatic.   Nose: No congestion/rhinnorhea.   Mouth/Throat: Mucous membranes are moist.   Neck: No stridor. Hematological/Lymphatic/Immunilogical: No cervical lymphadenopathy. Cardiovascular: Normal rate, regular rhythm.  No murmurs, rubs, or gallops.  Respiratory: Normal respiratory effort without tachypnea nor retractions. Breath sounds are clear and equal bilaterally. No wheezes/rales/rhonchi. Gastrointestinal: Soft and non tender. No rebound. No guarding.  Genitourinary: Deferred Musculoskeletal: Normal range of motion in all extremities. No lower extremity edema. Neurologic:  Normal speech and language. No gross focal neurologic deficits are appreciated.  Skin:  Skin is warm, dry and intact.  Area of bruising around the site of what appears to be insect bite on right forearm Psychiatric: Mood and affect are normal. Speech and behavior are normal. Patient exhibits appropriate insight and judgment.  ____________________________________________    LABS (pertinent positives/negatives)  Labs Reviewed  COMPREHENSIVE METABOLIC PANEL - Abnormal; Notable for the following:       Result Value   Glucose, Bld 107 (*)    Total Protein 8.4 (*)    AST 46 (*)    Total Bilirubin 1.4 (*)    All other components within normal limits  LIPASE, BLOOD  CBC  TROPONIN I  URINALYSIS, COMPLETE (UACMP) WITH MICROSCOPIC     ____________________________________________   EKG  I, Nance Pear, attending physician, personally viewed and interpreted this EKG  EKG Time: 2127 Rate: 83 Rhythm: normal sinus rhythm Axis:  normal Intervals: qtc 479 QRS: narrow, q waves V1 ST changes: no st elevation Impression: abnormal ekg   ____________________________________________    RADIOLOGY  None  ____________________________________________   PROCEDURES  Procedures  ____________________________________________   INITIAL IMPRESSION / ASSESSMENT AND PLAN / ED COURSE  Pertinent labs & imaging results that were available during my care of the patient were reviewed by me and considered in my medical decision making (see chart for details).  Patient presented to the emergency department today with episodes of nausea and vomiting. The patient did not have any abdominal tenderness on exam. EKG and troponin negative. I would expect the troponin be elevated given the length of symptoms throughout the day if the nausea and vomiting was a serious. However patient was out any chest pain or other symptoms to suggest ACS. This point I wonder if patient partially potentially has a viral illness. No leukocytosis. Will discharge with nausea medicine additionally will discharge with antibiotics for possible cellulitis of the right arm. Discussed return precautions.  ____________________________________________   FINAL CLINICAL IMPRESSION(S) / ED DIAGNOSES  Final diagnoses:  Nausea and vomiting, intractability of vomiting not specified, unspecified vomiting type  Cellulitis of right upper extremity     Note: This dictation was prepared with Dragon dictation. Any transcriptional errors that result from this process are unintentional      Nance Pear, MD 10/20/16 0010

## 2016-10-19 NOTE — ED Notes (Signed)
Pt ambulatory to the bathroom by self. Pt denies dizziness upon ambulation. Pt reports having had another episode of shaking and diaphoresis.

## 2016-10-19 NOTE — ED Triage Notes (Signed)
Pt arrived to ED from home reporting onset of NV this morning upon waking. Pt reports increased weakness, emesis x 3 with diaphoresis and shaking intermittently throughout the day. Pt has circular mark on right arm and reports having worked in the yard yesterday without knowing if he was bitten by anything.

## 2016-10-20 MED ORDER — DOXYCYCLINE HYCLATE 100 MG PO CAPS: 100.0000 mg | ORAL_CAPSULE | Freq: Two times a day (BID) | ORAL | 8 refills | Status: DC

## 2016-10-20 MED ORDER — LORAZEPAM 2 MG PO TABS
2.0000 mg | ORAL_TABLET | Freq: Once | ORAL | Status: AC
  Administered 2016-10-20: 2 mg via ORAL

## 2016-10-20 MED ORDER — ONDANSETRON HCL 4 MG PO TABS: 4.0000 mg | ORAL_TABLET | Freq: Three times a day (TID) | ORAL | 0 refills | Status: DC | PRN

## 2016-10-20 NOTE — ED Notes (Signed)
Pts discharged and left [facility] ED at [place].  Pt not discharged from ED track board until now, pts pain level that of departure condition

## 2016-10-20 NOTE — Discharge Instructions (Signed)
Please seek medical attention for any high fevers, chest pain, shortness of breath, change in behavior, persistent vomiting, bloody stool or any other new or concerning symptoms.  

## 2016-10-20 NOTE — ED Notes (Signed)
Pts wife reports pt is still an alcoholic and has been drinking more than 15 beers a day. PT did not have anything to drink today and wife is concerned shaking is due to this. MD made aware.

## 2017-02-17 ENCOUNTER — Other Ambulatory Visit: Payer: Self-pay

## 2017-02-17 ENCOUNTER — Other Ambulatory Visit: Payer: Self-pay | Admitting: Gastroenterology

## 2017-02-17 MED ORDER — DICYCLOMINE HCL 10 MG PO CAPS: 10.0000 mg | ORAL_CAPSULE | Freq: Three times a day (TID) | ORAL | 3 refills | Status: DC

## 2017-04-26 DIAGNOSIS — I2510 Atherosclerotic heart disease of native coronary artery without angina pectoris: Secondary | ICD-10-CM | POA: Insufficient documentation

## 2017-04-26 DIAGNOSIS — J449 Chronic obstructive pulmonary disease, unspecified: Secondary | ICD-10-CM | POA: Insufficient documentation

## 2017-04-26 DIAGNOSIS — Z720 Tobacco use: Secondary | ICD-10-CM | POA: Insufficient documentation

## 2017-07-26 ENCOUNTER — Emergency Department: Payer: Medicare Other

## 2017-07-26 ENCOUNTER — Other Ambulatory Visit: Payer: Self-pay

## 2017-07-26 ENCOUNTER — Emergency Department
Admission: EM | Admit: 2017-07-26 | Discharge: 2017-07-26 | Disposition: A | Discharge status: Home / Self Care | Payer: Medicare Other | Attending: Emergency Medicine | Admitting: Emergency Medicine

## 2017-07-26 DIAGNOSIS — E039 Hypothyroidism, unspecified: Secondary | ICD-10-CM | POA: Insufficient documentation

## 2017-07-26 DIAGNOSIS — Z880 Allergy status to penicillin: Secondary | ICD-10-CM | POA: Diagnosis not present

## 2017-07-26 DIAGNOSIS — J449 Chronic obstructive pulmonary disease, unspecified: Secondary | ICD-10-CM | POA: Diagnosis not present

## 2017-07-26 DIAGNOSIS — I10 Essential (primary) hypertension: Secondary | ICD-10-CM | POA: Insufficient documentation

## 2017-07-26 DIAGNOSIS — Z79899 Other long term (current) drug therapy: Secondary | ICD-10-CM | POA: Diagnosis not present

## 2017-07-26 DIAGNOSIS — F17210 Nicotine dependence, cigarettes, uncomplicated: Secondary | ICD-10-CM | POA: Diagnosis not present

## 2017-07-26 DIAGNOSIS — R0789 Other chest pain: Secondary | ICD-10-CM | POA: Insufficient documentation

## 2017-07-26 DIAGNOSIS — Z885 Allergy status to narcotic agent status: Secondary | ICD-10-CM | POA: Diagnosis not present

## 2017-07-26 MED ORDER — KETOROLAC TROMETHAMINE 30 MG/ML IJ SOLN: 60.0000 mg | Freq: Once | INTRAMUSCULAR | Status: DC

## 2017-07-26 MED ORDER — IBUPROFEN 800 MG PO TABS: 800.0000 mg | ORAL_TABLET | Freq: Three times a day (TID) | ORAL | 0 refills | Status: DC | PRN

## 2017-07-26 MED ORDER — CYCLOBENZAPRINE HCL 10 MG PO TABS
5.0000 mg | ORAL_TABLET | Freq: Once | ORAL | Status: AC
  Administered 2017-07-26: 5 mg via ORAL

## 2017-07-26 MED ORDER — KETOROLAC TROMETHAMINE 60 MG/2ML IM SOLN
INTRAMUSCULAR | Status: AC
  Administered 2017-07-26: 60 mg
  Filled 2017-07-26: qty 2

## 2017-07-26 MED ORDER — CYCLOBENZAPRINE HCL 5 MG PO TABS: ORAL_TABLET | ORAL | 0 refills | Status: DC

## 2017-07-26 MED ORDER — CYCLOBENZAPRINE HCL 10 MG PO TABS
ORAL_TABLET | ORAL | Status: AC
  Administered 2017-07-26: 5 mg via ORAL
  Filled 2017-07-26: qty 1

## 2017-07-26 NOTE — ED Triage Notes (Signed)
Pt in with co soreness to right breast area that radiates to right rib area. Pain is worse on palpation and movement. States family member has been dx with shingles and he states "I think I have internal shingles". No visible rash noted to skin.

## 2017-07-26 NOTE — ED Notes (Signed)
No protocols to be done at this time per Dr Brown 

## 2017-07-26 NOTE — Discharge Instructions (Signed)
1.  You may take medicines as needed for pain and muscle spasms (Motrin/Flexeril). 2.  Apply moist heat to affected area several times daily. 3.  Return to the ER for worsening symptoms, persistent vomiting, faculty breathing or other concerns.

## 2017-07-26 NOTE — ED Provider Notes (Signed)
[facility] Emergency Department Provider Note   ____________________________________________   First MD Initiated Contact with Patient 07/26/17 0259     (approximate)  I have reviewed the triage vital signs and the nursing notes.   HISTORY  Chief Complaint Pleurisy    HPI Russell Rivas is a 62 y.o. male who presents to the ED from home with a chief complaint of right chest wall pain.  Patient had cold-like symptoms last week, notes right-sided rib pain for the past 3-4 days.  Pain is worse on palpation and movement of trunk.  Patient has had multiple family members with shingles and is concerned that he has "internal shingles".  Denies associated fever, chills, shortness of breath, abdominal pain, nausea or vomiting.  Denies recent travel or trauma.   Past Medical History:  Diagnosis Date  . Alcohol abuse    pt reports drinking at least one pint everyday.   . Anxiety   . Arthritis   . COPD (chronic obstructive pulmonary disease) (HCC)    NO inhalers  . Depression   . GERD (gastroesophageal reflux disease)   . Hypertension   . Hypothyroidism    does not take medication at this time.  has in the past  . Polio    as a child, caused poblems in right knee.    Patient Active Problem List   Diagnosis Date Noted  . Blood in stool   . Rectal polyp   . First degree hemorrhoids   . Alcohol dependence ([place]) 06/17/2015  . Increased MCV 06/17/2015  . Current tear of meniscus 12/05/2014  . 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 Gonalgia 09/17/2014  . Lumbar radiculopathy 04/15/2013  . Complete rotator cuff rupture of left shoulder 09/04/2012    Past Surgical History:  Procedure Laterality Date  . APPENDECTOMY    . CARDIAC CATHETERIZATION Left 10/02/2015   Procedure: Left Heart Cath and Coronary Angiography;  Surgeon: Dionisio David, MD;  Location: [place] [facility];  Service: Cardiovascular;  Laterality: Left;  . COLONOSCOPY WITH PROPOFOL  N/A 12/08/2015   Procedure: COLONOSCOPY WITH PROPOFOL;  Surgeon: Lucilla Lame, MD;  Location: [facility] ENDOS[facility];  Service: Endoscopy;  Laterality: N/A;  . KNEE ARTHROSCOPY Right 09/02/2015   Procedure: ARTHROSCOPY KNEE, debridement, microfracture;  Surgeon: Leanor Kail, MD;  Location: [facility] ORS;  Service: Orthopedics;  Laterality: Right;  . KNEE ARTHROSCOPY WITH MEDIAL MENISECTOMY Right 11/26/2014   Procedure: KNEE ARTHROSCOPY WITH MEDIAL MENISECTOMY;  Surgeon: Leanor Kail, MD;  Location: [facility] ORS;  Service: Orthopedics;  Laterality: Right;  . KNEE LIGAMENT RECONSTRUCTION Right 1959   pt did not have ligaments, ligaments were made and implanted.  Marland Kitchen KNEE SURGERY Left 1969   growth bone removed.  Marland Kitchen SHOULDER ARTHROSCOPY Bilateral    one in January and one in June    Prior to Admission medications   Medication Sig Start Date End Date Taking? Authorizing Provider  chlordiazePOXIDE (LIBRIUM) 10 MG capsule Day 1-2: Take 1 tablet PO Q6H Day 3-4: Take 1 tablet PO Q8H Day 5-6: Take 1 tablet PO Q12H Day 7-8: Take 1 tablet Daily. Patient not taking: Reported on 02/01/2016 06/16/15   Harvest Dark, MD  dicyclomine (BENTYL) 10 MG capsule Take 1 capsule (10 mg total) by mouth 4 (four) times daily -  before meals and at bedtime. **NEEDS FOLLOW UP APPOINTMENT** 02/17/17   Lucilla Lame, MD  diphenoxylate-atropine (LOMOTIL) 2.5-0.025 MG tablet Take 1 tablet by mouth 4 (four) times daily as needed for diarrhea or  loose stools. 02/01/16   Lucilla Lame, MD  doxycycline (VIBRAMYCIN) 100 MG capsule Take 1 capsule (100 mg total) by mouth 2 (two) times daily. 10/20/16   Nance Pear, MD  ibuprofen (ADVIL,MOTRIN) 800 MG tablet Take by mouth. 09/23/15   [provider]  levothyroxine (SYNTHROID, LEVOTHROID) 25 MCG tablet Take 25 mcg by mouth daily before breakfast.    [provider]  LORazepam (ATIVAN) 0.5 MG tablet Take 0.5 mg by mouth every 8 (eight) hours.    [provider]    metoprolol succinate (TOPROL-XL) 25 MG 24 hr tablet Take 25 mg by mouth every morning.    [provider]  Multiple Vitamins-Minerals (MULTIVITAMIN WITH MINERALS) tablet Take 1 tablet by mouth every morning.     [provider]  ondansetron (ZOFRAN) 4 MG tablet Take 1 tablet (4 mg total) by mouth every 8 (eight) hours as needed for nausea or vomiting. 10/20/16   Nance Pear, MD  oxyCODONE (ROXICODONE) 5 MG immediate release tablet Take 1 tablet (5 mg total) by mouth every 6 (six) hours as needed for breakthrough pain (Take as prescribed if you are still having pain despite taking naproxen and Flexeril as prescribed.). Do not drive while taking this medication. Patient not taking: Reported on 02/01/2016 12/22/15   Joanne Gavel, MD  oxyCODONE-acetaminophen (PERCOCET/ROXICET) 5-325 MG tablet Take 1 tablet by mouth every 4 (four) hours as needed for severe pain. Reported on 11/04/2015    [provider]  pregabalin (LYRICA) 150 MG capsule Take 150 mg by mouth.    [provider]    Allergies Hydrocodone and Penicillins  Family History  Problem Relation Age of Onset  . COPD Mother   . Heart disease Father     Social History Social History   Tobacco Use  . Smoking status: Current Every Day Smoker    Packs/day: 1.00    Years: 30.00    Pack years: 30.00    Types: Cigarettes  . Smokeless tobacco: Never Used  Substance Use Topics  . Alcohol use: No    Alcohol/week: 34.8 oz    Types: 58 Cans of beer per week    Comment: 1 pint liquer per week. Pt reports he has stopped drinking like this in August of 2017  . Drug use: No    Review of Systems  Constitutional: No fever/chills. Eyes: No visual changes. ENT: No sore throat. Cardiovascular: Positive for chest pain. Respiratory: Denies shortness of breath. Gastrointestinal: No abdominal pain.  No nausea, no vomiting.  No diarrhea.  No constipation. Genitourinary: Negative for  dysuria. Musculoskeletal: Negative for back pain. Skin: Negative for rash. Neurological: Negative for headaches, focal weakness or numbness.   ____________________________________________   PHYSICAL EXAM:  VITAL SIGNS: ED Triage Vitals  Enc Vitals Group     BP 07/26/17 0052 (!) 176/95     Pulse Rate 07/26/17 0052 (!) 102     Resp 07/26/17 0052 20     Temp 07/26/17 0052 99.8 F (37.7 C)     Temp Source 07/26/17 0052 Oral     SpO2 07/26/17 0052 99 %     Weight 07/26/17 0051 185 lb (83.9 kg)     Height 07/26/17 0051 5\' 6"  (1.676 m)     Head Circumference --      Peak Flow --      Pain Score 07/26/17 0051 8     Pain Loc --      Pain Edu? --  Excl. in [place]? --     Constitutional: Alert and oriented. Well appearing and in no acute distress. Eyes: Conjunctivae are normal. PERRL. EOMI. Head: Atraumatic. Nose: No congestion/rhinnorhea. Mouth/Throat: Mucous membranes are moist.  Oropharynx non-erythematous. Neck: No stridor.   Cardiovascular: Normal rate, regular rhythm. Grossly normal heart sounds.  Good peripheral circulation. Respiratory: Right lower ribs tender to palpation.  Splinting.  No crepitus.  Pain on movement of trunk.  Normal respiratory effort.  No retractions. Lungs CTAB. Gastrointestinal: Soft and nontender. No distention. No abdominal bruits. No CVA tenderness. Musculoskeletal: No lower extremity tenderness nor edema.  No joint effusions. Neurologic:  Normal speech and language. No gross focal neurologic deficits are appreciated. No gait instability. Skin:  Skin is warm, dry and intact. No rash noted. Psychiatric: Mood and affect are normal. Speech and behavior are normal.  ____________________________________________   LABS (all labs ordered are listed, but only abnormal results are displayed)  Labs Reviewed - No data to display ____________________________________________  EKG  None ____________________________________________  RADIOLOGY  Dg  Chest 2 View  Result Date: 07/26/2017 CLINICAL DATA:  62 y/o M; soreness to the right breast area radiating to right rib area. EXAM: CHEST  2 VIEW COMPARISON:  12/22/2015 chest radiograph and CT. FINDINGS: Stable heart size and mediastinal contours are within normal limits. Both lungs are clear. Bilateral rotator cuff repair postsurgical changes. Moderate multilevel degenerative changes of the thoracic spine. IMPRESSION: No acute pulmonary process identified. Electronically Signed   By: Kristine Garbe M.D.   On: 07/26/2017 03:26    ____________________________________________   PROCEDURES  Procedure(s) performed: None  Procedures  Critical Care performed: No  ____________________________________________   INITIAL IMPRESSION / ASSESSMENT AND PLAN / ED COURSE  As part of my medical decision making, I reviewed the following data within the [place] notes reviewed and incorporated, Old chart reviewed, Radiograph reviewed and Notes from prior ED visits.   62 year old male who presents with reproducible right lower rib pain.  Will obtain chest x-ray, administer NSAIDs and reassess.  Clinical Course as of Jul 27 399  Wed Jul 26, 2017  0401 Updated patient and spouse of x-ray results.  Will discharge home with prescriptions for NSAIDs, analgesia and he will follow-up closely with his PCP.  Strict return precautions given.  Both verbalize understanding and agree with plan of care.  [JS]    Clinical Course User Index [JS] Paulette Blanch, MD     ____________________________________________   FINAL CLINICAL IMPRESSION(S) / ED DIAGNOSES  Final diagnoses:  Chest wall pain     ED Discharge Orders    None       Note:  This document was prepared using Dragon voice recognition software and may include unintentional dictation errors.    Paulette Blanch, MD 07/26/17 [id number]

## 2018-07-13 ENCOUNTER — Emergency Department: Payer: Medicare Other

## 2018-07-13 ENCOUNTER — Emergency Department
Admission: EM | Admit: 2018-07-13 | Discharge: 2018-07-13 | Disposition: A | Discharge status: Home / Self Care | Payer: Medicare Other | Attending: Emergency Medicine | Admitting: Emergency Medicine

## 2018-07-13 ENCOUNTER — Encounter: Payer: Self-pay | Admitting: Emergency Medicine

## 2018-07-13 ENCOUNTER — Other Ambulatory Visit: Payer: Self-pay

## 2018-07-13 DIAGNOSIS — J449 Chronic obstructive pulmonary disease, unspecified: Secondary | ICD-10-CM | POA: Insufficient documentation

## 2018-07-13 DIAGNOSIS — M25561 Pain in right knee: Secondary | ICD-10-CM | POA: Diagnosis present

## 2018-07-13 DIAGNOSIS — F17210 Nicotine dependence, cigarettes, uncomplicated: Secondary | ICD-10-CM | POA: Diagnosis not present

## 2018-07-13 DIAGNOSIS — Z79899 Other long term (current) drug therapy: Secondary | ICD-10-CM | POA: Insufficient documentation

## 2018-07-13 DIAGNOSIS — M25461 Effusion, right knee: Secondary | ICD-10-CM | POA: Insufficient documentation

## 2018-07-13 MED ORDER — OXYCODONE-ACETAMINOPHEN 5-325 MG PO TABS
1.0000 | ORAL_TABLET | Freq: Once | ORAL | Status: AC
  Administered 2018-07-13: 1 via ORAL
  Filled 2018-07-13: qty 1

## 2018-07-13 MED ORDER — BUPIVACAINE HCL (PF) 0.5 % IJ SOLN
30.0000 mL | Freq: Once | INTRAMUSCULAR | Status: AC
  Administered 2018-07-13: 30 mL
  Filled 2018-07-13: qty 30

## 2018-07-13 MED ORDER — IBUPROFEN 600 MG PO TABS
600.0000 mg | ORAL_TABLET | Freq: Once | ORAL | Status: AC
  Administered 2018-07-13: 600 mg via ORAL
  Filled 2018-07-13: qty 1

## 2018-07-13 NOTE — Discharge Instructions (Addendum)
Results for orders placed or performed during the hospital encounter of 10/19/16  Lipase, blood  Result Value Ref Range   Lipase 25 11 - 51 U/L  Comprehensive metabolic panel  Result Value Ref Range   Sodium 136 135 - 145 mmol/L   Potassium 3.8 3.5 - 5.1 mmol/L   Chloride 101 101 - 111 mmol/L   CO2 24 22 - 32 mmol/L   Glucose, Bld 107 (H) 65 - 99 mg/dL   BUN 8 6 - 20 mg/dL   Creatinine, Ser 0.74 0.61 - 1.24 mg/dL   Calcium 9.9 8.9 - 10.3 mg/dL   Total Protein 8.4 (H) 6.5 - 8.1 g/dL   Albumin 4.4 3.5 - 5.0 g/dL   AST 46 (H) 15 - 41 U/L   ALT 28 17 - 63 U/L   Alkaline Phosphatase 66 38 - 126 U/L   Total Bilirubin 1.4 (H) 0.3 - 1.2 mg/dL   GFR calc non Af Amer >60 >60 mL/min   GFR calc Af Amer >60 >60 mL/min   Anion gap 11 5 - 15  CBC  Result Value Ref Range   WBC 4.1 3.8 - 10.6 K/uL   RBC 4.88 4.40 - 5.90 MIL/uL   Hemoglobin 16.1 13.0 - 18.0 g/dL   HCT 46.7 40.0 - 52.0 %   MCV 95.7 80.0 - 100.0 fL   MCH 33.0 26.0 - 34.0 pg   MCHC 34.5 32.0 - 36.0 g/dL   RDW 14.0 11.5 - 14.5 %   Platelets 155 150 - 440 K/uL  Troponin I  Result Value Ref Range   Troponin I <0.03 <0.03 ng/mL   Dg Knee Complete 4 Views Right  Result Date: 07/13/2018 CLINICAL DATA:  Right knee pain and swelling EXAM: RIGHT KNEE - COMPLETE 4+ VIEW COMPARISON:  None. FINDINGS: There is a large knee effusion. No acute fracture. Mild lateral subluxation of the patella is possible, though this may be projectional. IMPRESSION: Large right knee effusion with possible lateral subluxation of the patella. No fracture. Electronically Signed   By: Ulyses Jarred M.D.   On: 07/13/2018 02:00

## 2018-07-13 NOTE — ED Provider Notes (Signed)
[facility] Emergency Department Provider Note  ____________________________________________   First MD Initiated Contact with Patient 07/13/18 [phone]     (approximate)  I have reviewed the triage vital signs and the nursing notes.   HISTORY  Chief Complaint Knee Pain    HPI Russell Rivas is a 63 y.o. male who self presents to the emergency department with painful swelling to his right knee.  He has a longstanding history of difficulty with that knee and was recently seen by his orthopedic surgeon who aspirated 25 cc of fluid from his knee with improvement in his symptoms.  His effusion has recurred make it difficult to ambulate.  He has no history of gout.  No recent dental work.  No fevers or chills.  He is able to ambulate although with difficulty.  He says that he thinks he has "torn cartilage" in the knee.  No recent trauma.    Past Medical History:  Diagnosis Date  . Alcohol abuse    pt reports drinking at least one pint everyday.   . Anxiety   . Arthritis   . COPD (chronic obstructive pulmonary disease) (HCC)    NO inhalers  . Depression   . GERD (gastroesophageal reflux disease)   . Hypertension   . Hypothyroidism    does not take medication at this time.  has in the past  . Polio    as a child, caused poblems in right knee.    Patient Active Problem List   Diagnosis Date Noted  . Blood in stool   . Rectal polyp   . First degree hemorrhoids   . Alcohol dependence ([place]) 06/17/2015  . Increased MCV 06/17/2015  . Current tear of meniscus 12/05/2014  . 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 Gonalgia 09/17/2014  . Lumbar radiculopathy 04/15/2013  . Complete rotator cuff rupture of left shoulder 09/04/2012    Past Surgical History:  Procedure Laterality Date  . APPENDECTOMY    . CARDIAC CATHETERIZATION Left 10/02/2015   Procedure: Left Heart Cath and Coronary Angiography;  Surgeon: Dionisio David, MD;  Location: [place] [facility];  Service: Cardiovascular;  Laterality: Left;  . COLONOSCOPY WITH PROPOFOL N/A 12/08/2015   Procedure: COLONOSCOPY WITH PROPOFOL;  Surgeon: Lucilla Lame, MD;  Location: [facility] ENDOSCOPY;  Service: Endoscopy;  Laterality: N/A;  . KNEE ARTHROSCOPY Right 09/02/2015   Procedure: ARTHROSCOPY KNEE, debridement, microfracture;  Surgeon: Leanor Kail, MD;  Location: [facility] ORS;  Service: Orthopedics;  Laterality: Right;  . KNEE ARTHROSCOPY WITH MEDIAL MENISECTOMY Right 11/26/2014   Procedure: KNEE ARTHROSCOPY WITH MEDIAL MENISECTOMY;  Surgeon: Leanor Kail, MD;  Location: [facility] ORS;  Service: Orthopedics;  Laterality: Right;  . KNEE LIGAMENT RECONSTRUCTION Right 1959   pt did not have ligaments, ligaments were made and implanted.  Marland Kitchen KNEE SURGERY Left 1969   growth bone removed.  Marland Kitchen SHOULDER ARTHROSCOPY Bilateral    one in January and one in June    Prior to Admission medications   Medication Sig Start Date End Date Taking? Authorizing Provider  chlordiazePOXIDE (LIBRIUM) 10 MG capsule Day 1-2: Take 1 tablet PO Q6H Day 3-4: Take 1 tablet PO Q8H Day 5-6: Take 1 tablet PO Q12H Day 7-8: Take 1 tablet Daily. Patient not taking: Reported on 02/01/2016 06/16/15   Harvest Dark, MD  cyclobenzaprine (FLEXERIL) 5 MG tablet 1 PO q8hr prn pain/spasms 07/26/17   Paulette Blanch, MD  dicyclomine (BENTYL) 10 MG capsule Take 1 capsule (10 mg total) by mouth  4 (four) times daily -  before meals and at bedtime. **NEEDS FOLLOW UP APPOINTMENT** 02/17/17   Lucilla Lame, MD  diphenoxylate-atropine (LOMOTIL) 2.5-0.025 MG tablet Take 1 tablet by mouth 4 (four) times daily as needed for diarrhea or loose stools. 02/01/16   Lucilla Lame, MD  doxycycline (VIBRAMYCIN) 100 MG capsule Take 1 capsule (100 mg total) by mouth 2 (two) times daily. 10/20/16   Nance Pear, MD  ibuprofen (ADVIL,MOTRIN) 800 MG tablet Take 1 tablet (800 mg total) by mouth every 8 (eight) hours as needed for moderate pain. 07/26/17   Paulette Blanch, MD    levothyroxine (SYNTHROID, LEVOTHROID) 25 MCG tablet Take 25 mcg by mouth daily before breakfast.    [provider]  LORazepam (ATIVAN) 0.5 MG tablet Take 0.5 mg by mouth every 8 (eight) hours.    [provider]  metoprolol succinate (TOPROL-XL) 25 MG 24 hr tablet Take 25 mg by mouth every morning.    [provider]  Multiple Vitamins-Minerals (MULTIVITAMIN WITH MINERALS) tablet Take 1 tablet by mouth every morning.     [provider]  ondansetron (ZOFRAN) 4 MG tablet Take 1 tablet (4 mg total) by mouth every 8 (eight) hours as needed for nausea or vomiting. 10/20/16   Nance Pear, MD  oxyCODONE (ROXICODONE) 5 MG immediate release tablet Take 1 tablet (5 mg total) by mouth every 6 (six) hours as needed for breakthrough pain (Take as prescribed if you are still having pain despite taking naproxen and Flexeril as prescribed.). Do not drive while taking this medication. Patient not taking: Reported on 02/01/2016 12/22/15   Joanne Gavel, MD  oxyCODONE-acetaminophen (PERCOCET/ROXICET) 5-325 MG tablet Take 1 tablet by mouth every 4 (four) hours as needed for severe pain. Reported on 11/04/2015    [provider]  pregabalin (LYRICA) 150 MG capsule Take 150 mg by mouth.    [provider]    Allergies Hydrocodone and Penicillins  Family History  Problem Relation Age of Onset  . COPD Mother   . Heart disease Father     Social History Social History   Tobacco Use  . Smoking status: Current Every Day Smoker    Packs/day: 1.00    Years: 30.00    Pack years: 30.00    Types: Cigarettes  . Smokeless tobacco: Never Used  Substance Use Topics  . Alcohol use: No    Alcohol/week: 58.0 standard drinks    Types: 58 Cans of beer per week    Comment: 1 pint liquer per week. Pt reports he has stopped drinking like this in August of 2017  . Drug use: No    Review of Systems Constitutional: No fever/chills Cardiovascular: Denies chest  pain. Respiratory: Denies shortness of breath. Gastrointestinal: No abdominal pain.  No nausea, no vomiting.   Musculoskeletal: Positive for knee pain Neurological: Negative for headaches   ____________________________________________   PHYSICAL EXAM:  VITAL SIGNS: ED Triage Vitals  Enc Vitals Group     BP 07/13/18 0129 130/72     Pulse Rate 07/13/18 0129 83     Resp 07/13/18 0129 20     Temp 07/13/18 0129 98 F (36.7 C)     Temp Source 07/13/18 0129 Oral     SpO2 07/13/18 0129 97 %     Weight 07/13/18 0128 205 lb (93 kg)     Height 07/13/18 0128 5\' 7"  (1.702 m)     Head Circumference --      Peak Flow --  Pain Score 07/13/18 0128 8     Pain Loc --      Pain Edu? --      Excl. in [place]? --     Constitutional: Alert and oriented x4 ambulating with antalgic gait and appears obviously quite uncomfortable Head: Atraumatic. Cardiovascular: Regular rate and rhythm Respiratory: Normal respiratory effort.  No retractions. MSK: Right knee with large effusion and mildly warm although no skin changes.  Extensor mechanism intact and knee grossly stable.  Compartments are soft Neurologic:  Normal speech and language. No gross focal neurologic deficits are appreciated.  Skin:  Skin is warm, dry and intact. No rash noted.    ____________________________________________  LABS (all labs ordered are listed, but only abnormal results are displayed)  Labs Reviewed - No data to display   __________________________________________  EKG   ____________________________________________  RADIOLOGY  X-ray of the right knee reviewed by me with no fracture dislocation but does show a large right effusion ____________________________________________   DIFFERENTIAL includes but not limited to  Gout, septic joint, osteoarthritis, knee effusion   PROCEDURES  Procedure(s) performed: Yes  .Joint Aspiration/Arthrocentesis Date/Time: 07/13/2018 4:50 AM Performed by: Darel Hong, MD Authorized by: Darel Hong, MD   Consent:    Consent obtained:  Verbal   Consent given by:  Patient   Risks discussed:  Bleeding, incomplete drainage, poor cosmetic result, pain and infection   Alternatives discussed:  Alternative treatment Location:    Location:  Knee Anesthesia (see MAR for exact dosages):    Anesthesia method:  Local infiltration   Local anesthetic:  Bupivacaine 0.5% w/o epi Procedure details:    Needle gauge:  18 G   Ultrasound guidance: no     Approach:  Lateral   Aspirate amount:  40   Aspirate characteristics:  Serous   Steroid injected: no     Specimen collected: no   Post-procedure details:    Dressing:  Adhesive bandage   Patient tolerance of procedure:  Tolerated well, no immediate complications Comments:     Via lateral approach I easily aspirated 40 cc of serous fluid and then injected 6 cc of 0.5% bupivacaine without epinephrine into the joint space.  Normal sterile technique utilized.    Critical Care performed: no  ____________________________________________   INITIAL IMPRESSION / ASSESSMENT AND PLAN / ED COURSE  Pertinent labs & imaging results that were available during my care of the patient were reviewed by me and considered in my medical decision making (see chart for details).   As part of my medical decision making, I reviewed the following data within the [place] History obtained from family if available, nursing notes, old chart and ekg, as well as notes from prior ED visits.  The patient comes to the emergency department with recurrent effusion to his right knee that is causing moderate to severe pain.  He is previously had it drained by his orthopedic surgeon.  No history of gout.  Do not suspect septic joint.  I offered the patient drainage and injection with bupivacaine and was able to aspirate 40 cc of serous fluid and then injected 6 cc of bupivacaine with significant improvement in his pain.  An  Ace wrap and I will refer him back to orthopedic surgery.  Strict return precautions have been given and the patient verbalizes understanding and agreement with the plan.      ____________________________________________   FINAL CLINICAL IMPRESSION(S) / ED DIAGNOSES  Final diagnoses:  Effusion of right knee  NEW MEDICATIONS STARTED DURING THIS VISIT:  Discharge Medication List as of 07/13/2018  4:50 AM       Note:  This document was prepared using Dragon voice recognition software and may include unintentional dictation errors.     Darel Hong, MD 07/16/18 [phone]

## 2018-07-13 NOTE — ED Triage Notes (Signed)
Patient ambulatory to triage with steady gait, without difficulty or distress noted; pt reports right knee pain/swelling x wk; st hx torn meniscus

## 2018-07-13 NOTE — ED Notes (Signed)
Reviewed discharge instructions, follow-up care, and prescriptions with patient. Patient verbalized understanding of all information reviewed. Patient stable, with no distress noted at this time.    

## 2018-08-20 ENCOUNTER — Other Ambulatory Visit: Payer: Self-pay

## 2018-08-20 ENCOUNTER — Observation Stay: Payer: Medicare Other

## 2018-08-20 ENCOUNTER — Encounter: Payer: Self-pay | Admitting: Emergency Medicine

## 2018-08-20 ENCOUNTER — Emergency Department: Payer: Medicare Other

## 2018-08-20 ENCOUNTER — Observation Stay
Admission: EM | Admit: 2018-08-20 | Discharge: 2018-08-21 | Disposition: A | Discharge status: Home / Self Care | Payer: Medicare Other | Attending: Internal Medicine | Admitting: Internal Medicine

## 2018-08-20 DIAGNOSIS — F329 Major depressive disorder, single episode, unspecified: Secondary | ICD-10-CM | POA: Insufficient documentation

## 2018-08-20 DIAGNOSIS — F419 Anxiety disorder, unspecified: Secondary | ICD-10-CM | POA: Diagnosis not present

## 2018-08-20 DIAGNOSIS — Z716 Tobacco abuse counseling: Secondary | ICD-10-CM | POA: Insufficient documentation

## 2018-08-20 DIAGNOSIS — R531 Weakness: Secondary | ICD-10-CM | POA: Diagnosis present

## 2018-08-20 DIAGNOSIS — Z885 Allergy status to narcotic agent status: Secondary | ICD-10-CM | POA: Insufficient documentation

## 2018-08-20 DIAGNOSIS — I340 Nonrheumatic mitral (valve) insufficiency: Secondary | ICD-10-CM | POA: Diagnosis not present

## 2018-08-20 DIAGNOSIS — F1020 Alcohol dependence, uncomplicated: Secondary | ICD-10-CM | POA: Diagnosis not present

## 2018-08-20 DIAGNOSIS — J449 Chronic obstructive pulmonary disease, unspecified: Secondary | ICD-10-CM | POA: Diagnosis not present

## 2018-08-20 DIAGNOSIS — I493 Ventricular premature depolarization: Secondary | ICD-10-CM | POA: Insufficient documentation

## 2018-08-20 DIAGNOSIS — I10 Essential (primary) hypertension: Secondary | ICD-10-CM | POA: Insufficient documentation

## 2018-08-20 DIAGNOSIS — Z79899 Other long term (current) drug therapy: Secondary | ICD-10-CM | POA: Insufficient documentation

## 2018-08-20 DIAGNOSIS — E785 Hyperlipidemia, unspecified: Secondary | ICD-10-CM | POA: Insufficient documentation

## 2018-08-20 DIAGNOSIS — Z8249 Family history of ischemic heart disease and other diseases of the circulatory system: Secondary | ICD-10-CM | POA: Diagnosis not present

## 2018-08-20 DIAGNOSIS — E669 Obesity, unspecified: Secondary | ICD-10-CM | POA: Insufficient documentation

## 2018-08-20 DIAGNOSIS — Z880 Allergy status to penicillin: Secondary | ICD-10-CM | POA: Insufficient documentation

## 2018-08-20 DIAGNOSIS — R0602 Shortness of breath: Secondary | ICD-10-CM | POA: Diagnosis present

## 2018-08-20 DIAGNOSIS — F17210 Nicotine dependence, cigarettes, uncomplicated: Secondary | ICD-10-CM | POA: Insufficient documentation

## 2018-08-20 DIAGNOSIS — E039 Hypothyroidism, unspecified: Secondary | ICD-10-CM | POA: Diagnosis not present

## 2018-08-20 DIAGNOSIS — G459 Transient cerebral ischemic attack, unspecified: Secondary | ICD-10-CM | POA: Diagnosis present

## 2018-08-20 DIAGNOSIS — M1990 Unspecified osteoarthritis, unspecified site: Secondary | ICD-10-CM | POA: Diagnosis not present

## 2018-08-20 DIAGNOSIS — Z23 Encounter for immunization: Secondary | ICD-10-CM | POA: Insufficient documentation

## 2018-08-20 DIAGNOSIS — K219 Gastro-esophageal reflux disease without esophagitis: Secondary | ICD-10-CM | POA: Diagnosis not present

## 2018-08-20 DIAGNOSIS — M6281 Muscle weakness (generalized): Secondary | ICD-10-CM | POA: Diagnosis not present

## 2018-08-20 LAB — URINE DRUG SCREEN, QUALITATIVE (ARMC ONLY)
Amphetamines, Ur Screen: NOT DETECTED
Barbiturates, Ur Screen: NOT DETECTED
Benzodiazepine, Ur Scrn: NOT DETECTED
Cannabinoid 50 Ng, Ur ~~LOC~~: NOT DETECTED
Cocaine Metabolite,Ur ~~LOC~~: NOT DETECTED
MDMA (Ecstasy)Ur Screen: NOT DETECTED
Methadone Scn, Ur: NOT DETECTED
Opiate, Ur Screen: NOT DETECTED
Phencyclidine (PCP) Ur S: NOT DETECTED
Tricyclic, Ur Screen: NOT DETECTED

## 2018-08-20 LAB — HEPATIC FUNCTION PANEL
ALT: 42 U/L (ref 0–44)
AST: 88 U/L — ABNORMAL HIGH (ref 15–41)
Albumin: 3.9 g/dL (ref 3.5–5.0)
Alkaline Phosphatase: 42 U/L (ref 38–126)
Bilirubin, Direct: 0.3 mg/dL — ABNORMAL HIGH (ref 0.0–0.2)
Indirect Bilirubin: 1.3 mg/dL — ABNORMAL HIGH (ref 0.3–0.9)
Total Bilirubin: 1.6 mg/dL — ABNORMAL HIGH (ref 0.3–1.2)
Total Protein: 7.4 g/dL (ref 6.5–8.1)

## 2018-08-20 LAB — TROPONIN I: Troponin I: <0.03 ng/mL (ref <0.03)

## 2018-08-20 LAB — CBC WITH DIFFERENTIAL/PLATELET
Abs Immature Granulocytes: 0.01 10*3/uL (ref 0.00–0.07)
Basophils Absolute: 0 10*3/uL (ref 0.0–0.1)
Basophils Relative: 1 %
Eosinophils Absolute: 0.1 10*3/uL (ref 0.0–0.5)
Eosinophils Relative: 1 %
HCT: 49 % (ref 39.0–52.0)
Hemoglobin: 16.2 g/dL (ref 13.0–17.0)
Immature Granulocytes: 0 %
Lymphocytes Relative: 23 %
Lymphs Abs: 1.4 10*3/uL (ref 0.7–4.0)
MCH: 30.2 pg (ref 26.0–34.0)
MCHC: 33.1 g/dL (ref 30.0–36.0)
MCV: 91.2 fL (ref 80.0–100.0)
Monocytes Absolute: 0.6 10*3/uL (ref 0.1–1.0)
Monocytes Relative: 9 %
Neutro Abs: 4 10*3/uL (ref 1.7–7.7)
Neutrophils Relative %: 66 %
Platelets: 145 10*3/uL — ABNORMAL LOW (ref 150–400)
RBC: 5.37 MIL/uL (ref 4.22–5.81)
RDW: 16.6 % — ABNORMAL HIGH (ref 11.5–15.5)
WBC: 6 10*3/uL (ref 4.0–10.5)
nRBC: 0 % (ref 0.0–0.2)

## 2018-08-20 LAB — INFLUENZA PANEL BY PCR (TYPE A & B)
Influenza A By PCR: NEGATIVE
Influenza B By PCR: NEGATIVE

## 2018-08-20 LAB — BASIC METABOLIC PANEL
Anion gap: 11 (ref 5–15)
BUN: 5 mg/dL — ABNORMAL LOW (ref 8–23)
CO2: 23 mmol/L (ref 22–32)
Calcium: 8.8 mg/dL — ABNORMAL LOW (ref 8.9–10.3)
Chloride: 104 mmol/L (ref 98–111)
Creatinine, Ser: 0.74 mg/dL (ref 0.61–1.24)
GFR calc Af Amer: >60 mL/min (ref >60)
GFR calc non Af Amer: >60 mL/min (ref >60)
Glucose, Bld: 103 mg/dL — ABNORMAL HIGH (ref 70–99)
Potassium: 3.5 mmol/L (ref 3.5–5.1)
Sodium: 138 mmol/L (ref 135–145)

## 2018-08-20 LAB — ETHANOL: Alcohol, Ethyl (B): <10 mg/dL (ref <10)

## 2018-08-20 LAB — TSH: TSH: 8.56 u[IU]/mL — ABNORMAL HIGH (ref 0.350–4.500)

## 2018-08-20 LAB — BRAIN NATRIURETIC PEPTIDE: B Natriuretic Peptide: 33 pg/mL (ref 0.0–100.0)

## 2018-08-20 MED ORDER — LORAZEPAM 0.5 MG PO TABS
0.5000 mg | ORAL_TABLET | Freq: Three times a day (TID) | ORAL | Status: DC
  Administered 2018-08-20 – 2018-08-21 (×3): 0.5 mg via ORAL
  Filled 2018-08-20 (×3): qty 1

## 2018-08-20 MED ORDER — HYDRALAZINE HCL 20 MG/ML IJ SOLN: 10.0000 mg | Freq: Four times a day (QID) | INTRAMUSCULAR | Status: DC | PRN

## 2018-08-20 MED ORDER — SODIUM CHLORIDE 0.9 % IV SOLN: INTRAVENOUS | Status: DC

## 2018-08-20 MED ORDER — ACETAMINOPHEN 650 MG RE SUPP: 650.0000 mg | RECTAL | Status: DC | PRN

## 2018-08-20 MED ORDER — ASPIRIN 81 MG PO CHEW
324.0000 mg | CHEWABLE_TABLET | Freq: Once | ORAL | Status: AC
  Administered 2018-08-20: 324 mg via ORAL
  Filled 2018-08-20: qty 4

## 2018-08-20 MED ORDER — GUAIFENESIN-DM 100-10 MG/5ML PO SYRP: 5.0000 mL | ORAL_SOLUTION | ORAL | Status: DC | PRN

## 2018-08-20 MED ORDER — THIAMINE HCL 100 MG/ML IJ SOLN
100.0000 mg | Freq: Every day | INTRAMUSCULAR | Status: DC
  Filled 2018-08-20: qty 2

## 2018-08-20 MED ORDER — ASPIRIN 81 MG PO CHEW
81.0000 mg | CHEWABLE_TABLET | Freq: Every day | ORAL | Status: DC
  Administered 2018-08-21: 11:00:00 81 mg via ORAL
  Filled 2018-08-20: qty 1

## 2018-08-20 MED ORDER — GABAPENTIN 100 MG PO CAPS: 100.0000 mg | ORAL_CAPSULE | Freq: Three times a day (TID) | ORAL | Status: DC

## 2018-08-20 MED ORDER — LEVOTHYROXINE SODIUM 50 MCG PO TABS
25.0000 ug | ORAL_TABLET | Freq: Every day | ORAL | Status: DC
  Administered 2018-08-21: 25 ug via ORAL
  Filled 2018-08-20: qty 1

## 2018-08-20 MED ORDER — OXYCODONE-ACETAMINOPHEN 5-325 MG PO TABS: 1.0000 | ORAL_TABLET | ORAL | Status: DC | PRN

## 2018-08-20 MED ORDER — PNEUMOCOCCAL VAC POLYVALENT 25 MCG/0.5ML IJ INJ
0.5000 mL | INJECTION | INTRAMUSCULAR | Status: AC
  Administered 2018-08-21: 11:00:00 0.5 mL via INTRAMUSCULAR
  Filled 2018-08-20: qty 0.5

## 2018-08-20 MED ORDER — INFLUENZA VAC SPLIT QUAD 0.5 ML IM SUSY
0.5000 mL | PREFILLED_SYRINGE | INTRAMUSCULAR | Status: AC
  Administered 2018-08-21: 0.5 mL via INTRAMUSCULAR
  Filled 2018-08-20: qty 0.5

## 2018-08-20 MED ORDER — PREGABALIN 75 MG PO CAPS: 150.0000 mg | ORAL_CAPSULE | Freq: Every day | ORAL | Status: DC

## 2018-08-20 MED ORDER — MAGNESIUM OXIDE 400 MG PO TABS: 400.0000 mg | ORAL_TABLET | Freq: Every day | ORAL | Status: DC

## 2018-08-20 MED ORDER — LORAZEPAM 1 MG PO TABS: 1.0000 mg | ORAL_TABLET | Freq: Four times a day (QID) | ORAL | Status: DC | PRN

## 2018-08-20 MED ORDER — NICOTINE 14 MG/24HR TD PT24
14.0000 mg | MEDICATED_PATCH | Freq: Every day | TRANSDERMAL | Status: DC
  Administered 2018-08-20 – 2018-08-21 (×3): 14 mg via TRANSDERMAL
  Filled 2018-08-20 (×3): qty 1

## 2018-08-20 MED ORDER — MAGNESIUM OXIDE 400 (241.3 MG) MG PO TABS
400.0000 mg | ORAL_TABLET | Freq: Every day | ORAL | Status: DC
  Administered 2018-08-20 – 2018-08-21 (×2): 400 mg via ORAL
  Filled 2018-08-20 (×2): qty 1

## 2018-08-20 MED ORDER — ATORVASTATIN CALCIUM 20 MG PO TABS
40.0000 mg | ORAL_TABLET | Freq: Every day | ORAL | Status: DC
  Administered 2018-08-20: 18:00:00 40 mg via ORAL
  Filled 2018-08-20: qty 2

## 2018-08-20 MED ORDER — FOLIC ACID 1 MG PO TABS
1.0000 mg | ORAL_TABLET | Freq: Every day | ORAL | Status: DC
  Administered 2018-08-20 – 2018-08-21 (×2): 1 mg via ORAL
  Filled 2018-08-20 (×2): qty 1

## 2018-08-20 MED ORDER — ADULT MULTIVITAMIN W/MINERALS CH
1.0000 | ORAL_TABLET | Freq: Every day | ORAL | Status: DC
  Administered 2018-08-20 – 2018-08-21 (×2): 1 via ORAL
  Filled 2018-08-20 (×2): qty 1

## 2018-08-20 MED ORDER — ACETAMINOPHEN 160 MG/5ML PO SOLN
650.0000 mg | ORAL | Status: DC | PRN
  Filled 2018-08-20: qty 20.3

## 2018-08-20 MED ORDER — STROKE: EARLY STAGES OF RECOVERY BOOK
Freq: Once | Status: AC
  Administered 2018-08-20: 13:00:00

## 2018-08-20 MED ORDER — VITAMIN B-1 100 MG PO TABS
100.0000 mg | ORAL_TABLET | Freq: Every day | ORAL | Status: DC
  Administered 2018-08-20 – 2018-08-21 (×2): 100 mg via ORAL
  Filled 2018-08-20 (×2): qty 1

## 2018-08-20 MED ORDER — ACETAMINOPHEN 325 MG PO TABS
650.0000 mg | ORAL_TABLET | ORAL | Status: DC | PRN
  Administered 2018-08-20: 22:00:00 650 mg via ORAL
  Filled 2018-08-20: qty 2

## 2018-08-20 MED ORDER — METOPROLOL SUCCINATE ER 50 MG PO TB24
25.0000 mg | ORAL_TABLET | ORAL | Status: DC
  Administered 2018-08-20 – 2018-08-21 (×2): 25 mg via ORAL
  Filled 2018-08-20 (×3): qty 1

## 2018-08-20 MED ORDER — ENOXAPARIN SODIUM 40 MG/0.4ML ~~LOC~~ SOLN
40.0000 mg | Freq: Every day | SUBCUTANEOUS | Status: DC
  Administered 2018-08-20 – 2018-08-21 (×2): 40 mg via SUBCUTANEOUS
  Filled 2018-08-20 (×2): qty 0.4

## 2018-08-20 MED ORDER — SENNOSIDES-DOCUSATE SODIUM 8.6-50 MG PO TABS: 1.0000 | ORAL_TABLET | Freq: Every evening | ORAL | Status: DC | PRN

## 2018-08-20 MED ORDER — LORAZEPAM 2 MG/ML IJ SOLN: 1.0000 mg | Freq: Four times a day (QID) | INTRAMUSCULAR | Status: DC | PRN

## 2018-08-20 NOTE — Care Management Obs Status (Signed)
[place] NOTIFICATION   Patient Details  Name: Russell Rivas MRN: [id number] Date of Birth: August 11, 1955   Medicare Observation Status Notification Given:  Yes    Shelbie Hutching, RN 08/20/2018, 4:40 PM

## 2018-08-20 NOTE — Care Management Note (Signed)
Case Management Note  Patient Details  Name: PELLEGRINO Rivas MRN: [id number] Date of Birth: February 08, 1956  Subjective/Objective:  Patient is placed under observation for TIA.  Patient is from home where he lives with his wife and is independent in all ADL's.  PCP verified as Russell Rivas, he has not seen Russell Rivas yet but will make and appointment for follow up after hospital discharge.  Patient reports he also previously saw Russell Rivas for cardiology, last heart cath 2 years ago which showed 40% blockage not significant at the time for intervention or follow up.  Patient reports that he currently does not take any medications other than a multivitamin.  No discharge needs identified at this time. Russell Clay RN BSN                     Action/Plan:   Expected Discharge Date:                  Expected Discharge Plan:  Home/Self Care  In-House Referral:     Discharge planning Services     Post Acute Care Choice:    Choice offered to:     DME Arranged:    DME Agency:     HH Arranged:    [place] Agency:     Status of Service:  Completed, signed off  If discussed at [employer] of Stay Meetings, dates discussed:    Additional Comments:  Russell Hutching, RN 08/20/2018, 4:41 PM

## 2018-08-20 NOTE — H&P (Signed)
[facility] [place] NAME: Russell Rivas    MR#:  [id number]  DATE OF BIRTH:  13-May-1956  DATE OF ADMISSION:  08/20/2018  PRIMARY CARE PHYSICIAN: Baxter Hire, MD   REQUESTING/REFERRING PHYSICIAN: Dr. Clearnce Hasten.  CHIEF COMPLAINT:   Chief Complaint  Patient presents with  . Hypertension   Facial numbness on the left side today, left arm numbness and weakness 3 days ago. HISTORY OF PRESENT ILLNESS:  Russell Rivas  is a 63 y.o. male with a known history of alcohol abuse, anxiety, arthritis, COPD, depression, GERD, hypertension, hypothyroidism.  The patient presents the ED with above chief complaints. The patient complains of left arm weakness and numbness for about 20 to 25 minutes 3 days ago, which resolved.  He also complains of generalized weakness, shortness of breath with activity and still has facial numbness tingling left side today.  His blood pressure is elevated up to 200s/100s.  He denies any dysphagia, slurred speech or incontinence.  CAT scan of head is unremarkable.  He is given aspirin in the ED.  ED physician request admission for TIA work-up. PAST MEDICAL HISTORY:   Past Medical History:  Diagnosis Date  . Alcohol abuse    pt reports drinking at least one pint everyday.   . Anxiety   . Arthritis   . COPD (chronic obstructive pulmonary disease) (HCC)    NO inhalers  . Depression   . GERD (gastroesophageal reflux disease)   . Hypertension   . Hypothyroidism    does not take medication at this time.  has in the past  . Polio    as a child, caused poblems in right knee.    PAST SURGICAL HISTORY:   Past Surgical History:  Procedure Laterality Date  . APPENDECTOMY    . CARDIAC CATHETERIZATION Left 10/02/2015   Procedure: Left Heart Cath and Coronary Angiography;  Surgeon: Dionisio David, MD;  Location: [place] [facility];  Service: Cardiovascular;  Laterality: Left;  . COLONOSCOPY WITH PROPOFOL N/A 12/08/2015   Procedure: COLONOSCOPY WITH PROPOFOL;  Surgeon: Lucilla Lame, MD;  Location: [facility] ENDOSCOPY;  Service: Endoscopy;  Laterality: N/A;  . KNEE ARTHROSCOPY Right 09/02/2015   Procedure: ARTHROSCOPY KNEE, debridement, microfracture;  Surgeon: Leanor Kail, MD;  Location: [facility] ORS;  Service: Orthopedics;  Laterality: Right;  . KNEE ARTHROSCOPY WITH MEDIAL MENISECTOMY Right 11/26/2014   Procedure: KNEE ARTHROSCOPY WITH MEDIAL MENISECTOMY;  Surgeon: Leanor Kail, MD;  Location: [facility] ORS;  Service: Orthopedics;  Laterality: Right;  . KNEE LIGAMENT RECONSTRUCTION Right 1959   pt did not have ligaments, ligaments were made and implanted.  Marland Kitchen KNEE SURGERY Left 1969   growth bone removed.  Marland Kitchen SHOULDER ARTHROSCOPY Bilateral    one in January and one in June    SOCIAL HISTORY:   Social History   Tobacco Use  . Smoking status: Current Every Day Smoker    Packs/day: 1.00    Years: 30.00    Pack years: 30.00    Types: Cigarettes  . Smokeless tobacco: Never Used  Substance Use Topics  . Alcohol use: No    Alcohol/week: 58.0 standard drinks    Types: 58 Cans of beer per week    Comment: 1 pint liquer per week. Pt reports he has stopped drinking like this in August of 2017    FAMILY HISTORY:   Family History  Problem Relation Age of Onset  . COPD Mother   . Heart disease Father  DRUG ALLERGIES:   Allergies  Allergen Reactions  . Hydrocodone Itching  . Penicillins Rash    REVIEW OF SYSTEMS:   Review of Systems  Constitutional: Positive for malaise/fatigue. Negative for chills and fever.  HENT: Negative for sore throat.   Eyes: Negative for blurred vision and double vision.  Respiratory: Negative for cough, hemoptysis, shortness of breath, wheezing and stridor.   Cardiovascular: Negative for chest pain, palpitations, orthopnea and leg swelling.  Gastrointestinal: Negative for abdominal pain, blood in stool, diarrhea, melena, nausea and vomiting.  Genitourinary: Negative for  dysuria, flank pain and hematuria.  Musculoskeletal: Negative for back pain and joint pain.  Skin: Negative for rash.  Neurological: Positive for tingling, tremors, sensory change and focal weakness. Negative for dizziness, speech change, seizures, loss of consciousness, weakness and headaches.  Endo/Heme/Allergies: Negative for polydipsia.  Psychiatric/Behavioral: Negative for depression. The patient is nervous/anxious.     MEDICATIONS AT HOME:   Prior to Admission medications   Medication Sig Start Date End Date Taking? Authorizing Provider  Multiple Vitamins-Minerals (MULTIVITAMIN WITH MINERALS) tablet Take 1 tablet by mouth every morning.    Yes [provider]  chlordiazePOXIDE (LIBRIUM) 10 MG capsule Day 1-2: Take 1 tablet PO Q6H Day 3-4: Take 1 tablet PO Q8H Day 5-6: Take 1 tablet PO Q12H Day 7-8: Take 1 tablet Daily. Patient not taking: Reported on 02/01/2016 06/16/15   Harvest Dark, MD  cyclobenzaprine (FLEXERIL) 5 MG tablet 1 PO q8hr prn pain/spasms Patient not taking: Reported on 08/20/2018 07/26/17   Paulette Blanch, MD  dicyclomine (BENTYL) 10 MG capsule Take 1 capsule (10 mg total) by mouth 4 (four) times daily -  before meals and at bedtime. **NEEDS FOLLOW UP APPOINTMENT** Patient not taking: Reported on 08/20/2018 02/17/17   Lucilla Lame, MD  diphenoxylate-atropine (LOMOTIL) 2.5-0.025 MG tablet Take 1 tablet by mouth 4 (four) times daily as needed for diarrhea or loose stools. Patient not taking: Reported on 08/20/2018 02/01/16   Lucilla Lame, MD  doxycycline (VIBRAMYCIN) 100 MG capsule Take 1 capsule (100 mg total) by mouth 2 (two) times daily. Patient not taking: Reported on 08/20/2018 10/20/16   Nance Pear, MD  gabapentin (NEURONTIN) 100 MG capsule Take 100 mg by mouth 3 (three) times daily.    [provider]  ibuprofen (ADVIL,MOTRIN) 800 MG tablet Take 1 tablet (800 mg total) by mouth every 8 (eight) hours as needed for moderate pain. Patient not  taking: Reported on 08/20/2018 07/26/17   Paulette Blanch, MD  levothyroxine (SYNTHROID, LEVOTHROID) 25 MCG tablet Take 25 mcg by mouth daily before breakfast.    [provider]  LORazepam (ATIVAN) 0.5 MG tablet Take 0.5 mg by mouth every 8 (eight) hours.    [provider]  magnesium oxide (MAG-OX) 400 MG tablet Take 400 mg by mouth daily.    [provider]  metoprolol succinate (TOPROL-XL) 25 MG 24 hr tablet Take 25 mg by mouth every morning.    [provider]  ondansetron (ZOFRAN) 4 MG tablet Take 1 tablet (4 mg total) by mouth every 8 (eight) hours as needed for nausea or vomiting. Patient not taking: Reported on 08/20/2018 10/20/16   Nance Pear, MD  oxyCODONE (ROXICODONE) 5 MG immediate release tablet Take 1 tablet (5 mg total) by mouth every 6 (six) hours as needed for breakthrough pain (Take as prescribed if you are still having pain despite taking naproxen and Flexeril as prescribed.). Do not drive while taking this medication. Patient not taking: Reported  on 02/01/2016 12/22/15   Joanne Gavel, MD  oxyCODONE-acetaminophen (PERCOCET/ROXICET) 5-325 MG tablet Take 1 tablet by mouth every 4 (four) hours as needed for severe pain. Reported on 11/04/2015    [provider]  pregabalin (LYRICA) 150 MG capsule Take 150 mg by mouth.    [provider]  vitamin C (ASCORBIC ACID) 500 MG tablet Take 500 mg by mouth daily.    [provider]      VITAL SIGNS:  Blood pressure (!) 149/90, pulse 100, temperature (!) 97.5 F (36.4 C), temperature source Oral, resp. rate 19, SpO2 96 %.  PHYSICAL EXAMINATION:  Physical Exam  GENERAL:  63 y.o.-year-old patient lying in the bed with no acute distress.  Obesity. EYES: Pupils equal, round, reactive to light and accommodation. No scleral icterus. Extraocular muscles intact.  HEENT: Head atraumatic, normocephalic. Oropharynx and nasopharynx clear.  NECK:  Supple, no jugular venous distention. No  thyroid enlargement, no tenderness.  LUNGS: Normal breath sounds bilaterally, no wheezing, rales,rhonchi or crepitation. No use of accessory muscles of respiration.  CARDIOVASCULAR: S1, S2 normal. No murmurs, rubs, or gallops.  ABDOMEN: Soft, nontender, nondistended. Bowel sounds present. No organomegaly or mass.  EXTREMITIES: No pedal edema, cyanosis, or clubbing.  NEUROLOGIC: Cranial nerves II through XII are intact. Muscle strength 5/5 in all extremities. Sensation intact. Gait not checked.  PSYCHIATRIC: The patient is alert and oriented x 3.  SKIN: No obvious rash, lesion, or ulcer.   LABORATORY PANEL:   CBC Recent Labs  Lab 08/20/18 0716  WBC 6.0  HGB 16.2  HCT 49.0  PLT 145*   ------------------------------------------------------------------------------------------------------------------  Chemistries  Recent Labs  Lab 08/20/18 0716  NA 138  K 3.5  CL 104  CO2 23  GLUCOSE 103*  BUN 5*  CREATININE 0.74  CALCIUM 8.8*  AST 88*  ALT 42  ALKPHOS 42  BILITOT 1.6*   ------------------------------------------------------------------------------------------------------------------  Cardiac Enzymes Recent Labs  Lab 08/20/18 0716  TROPONINI <0.03   ------------------------------------------------------------------------------------------------------------------  RADIOLOGY:  Dg Chest 2 View  Result Date: 08/20/2018 CLINICAL DATA:  Hypertension since yesterday.  Shortness of breath. EXAM: CHEST - 2 VIEW COMPARISON:  07/26/2017. FINDINGS: Trachea is midline. Heart size normal. Biapical pleural thickening. Lungs are otherwise clear. No pleural fluid. Degenerative changes are seen in the spine. IMPRESSION: No acute findings. Electronically Signed   By: Lorin Picket M.D.   On: 08/20/2018 07:44   Ct Head Wo Contrast  Result Date: 08/20/2018 CLINICAL DATA:  High blood pressure since yesterday. Left-sided numbness and weakness. EXAM: CT HEAD WITHOUT CONTRAST TECHNIQUE:  Contiguous axial images were obtained from the base of the skull through the vertex without intravenous contrast. COMPARISON:  None. FINDINGS: Brain: No evidence of acute infarction, hemorrhage, hydrocephalus, extra-axial collection or mass lesion/mass effect. Vascular: No hyperdense vessel or unexpected calcification. Skull: Normal. Negative for fracture or focal lesion. Sinuses/Orbits: No acute finding. Other: None. IMPRESSION: No acute intracranial abnormalities identified. Electronically Signed   By: Dorise Bullion III M.D   On: 08/20/2018 09:41      IMPRESSION AND PLAN:   TIA, rule out CVA. The patient will be placed for observation. Continue aspirin, start Lipitor, follow-up MRI/MRA of brain, echocardiograph and carotid duplex.  Hypertension, accelerated. Continue Lopressor, IV hydralazine PRN. COPD.  Stable. Alcohol abuse.  Start CIWA protocol.  Tobacco abuse.  Smoking cessation was counseled for 45 minutes, nicotine patch. Obesity.  Diet control and follow-up PCP.  All the records are reviewed and case discussed with ED provider.  Management plans discussed with the patient, his wife and they are in agreement.  CODE STATUS: Full code  TOTAL TIME TAKING CARE OF THIS PATIENT: 36 minutes.    Demetrios Loll M.D on 08/20/2018 at 10:51 AM  Between 7am to 6pm - Pager - [phone]  After 6pm go to [email or web address] - Proofreader  [facility]  Office  [phone]  CC: Primary care physician; Baxter Hire, MD   Note: This dictation was prepared with Dragon dictation along with smaller phrase technology. Any transcriptional errors that result from this process are unin

## 2018-08-20 NOTE — ED Triage Notes (Signed)
PT from home via EMS with c/o high BP at home since yesterdau , no dx. PT denies any pain. MD at bedside

## 2018-08-20 NOTE — ED Provider Notes (Signed)
[facility] Emergency Department Provider Note  ____________________________________________   First MD Initiated Contact with Patient 08/20/18 [phone]     (approximate)  I have reviewed the triage vital signs and the nursing notes.   HISTORY  Chief Complaint Hypertension   HPI Russell Rivas is a 63 y.o. male with a history of COPD, hypertension and hypothyroidism who is presenting to the emergency department today complaining of high blood pressure.  The patient states that he has been feeling generally weak as well as short of breath with activity since this past Friday.  He says that he began taking his blood pressure and has been consistently in the 180s to 200s over 100s.  Patient states that his father died of heart disease at 64 years old.  Has been seen by cardiology with a catheterization 2 years ago with a maximum stenosis of 40%.  No intervention was done at that time.  Patient says that he smokes approximately 1 pack of cigarettes per day.  Occasional alcohol and no drugs.  Denying any pain.  However, says he had numbness left side of his face as well as left upper extremity this past Friday which resolved spontaneously.  He has not had any further numbness.  Denies any cough or runny nose.  Denies fever.  Says that he called the ambulance this morning when his blood pressure was persistently elevated on home monitor.   Past Medical History:  Diagnosis Date  . Alcohol abuse    pt reports drinking at least one pint everyday.   . Anxiety   . Arthritis   . COPD (chronic obstructive pulmonary disease) (HCC)    NO inhalers  . Depression   . GERD (gastroesophageal reflux disease)   . Hypertension   . Hypothyroidism    does not take medication at this time.  has in the past  . Polio    as a child, caused poblems in right knee.    Patient Active Problem List   Diagnosis Date Noted  . Blood in stool   . Rectal polyp   . First degree hemorrhoids   .  Alcohol dependence ([place]) 06/17/2015  . Increased MCV 06/17/2015  . Current tear of meniscus 12/05/2014  . 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 Gonalgia 09/17/2014  . Lumbar radiculopathy 04/15/2013  . Complete rotator cuff rupture of left shoulder 09/04/2012    Past Surgical History:  Procedure Laterality Date  . APPENDECTOMY    . CARDIAC CATHETERIZATION Left 10/02/2015   Procedure: Left Heart Cath and Coronary Angiography;  Surgeon: Dionisio Katesha Eichel, MD;  Location: [facility];  Service: Cardiovascular;  Laterality: Left;  . COLONOSCOPY WITH PROPOFOL N/A 12/08/2015   Procedure: COLONOSCOPY WITH PROPOFOL;  Surgeon: Lucilla Lame, MD;  Location: [facility];  Service: Endoscopy;  Laterality: N/A;  . KNEE ARTHROSCOPY Right 09/02/2015   Procedure: ARTHROSCOPY KNEE, debridement, microfracture;  Surgeon: Leanor Kail, MD;  Location: [facility];  Service: Orthopedics;  Laterality: Right;  . KNEE ARTHROSCOPY WITH MEDIAL MENISECTOMY Right 11/26/2014   Procedure: KNEE ARTHROSCOPY WITH MEDIAL MENISECTOMY;  Surgeon: Leanor Kail, MD;  Location: [facility];  Service: Orthopedics;  Laterality: Right;  . KNEE LIGAMENT RECONSTRUCTION Right 1959   pt did not have ligaments, ligaments were made and implanted.  Marland Kitchen KNEE SURGERY Left 1969   growth bone removed.  Marland Kitchen SHOULDER ARTHROSCOPY Bilateral    one in January and one in June    Prior to Admission medications   Medication  Sig Start Date End Date Taking? Authorizing Provider  chlordiazePOXIDE (LIBRIUM) 10 MG capsule Day 1-2: Take 1 tablet PO Q6H Day 3-4: Take 1 tablet PO Q8H Day 5-6: Take 1 tablet PO Q12H Day 7-8: Take 1 tablet Daily. Patient not taking: Reported on 02/01/2016 06/16/15   Harvest Dark, MD  cyclobenzaprine (FLEXERIL) 5 MG tablet 1 PO q8hr prn pain/spasms 07/26/17   Paulette Blanch, MD  dicyclomine (BENTYL) 10 MG capsule Take 1 capsule (10 mg total) by mouth 4 (four) times daily -  before meals and at bedtime. **NEEDS FOLLOW  UP APPOINTMENT** 02/17/17   Lucilla Lame, MD  diphenoxylate-atropine (LOMOTIL) 2.5-0.025 MG tablet Take 1 tablet by mouth 4 (four) times daily as needed for diarrhea or loose stools. 02/01/16   Lucilla Lame, MD  doxycycline (VIBRAMYCIN) 100 MG capsule Take 1 capsule (100 mg total) by mouth 2 (two) times daily. 10/20/16   Nance Pear, MD  gabapentin (NEURONTIN) 100 MG capsule Take 100 mg by mouth 3 (three) times daily.    [provider]  ibuprofen (ADVIL,MOTRIN) 800 MG tablet Take 1 tablet (800 mg total) by mouth every 8 (eight) hours as needed for moderate pain. 07/26/17   Paulette Blanch, MD  levothyroxine (SYNTHROID, LEVOTHROID) 25 MCG tablet Take 25 mcg by mouth daily before breakfast.    [provider]  LORazepam (ATIVAN) 0.5 MG tablet Take 0.5 mg by mouth every 8 (eight) hours.    [provider]  magnesium oxide (MAG-OX) 400 MG tablet Take 400 mg by mouth daily.    [provider]  metoprolol succinate (TOPROL-XL) 25 MG 24 hr tablet Take 25 mg by mouth every morning.    [provider]  Multiple Vitamins-Minerals (MULTIVITAMIN WITH MINERALS) tablet Take 1 tablet by mouth every morning.     [provider]  ondansetron (ZOFRAN) 4 MG tablet Take 1 tablet (4 mg total) by mouth every 8 (eight) hours as needed for nausea or vomiting. 10/20/16   Nance Pear, MD  oxyCODONE (ROXICODONE) 5 MG immediate release tablet Take 1 tablet (5 mg total) by mouth every 6 (six) hours as needed for breakthrough pain (Take as prescribed if you are still having pain despite taking naproxen and Flexeril as prescribed.). Do not drive while taking this medication. Patient not taking: Reported on 02/01/2016 12/22/15   Joanne Gavel, MD  oxyCODONE-acetaminophen (PERCOCET/ROXICET) 5-325 MG tablet Take 1 tablet by mouth every 4 (four) hours as needed for severe pain. Reported on 11/04/2015    [provider]  pregabalin (LYRICA) 150 MG capsule Take 150 mg by  mouth.    [provider]  vitamin C (ASCORBIC ACID) 500 MG tablet Take 500 mg by mouth daily.    [provider]    Allergies Hydrocodone and Penicillins  Family History  Problem Relation Age of Onset  . COPD Mother   . Heart disease Father     Social History Social History   Tobacco Use  . Smoking status: Current Every Day Smoker    Packs/day: 1.00    Years: 30.00    Pack years: 30.00    Types: Cigarettes  . Smokeless tobacco: Never Used  Substance Use Topics  . Alcohol use: No    Alcohol/week: 58.0 standard drinks    Types: 58 Cans of beer per week    Comment: 1 pint liquer per week. Pt reports he has stopped drinking like this in August of 2017  . Drug use: No  Review of Systems  Constitutional: No fever/chills Eyes: No visual changes. ENT: No sore throat. Cardiovascular: Denies chest pain. Respiratory: As above Gastrointestinal: No abdominal pain.  No nausea, no vomiting.  No diarrhea.  No constipation. Genitourinary: Negative for dysuria. Musculoskeletal: Negative for back pain. Skin: Negative for rash. Neurological: Negative for headaches, focal weakness    ____________________________________________   PHYSICAL EXAM:  VITAL SIGNS: ED Triage Vitals  Enc Vitals Group     BP 08/20/18 0704 (!) 181/84     Pulse Rate 08/20/18 0704 94     Resp 08/20/18 0704 16     Temp --      Temp src --      SpO2 08/20/18 0704 97 %     Weight --      Height --      Head Circumference --      Peak Flow --      Pain Score 08/20/18 0705 0     Pain Loc --      Pain Edu? --      Excl. in [place]? --     Constitutional: Alert and oriented. Well appearing and in no acute distress. Eyes: Conjunctivae are normal.  Head: Atraumatic. Nose: No congestion/rhinnorhea. Mouth/Throat: Mucous membranes are moist.  Neck: No stridor.   Cardiovascular: Normal rate, regular rhythm. Grossly normal heart sounds.  Good peripheral circulation with equal and bilateral  radial pulses. Respiratory: Normal respiratory effort.  No retractions. Lungs CTAB. Gastrointestinal: Soft and nontender. No distention. Musculoskeletal: No lower extremity tenderness nor edema.  No joint effusions. Neurologic:  Normal speech and language. No gross focal neurologic deficits are appreciated. Skin:  Skin is warm, dry and intact. No rash noted. Psychiatric: Mood and affect are normal. Speech and behavior are normal.  ____________________________________________   LABS (all labs ordered are listed, but only abnormal results are displayed)  Labs Reviewed  CBC WITH DIFFERENTIAL/PLATELET - Abnormal; Notable for the following components:      Result Value   RDW 16.6 (*)    Platelets 145 (*)    All other components within normal limits  BASIC METABOLIC PANEL - Abnormal; Notable for the following components:   Glucose, Bld 103 (*)    BUN 5 (*)    Calcium 8.8 (*)    All other components within normal limits  TSH - Abnormal; Notable for the following components:   TSH 8.560 (*)    All other components within normal limits  BRAIN NATRIURETIC PEPTIDE  TROPONIN I  INFLUENZA PANEL BY PCR (TYPE A & B)  HEPATIC FUNCTION PANEL   ____________________________________________  EKG  ED ECG REPORT I, Doran Stabler, the attending physician, personally viewed and interpreted this ECG.   Date: 08/20/2018  EKG Time: 0713  Rate: 89  Rhythm: normal sinus rhythm with PVC x1  Axis: Normal  Intervals:none  ST&T Change: No ST segment elevation or depression.  No abnormal T wave inversion.  ____________________________________________  RADIOLOGY  No acute findings on the chest x-ray.  CT head without acute findings.  ____________________________________________   PROCEDURES  Procedure(s) performed:   Procedures  Critical Care performed:   ____________________________________________   INITIAL IMPRESSION / ASSESSMENT AND PLAN / ED COURSE  Pertinent labs & imaging  results that were available during my care of the patient were reviewed by me and considered in my medical decision making (see chart for details).  Differential includes, but is not limited to, viral syndrome, bronchitis including COPD exacerbation, pneumonia, reactive airway disease including  asthma, CHF including exacerbation with or without pulmonary/interstitial edema, pneumothorax, ACS, thoracic trauma, and pulmonary embolism. As part of my medical decision making, I reviewed the following data within the electronic MEDICAL RECORD NUMBER Notes from prior ED visits  ----------------------------------------- 7:45 AM on 08/20/2018 -----------------------------------------  Wife now the bedside.  Reporting that the patient drinks 4-5 large drinks per day.  ----------------------------------------- 8:49 AM on 08/20/2018 -----------------------------------------  Patient's wife now at the bedside and states that he had left-sided weakness this Saturday morning, 2 days ago.  However, the weakness has resolved.  Said that the ambulance was called yesterday as well but the patient refused transport.  She is concerned about stroke/TIA.  Neuro exam redone at this time patient without any focal findings.  ----------------------------------------- 10:26 AM on 08/20/2018 -----------------------------------------  Patient at this time remains without any neurologic deficits.  Reassuring CAT scan.  However, with approximate 25-minute episode this past Saturday morning where he had numbness to his left face as well as left upper extremity weakness.  Says that he is also been having intermittent left upper extremity as well as facial numbness since then although is not complaining of any of the symptoms currently.  Reassuring neuro exam at this time.  CT head without any acute finding.  Concern for repetitive symptoms.  Given aspirin.  Will admit for further work-up.  Signed out to Dr. Bridgett Larsson.  Patient and family  understanding of the diagnosis well treatment and willing to comply. ____________________________________________   FINAL CLINICAL IMPRESSION(S) / ED DIAGNOSES  TIA.  Shortness of breath.  Generalized weakness.  NEW MEDICATIONS STARTED DURING THIS VISIT:  New Prescriptions   No medications on file     Note:  This document was prepared using Dragon voice recognition software and may include unintentional dictation errors.     Orbie Pyo, MD 08/20/18 1027

## 2018-08-20 NOTE — Progress Notes (Signed)
Advanced Care Plan.  Purpose of Encounter: CODE STATUS Parties in Attendance: The patient, his wife and the me. Patient's Decisional Capacity: Yes. Medical Story: Russell Rivas  is a 63 y.o. male with a known history of alcohol abuse, anxiety, arthritis, COPD, depression, GERD, hypertension, hypothyroidism.   The patient is being admitted for TIA and accelerated hypertension.  I discussed with the patient about his current condition, prognosis and CODE STATUS.  The patient wants to be resuscitated and intubated if he has cardiopulmonary rest.  Plan:  Code Status: Full code. Time spent discussing advance care planning:17 minutes.

## 2018-08-21 ENCOUNTER — Observation Stay
Admit: 2018-08-21 | Discharge: 2018-08-21 | Disposition: A | Discharge status: Home / Self Care | Payer: Medicare Other | Attending: Internal Medicine | Admitting: Internal Medicine

## 2018-08-21 LAB — LIPID PANEL
Cholesterol: 230 mg/dL — ABNORMAL HIGH (ref 0–200)
HDL: 75 mg/dL (ref >40)
LDL Cholesterol: 136 mg/dL — ABNORMAL HIGH (ref 0–99)
Total CHOL/HDL Ratio: 3.1 RATIO
Triglycerides: 97 mg/dL (ref <150)
VLDL: 19 mg/dL (ref 0–40)

## 2018-08-21 LAB — HEMOGLOBIN A1C
Hgb A1c MFr Bld: 5.3 % (ref 4.8–5.6)
Mean Plasma Glucose: 105.41 mg/dL

## 2018-08-21 LAB — ECHOCARDIOGRAM COMPLETE

## 2018-08-21 LAB — HIV ANTIBODY (ROUTINE TESTING W REFLEX): HIV Screen 4th Generation wRfx: NONREACTIVE

## 2018-08-21 MED ORDER — ASPIRIN 81 MG PO CHEW: 81.0000 mg | CHEWABLE_TABLET | Freq: Every day | ORAL | 0 refills | Status: AC

## 2018-08-21 MED ORDER — LISINOPRIL 10 MG PO TABS: 10.0000 mg | ORAL_TABLET | Freq: Every day | ORAL | 0 refills | Status: DC

## 2018-08-21 MED ORDER — ATORVASTATIN CALCIUM 40 MG PO TABS: 40.0000 mg | ORAL_TABLET | Freq: Every day | ORAL | 0 refills | Status: DC

## 2018-08-21 NOTE — Progress Notes (Signed)
SLP Cancellation Note  Patient Details Name: Russell Rivas MRN: [id number] DOB: April 24, 1956   Cancelled treatment:       Reason Eval/Treat Not Completed: SLP screened, no needs identified, will sign off. Chart reviewed; NSG consulted. Pt was upright in bed, about to eat breakfast w/ OT present upon ST entry. Pt was speaking clearly and coherently in grammatically correct sentences, while explaining overactive thyroid levels to OT. Pt shared that he is not having any trouble w/ his speech/language output. ST to s/o at this time - NSG to consult if any changes in speech, language, or swallowing arise while admitted.    Emeline General, Graduate Student SLP 08/21/2018, 8:42 AM

## 2018-08-21 NOTE — Evaluation (Signed)
Physical Therapy Evaluation Patient Details Name: Russell Rivas MRN: [id number] DOB: 03/22/1956 Today's Date: 08/21/2018   History of Present Illness  From MD H&P: Pt is a 63 y.o. male with a known history of alcohol abuse, anxiety, arthritis, COPD, depression, GERD, hypertension, hypothyroidism.  The patient presents the ED with above chief complaints.  The patient complained of left arm weakness and numbness recently which resolved.  He also complained of generalized weakness, shortness of breath with activity and still has facial numbness tingling left side.  His blood pressure was elevated up to 200s/100s.  He denied any dysphagia, slurred speech or incontinence.  CAT scan of head is unremarkable.  He is given aspirin in the ED.  ED physician request admission for TIA work-up.    Clinical Impression  Pt presents with no deficits in strength, transfers, mobility, gait, balance, or activity tolerance.  No deficits noted to BUE or BLE coordination, sensation, or proprioception.  No deficits noted to visual field or visual reflexes. Will complete orders at this time but will reassess pt pending a change in status upon receipt of new PT orders.       Follow Up Recommendations No PT follow up    Equipment Recommendations  None recommended by PT    Recommendations for Other Services       Precautions / Restrictions Precautions Precautions: None Restrictions Weight Bearing Restrictions: No      Mobility  Bed Mobility Overal bed mobility: Independent                Transfers Overall transfer level: Independent               General transfer comment: Good eccentric control and stability  Ambulation/Gait Ambulation/Gait assistance: Independent Gait Distance (Feet): 200 Feet Assistive device: None   Gait velocity: WNL   General Gait Details: Pt steady during dynamic gait with head turns and start/stops  Stairs Stairs: Yes Stairs assistance: Modified independent  (Device/Increase time) Stair Management: One rail Right Number of Stairs: 9 General stair comments: Reciprocal pattern with good eccentric and concentric control  Wheelchair Mobility    Modified Rankin (Stroke Patients Only)       Balance Overall balance assessment: Independent(SLS time >10 seconds bilaterally, (-) Romberg sign)                                           Pertinent Vitals/Pain Pain Assessment: No/denies pain    Home Living Family/patient expects to be discharged to:: Private residence Living Arrangements: Spouse/significant other Available Help at Discharge: Family;Available 24 hours/day Type of Home: House Home Access: Stairs to enter Entrance Stairs-Rails: Right;Left;Can reach both Entrance Stairs-Number of Steps: 4 Home Layout: One level Home Equipment: Clinical cytogeneticist - 2 wheels;Walker - 4 wheels;Crutches;Wheelchair - manual;Bedside commode      Prior Function Level of Independence: Independent         Comments: Pt Ind with amb community distances without an AD, works out at a [employer], no fall history, Ind with ADLs     Hand Dominance   Dominant Hand: Right    Extremity/Trunk Assessment   Upper Extremity Assessment Upper Extremity Assessment: Overall WFL for tasks assessed;RUE deficits/detail;LUE deficits/detail RUE Deficits / Details: RUE strength 5/5 RUE Sensation: WNL RUE Coordination: WNL LUE Deficits / Details: LUE strength 5/5 LUE Sensation: WNL LUE Coordination: WNL    Lower Extremity Assessment  Lower Extremity Assessment: Overall WFL for tasks assessed;RLE deficits/detail;LLE deficits/detail RLE Deficits / Details: Heel to shin WNL  RLE Sensation: WNL RLE Coordination: WNL LLE Deficits / Details: Heel to shin WNL  LLE Sensation: WNL LLE Coordination: WNL    Cervical / Trunk Assessment Cervical / Trunk Assessment: Normal  Communication   Communication: No difficulties  Cognition Arousal/Alertness:  Awake/alert Behavior During Therapy: WFL for tasks assessed/performed Overall Cognitive Status: Within Functional Limits for tasks assessed                                        General Comments      Exercises     Assessment/Plan    PT Assessment Patent does not need any further PT services  PT Problem List         PT Treatment Interventions      PT Goals (Current goals can be found in the Care Plan section)  Acute Rehab PT Goals PT Goal Formulation: All assessment and education complete, DC therapy    Frequency     Barriers to discharge        Co-evaluation               AM-PAC PT "6 Clicks" Mobility  Outcome Measure Help needed turning from your back to your side while in a flat bed without using bedrails?: None Help needed moving from lying on your back to sitting on the side of a flat bed without using bedrails?: None Help needed moving to and from a bed to a chair (including a wheelchair)?: None Help needed standing up from a chair using your arms (e.g., wheelchair or bedside chair)?: None Help needed to walk in hospital room?: None Help needed climbing 3-5 steps with a railing? : None 6 Click Score: 24    End of Session Equipment Utilized During Treatment: Gait belt Activity Tolerance: Patient tolerated treatment well Patient left: in bed;with call bell/phone within reach Nurse Communication: Mobility status PT Visit Diagnosis: Muscle weakness (generalized) (M62.81)    Time: 5681-2751 PT Time Calculation (min) (ACUTE ONLY): 18 min   Charges:   PT Evaluation $PT Eval Low Complexity: 1 Low          D. Scott Jakki Doughty PT, DPT 08/21/18, 11:19 AM

## 2018-08-21 NOTE — Progress Notes (Signed)
*  PRELIMINARY RESULTS* Echocardiogram 2D Echocardiogram has been performed.  Russell Rivas 08/21/2018, 8:42 AM

## 2018-08-21 NOTE — Discharge Summary (Signed)
[name]    MR#:  [id number]  DATE OF BIRTH:  October 01, 1955  DATE OF ADMISSION:  08/20/2018 ADMITTING PHYSICIAN: Demetrios Loll, MD  DATE OF DISCHARGE: 08/21/2018  PRIMARY CARE PHYSICIAN: Baxter Hire, MD   ADMISSION DIAGNOSIS:  Shortness of breath [R06.02] TIA (transient ischemic attack) [G45.9] Generalized weakness [R53.1]  DISCHARGE DIAGNOSIS:  Active Problems:   TIA (transient ischemic attack) COPD Uncontrolled hypertension Hypothyroidism Arthritis Hyperlipidemia  SECONDARY DIAGNOSIS:   Past Medical History:  Diagnosis Date  . Alcohol abuse    pt reports drinking at least one pint everyday.   . Anxiety   . Arthritis   . COPD (chronic obstructive pulmonary disease) (HCC)    NO inhalers  . Depression   . GERD (gastroesophageal reflux disease)   . Hypertension   . Hypothyroidism    does not take medication at this time.  has in the past  . Polio    as a child, caused poblems in right knee.     ADMITTING HISTORY Russell Rivas  is a 63 y.o. male with a known history of alcohol abuse, anxiety, arthritis, COPD, depression, GERD, hypertension, hypothyroidism.  The patient presents the ED with above chief complaints.The patient complains of left arm weakness and numbness for about 20 to 25 minutes 3 days ago, which resolved.  He also complains of generalized weakness, shortness of breath with activity and still has facial numbness tingling left side today.  His blood pressure is elevated up to 200s/100s.  He denies any dysphagia, slurred speech or incontinence.  CAT scan of head is unremarkable.  He is given aspirin in the ED.  ED physician request admission for TIA work-up.  HOSPITAL COURSE:  Patient was admitted to medical floor.  Blood pressure was mostly controlled with multiple medications.  Was worked up with CT head, MRI brain and MRA brain.  No acute abnormality noted on the scans.  Patient was also  worked up with echocardiogram.  Numbness in the face has resolved.  Blood pressure was better controlled at the time of discharge.  Patient will be discharged home on cholesterol medication, hypertensive medications advised to follow-up with primary care physician.  Tobacco cessation was also counseled the time of discharge.  CONSULTS OBTAINED:    DRUG ALLERGIES:   Allergies  Allergen Reactions  . Hydrocodone Itching  . Penicillins Rash    DISCHARGE MEDICATIONS:   Allergies as of 08/21/2018      Reactions   Hydrocodone Itching   Penicillins Rash      Medication List    STOP taking these medications   chlordiazePOXIDE 10 MG capsule Commonly known as:  LIBRIUM   cyclobenzaprine 5 MG tablet Commonly known as:  FLEXERIL   dicyclomine 10 MG capsule Commonly known as:  BENTYL   ibuprofen 800 MG tablet Commonly known as:  ADVIL,MOTRIN     TAKE these medications   aspirin 81 MG chewable tablet Chew 1 tablet (81 mg total) by mouth daily for 30 days. Start taking on:  August 22, 2018   atorvastatin 40 MG tablet Commonly known as:  LIPITOR Take 1 tablet (40 mg total) by mouth daily at 6 PM for 30 days.   diphenoxylate-atropine 2.5-0.025 MG tablet Commonly known as:  LOMOTIL Take 1 tablet by mouth 4 (four) times daily as needed for diarrhea or loose stools.   levothyroxine 25 MCG tablet Commonly known as:  SYNTHROID, LEVOTHROID Take 25 mcg by  mouth daily before breakfast.   lisinopril 10 MG tablet Commonly known as:  PRINIVIL,ZESTRIL Take 1 tablet (10 mg total) by mouth daily for 30 days.   LORazepam 0.5 MG tablet Commonly known as:  ATIVAN Take 0.5 mg by mouth every 8 (eight) hours.   magnesium oxide 400 MG tablet Commonly known as:  MAG-OX Take 400 mg by mouth daily.   metoprolol succinate 25 MG 24 hr tablet Commonly known as:  TOPROL-XL Take 25 mg by mouth every morning.   multivitamin with minerals tablet Take 1 tablet by mouth every morning.        Today  Patient seen today No chest pain No palpitations Numbness of face resolved  VITAL SIGNS:  Blood pressure (!) 146/89, pulse 89, temperature 98.2 F (36.8 C), temperature source Oral, resp. rate 16, SpO2 95 %.  I/O:    Intake/Output Summary (Last 24 hours) at 08/21/2018 1253 Last data filed at 08/20/2018 1853 Gross per 24 hour  Intake 240 ml  Output -  Net 240 ml    PHYSICAL EXAMINATION:  Physical Exam  GENERAL:  63 y.o.-year-old patient lying in the bed with no acute distress.  LUNGS: Normal breath sounds bilaterally, no wheezing, rales,rhonchi or crepitation. No use of accessory muscles of respiration.  CARDIOVASCULAR: S1, S2 normal. No murmurs, rubs, or gallops.  ABDOMEN: Soft, non-tender, non-distended. Bowel sounds present. No organomegaly or mass.  NEUROLOGIC: Moves all 4 extremities. PSYCHIATRIC: The patient is alert and oriented x 3.  SKIN: No obvious rash, lesion, or ulcer.   DATA REVIEW:   CBC Recent Labs  Lab 08/20/18 0716  WBC 6.0  HGB 16.2  HCT 49.0  PLT 145*    Chemistries  Recent Labs  Lab 08/20/18 0716  NA 138  K 3.5  CL 104  CO2 23  GLUCOSE 103*  BUN 5*  CREATININE 0.74  CALCIUM 8.8*  AST 88*  ALT 42  ALKPHOS 42  BILITOT 1.6*    Cardiac Enzymes Recent Labs  Lab 08/20/18 0716  TROPONINI <0.03    Microbiology Results  Results for orders placed or performed during the hospital encounter of 11/26/14  Anaerobic culture     Status: None   Collection Time: 11/26/14  8:05 AM  Result Value Ref Range Status   Specimen Description WOUND  Final   Special Requests NONE  Final   Culture NO ANAEROBES ISOLATED  Final   Report Status 11/30/2014 FINAL  Final  Gram stain     Status: None   Collection Time: 11/26/14  8:05 AM  Result Value Ref Range Status   Specimen Description WOUND  Final   Special Requests NONE  Final   Report Status 12/26/2014 FINAL  Final  Wound culture     Status: None   Collection Time: 11/26/14  8:05  AM  Result Value Ref Range Status   Specimen Description WOUND  Final   Special Requests NONE  Final   Gram Stain RARE WBC SEEN NO ORGANISMS SEEN   Final   Culture NO GROWTH 4 DAYS  Final   Report Status 11/30/2014 FINAL  Final    RADIOLOGY:  Dg Chest 2 View  Result Date: 08/20/2018 CLINICAL DATA:  Hypertension since yesterday.  Shortness of breath. EXAM: CHEST - 2 VIEW COMPARISON:  07/26/2017. FINDINGS: Trachea is midline. Heart size normal. Biapical pleural thickening. Lungs are otherwise clear. No pleural fluid. Degenerative changes are seen in the spine. IMPRESSION: No acute findings. Electronically Signed   By: Lorin Picket  M.D.   On: 08/20/2018 07:44   Ct Head Wo Contrast  Result Date: 08/20/2018 CLINICAL DATA:  High blood pressure since yesterday. Left-sided numbness and weakness. EXAM: CT HEAD WITHOUT CONTRAST TECHNIQUE: Contiguous axial images were obtained from the base of the skull through the vertex without intravenous contrast. COMPARISON:  None. FINDINGS: Brain: No evidence of acute infarction, hemorrhage, hydrocephalus, extra-axial collection or mass lesion/mass effect. Vascular: No hyperdense vessel or unexpected calcification. Skull: Normal. Negative for fracture or focal lesion. Sinuses/Orbits: No acute finding. Other: None. IMPRESSION: No acute intracranial abnormalities identified. Electronically Signed   By: Dorise Bullion III M.D   On: 08/20/2018 09:41   Mr Brain Wo Contrast  Result Date: 08/20/2018 CLINICAL DATA:  Left-sided facial numbness beginning today. Left arm numbness in weakness 3 days ago. EXAM: MRI HEAD WITHOUT CONTRAST MRA HEAD WITHOUT CONTRAST TECHNIQUE: Multiplanar, multiecho pulse sequences of the brain and surrounding structures were obtained without intravenous contrast. Angiographic images of the head were obtained using MRA technique without contrast. COMPARISON:  Head CT same day FINDINGS: MRI HEAD FINDINGS Brain: Diffusion imaging does not show any  acute or subacute infarction. The brainstem and cerebellum are normal. Cerebral hemispheres are normal. No mass, hemorrhage, hydrocephalus or extra-axial collection. Vascular: Major vessels at the base of the brain show flow. Skull and upper cervical spine: Negative Sinuses/Orbits: Clear/normal Other: None MRA HEAD FINDINGS Both internal carotid arteries are widely patent into the brain. No siphon stenosis. The anterior and middle cerebral vessels are patent without proximal stenosis, aneurysm or vascular malformation. Both vertebral arteries are widely patent to the basilar. Proximal basilar fenestration, incidental. No basilar stenosis. Posterior circulation branch vessels appear normal. IMPRESSION: Normal appearance of the brain. Normal intracranial MR angiography of the large and medium size vessels. Electronically Signed   By: Nelson Chimes M.D.   On: 08/20/2018 14:24   US Carotid Bilateral (at [facility] Only)  Result Date: 08/20/2018 CLINICAL DATA:  Left-sided numbness and weakness. History of hypertension EXAM: BILATERAL CAROTID DUPLEX ULTRASOUND TECHNIQUE: Pearline Cables scale imaging, color Doppler and duplex ultrasound were performed of bilateral carotid and vertebral arteries in the neck. COMPARISON:  None. FINDINGS: Criteria: Quantification of carotid stenosis is based on velocity parameters that correlate the residual internal carotid diameter with NASCET-based stenosis levels, using the diameter of the distal internal carotid lumen as the denominator for stenosis measurement. The following velocity measurements were obtained: RIGHT ICA:  79/26 cm/sec CCA:  371/69 cm/sec SYSTOLIC ICA/CCA RATIO:  0.8 ECA:  124 cm/sec LEFT ICA:  121/26 cm/sec CCA:  67/89 cm/sec SYSTOLIC ICA/CCA RATIO:  1.5 ECA:  118 cm/sec RIGHT CAROTID ARTERY: There is a mild amount of partially calcified plaque at the level of the carotid bulb. No evidence of right ICA plaque or stenosis. RIGHT VERTEBRAL ARTERY: Antegrade flow with normal  waveform and velocity. LEFT CAROTID ARTERY: There is a moderate amount of predominately calcified plaque at the level of the left carotid bulb and ICA origin. Estimated left ICA stenosis is less than 50%. LEFT VERTEBRAL ARTERY: Antegrade flow with normal waveform and velocity. IMPRESSION: 1. Moderate calcified plaque at the level of the left carotid bulb and proximal left ICA with estimated left ICA stenosis of less than 50%. 2. Mild amount of plaque at the level of the right carotid bulb. No evidence of right ICA plaque or stenosis. Electronically Signed   By: Aletta Edouard M.D.   On: 08/20/2018 15:50   Mr Jodene Nam Head/brain FY Cm  Result Date: 08/20/2018  CLINICAL DATA:  Left-sided facial numbness beginning today. Left arm numbness in weakness 3 days ago. EXAM: MRI HEAD WITHOUT CONTRAST MRA HEAD WITHOUT CONTRAST TECHNIQUE: Multiplanar, multiecho pulse sequences of the brain and surrounding structures were obtained without intravenous contrast. Angiographic images of the head were obtained using MRA technique without contrast. COMPARISON:  Head CT same day FINDINGS: MRI HEAD FINDINGS Brain: Diffusion imaging does not show any acute or subacute infarction. The brainstem and cerebellum are normal. Cerebral hemispheres are normal. No mass, hemorrhage, hydrocephalus or extra-axial collection. Vascular: Major vessels at the base of the brain show flow. Skull and upper cervical spine: Negative Sinuses/Orbits: Clear/normal Other: None MRA HEAD FINDINGS Both internal carotid arteries are widely patent into the brain. No siphon stenosis. The anterior and middle cerebral vessels are patent without proximal stenosis, aneurysm or vascular malformation. Both vertebral arteries are widely patent to the basilar. Proximal basilar fenestration, incidental. No basilar stenosis. Posterior circulation branch vessels appear normal. IMPRESSION: Normal appearance of the brain. Normal intracranial MR angiography of the large and medium  size vessels. Electronically Signed   By: Nelson Chimes M.D.   On: 08/20/2018 14:24    Follow up with PCP in 1 week.  Management plans discussed with the patient, family and they are in agreement.  CODE STATUS: Full code    Code Status Orders  (From admission, onward)         Start     Ordered   08/20/18 1122  Full code  Continuous     08/20/18 1121        Code Status History    Date Active Date Inactive Code Status Order ID Comments User Context   10/02/2015 0940 10/02/2015 1347 Full Code [id number]  Dionisio David, MD Inpatient      TOTAL TIME TAKING CARE OF THIS PATIENT ON DAY OF DISCHARGE: more than 34 minutes.   Saundra Shelling M.D on 08/21/2018 at 12:53 PM  Between 7am to 6pm - Pager - [phone]  After 6pm go to [email or web address] - password [email or web address] [facility]  Office  [phone]  CC: Primary care physician; Baxter Hire, MD  Note: This dictation was prepared with Dragon dictation along with smaller phrase technology. Any transcriptional errors that result from this process are unintentional.

## 2018-08-21 NOTE — Progress Notes (Signed)
OT Cancellation Note  Patient Details Name: Russell Rivas MRN: [id number] DOB: 28-Aug-1955   Cancelled Treatment:    Reason Eval/Treat Not Completed: Patient at procedure or test/ unavailable. Consult received, chart reviewed. Pt having echo done. Will re-attempt OT evaluation at later time as pt is available and medically appropriate.  Jeni Salles, MPH, MS, OTR/L ascom [phone] 08/21/18, 8:34 AM

## 2018-08-21 NOTE — Progress Notes (Signed)
OT Cancellation Note  Patient Details Name: Russell Rivas MRN: [id number] DOB: Oct 24, 1955   Cancelled Treatment:    Reason Eval/Treat Not Completed: OT screened, no needs identified, will sign off. On 2nd attempt, pt ambulating in room independently. Once seated able to manipulate utensils and prepare his breakfast tray independently, without difficulty. Pt denies sensory deficits. Strength WFL 5/5 bilaterally UE/LE. No coordination, visual, language, or cognitive deficits appreciated. Pt independent, no skilled OT needs identified. Will sign off. Please re-consult if additional needs arise this admission. Thank you for this consult.  Jeni Salles, MPH, MS, OTR/L ascom [phone] 08/21/18, 8:48 AM

## 2018-08-27 ENCOUNTER — Encounter: Payer: Self-pay | Admitting: Emergency Medicine

## 2018-08-27 ENCOUNTER — Emergency Department
Admission: EM | Admit: 2018-08-27 | Discharge: 2018-08-27 | Disposition: A | Discharge status: Home / Self Care | Payer: Medicare Other | Attending: Emergency Medicine | Admitting: Emergency Medicine

## 2018-08-27 ENCOUNTER — Emergency Department: Payer: Medicare Other

## 2018-08-27 DIAGNOSIS — I10 Essential (primary) hypertension: Secondary | ICD-10-CM | POA: Diagnosis not present

## 2018-08-27 DIAGNOSIS — F17210 Nicotine dependence, cigarettes, uncomplicated: Secondary | ICD-10-CM | POA: Diagnosis not present

## 2018-08-27 DIAGNOSIS — Z79899 Other long term (current) drug therapy: Secondary | ICD-10-CM | POA: Insufficient documentation

## 2018-08-27 DIAGNOSIS — R202 Paresthesia of skin: Secondary | ICD-10-CM | POA: Insufficient documentation

## 2018-08-27 DIAGNOSIS — Z7982 Long term (current) use of aspirin: Secondary | ICD-10-CM | POA: Insufficient documentation

## 2018-08-27 DIAGNOSIS — J449 Chronic obstructive pulmonary disease, unspecified: Secondary | ICD-10-CM | POA: Diagnosis not present

## 2018-08-27 DIAGNOSIS — F10920 Alcohol use, unspecified with intoxication, uncomplicated: Secondary | ICD-10-CM | POA: Insufficient documentation

## 2018-08-27 LAB — BASIC METABOLIC PANEL
Anion gap: 9 (ref 5–15)
BUN: 7 mg/dL — ABNORMAL LOW (ref 8–23)
CO2: 26 mmol/L (ref 22–32)
Calcium: 8.7 mg/dL — ABNORMAL LOW (ref 8.9–10.3)
Chloride: 105 mmol/L (ref 98–111)
Creatinine, Ser: 0.71 mg/dL (ref 0.61–1.24)
GFR calc Af Amer: >60 mL/min (ref >60)
GFR calc non Af Amer: >60 mL/min (ref >60)
Glucose, Bld: 97 mg/dL (ref 70–99)
Potassium: 3.6 mmol/L (ref 3.5–5.1)
Sodium: 140 mmol/L (ref 135–145)

## 2018-08-27 LAB — CBC WITH DIFFERENTIAL/PLATELET
Abs Immature Granulocytes: 0.02 10*3/uL (ref 0.00–0.07)
Basophils Absolute: 0.1 10*3/uL (ref 0.0–0.1)
Basophils Relative: 1 %
Eosinophils Absolute: 0.2 10*3/uL (ref 0.0–0.5)
Eosinophils Relative: 3 %
HCT: 45.6 % (ref 39.0–52.0)
Hemoglobin: 15.7 g/dL (ref 13.0–17.0)
Immature Granulocytes: 0 %
Lymphocytes Relative: 49 %
Lymphs Abs: 2.8 10*3/uL (ref 0.7–4.0)
MCH: 31.6 pg (ref 26.0–34.0)
MCHC: 34.4 g/dL (ref 30.0–36.0)
MCV: 91.8 fL (ref 80.0–100.0)
Monocytes Absolute: 0.6 10*3/uL (ref 0.1–1.0)
Monocytes Relative: 11 %
Neutro Abs: 2 10*3/uL (ref 1.7–7.7)
Neutrophils Relative %: 36 %
Platelets: 223 10*3/uL (ref 150–400)
RBC: 4.97 MIL/uL (ref 4.22–5.81)
RDW: 16.2 % — ABNORMAL HIGH (ref 11.5–15.5)
WBC: 5.6 10*3/uL (ref 4.0–10.5)
nRBC: 0 % (ref 0.0–0.2)

## 2018-08-27 LAB — ETHANOL: Alcohol, Ethyl (B): 236 mg/dL — ABNORMAL HIGH (ref <10)

## 2018-08-27 LAB — TROPONIN I: Troponin I: <0.03 ng/mL (ref <0.03)

## 2018-08-27 MED ORDER — THIAMINE HCL 100 MG/ML IJ SOLN
Freq: Once | INTRAVENOUS | Status: AC
  Administered 2018-08-27: 04:00:00 via INTRAVENOUS
  Filled 2018-08-27: qty 1000

## 2018-08-27 MED ORDER — LORAZEPAM 2 MG/ML IJ SOLN
0.5000 mg | Freq: Once | INTRAMUSCULAR | Status: AC
  Administered 2018-08-27: 0.5 mg via INTRAVENOUS
  Filled 2018-08-27: qty 1

## 2018-08-27 NOTE — Discharge Instructions (Signed)
Drink alcohol in moderation.  Be sure to take your medicines as directed by your doctor.  Return to the ER for worsening symptoms, persistent vomiting, lethargy or other concerns.

## 2018-08-27 NOTE — ED Triage Notes (Signed)
Patient states that he has a TIA on Monday and was discharged on Tuesday. Patient states that today he developed left arm face and arm numbness that started about 09:00 this am.

## 2018-08-27 NOTE — ED Provider Notes (Signed)
[facility] Emergency Department Provider Note   ____________________________________________   First MD Initiated Contact with Patient 08/27/18 [phone]     (approximate)  I have reviewed the triage vital signs and the nursing notes.   HISTORY  Chief Complaint Numbness    HPI Russell Rivas is a 63 y.o. male who presents to the ED from home with a chief complaint of left facial numbness.   Patient was admitted 2/10 to 2/11 for same symptoms, had negative CT head and negative MRI of the brain, diagnosed with TIA and placed on baby aspirin.  Reports return of left facial numbness since 9 AM.  Denies associated headache, vision changes, neck pain, chest pain, shortness of breath, abdominal pain, nausea, vomiting or extremity weakness.  Denies recent travel or trauma.   Past Medical History:  Diagnosis Date  . Alcohol abuse    pt reports drinking at least one pint everyday.   . Anxiety   . Arthritis   . COPD (chronic obstructive pulmonary disease) (HCC)    NO inhalers  . Depression   . GERD (gastroesophageal reflux disease)   . Hypertension   . Hypothyroidism    does not take medication at this time.  has in the past  . Polio    as a child, caused poblems in right knee.    Patient Active Problem List   Diagnosis Date Noted  . TIA (transient ischemic attack) 08/20/2018  . Blood in stool   . Rectal polyp   . First degree hemorrhoids   . Alcohol dependence ([place]) 06/17/2015  . Increased MCV 06/17/2015  . Current tear of meniscus 12/05/2014  . 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 Gonalgia 09/17/2014  . Lumbar radiculopathy 04/15/2013  . Complete rotator cuff rupture of left shoulder 09/04/2012    Past Surgical History:  Procedure Laterality Date  . APPENDECTOMY    . CARDIAC CATHETERIZATION Left 10/02/2015   Procedure: Left Heart Cath and Coronary Angiography;  Surgeon: Dionisio David, MD;  Location: [facility];  Service:  Cardiovascular;  Laterality: Left;  . COLONOSCOPY WITH PROPOFOL N/A 12/08/2015   Procedure: COLONOSCOPY WITH PROPOFOL;  Surgeon: Lucilla Lame, MD;  Location: [facility] END[facility];  Service: Endoscopy;  Laterality: N/A;  . KNEE ARTHROSCOPY Right 09/02/2015   Procedure: ARTHROSCOPY KNEE, debridement, microfracture;  Surgeon: Leanor Kail, MD;  Location: [facility] ORS;  Service: Orthopedics;  Laterality: Right;  . KNEE ARTHROSCOPY WITH MEDIAL MENISECTOMY Right 11/26/2014   Procedure: KNEE ARTHROSCOPY WITH MEDIAL MENISECTOMY;  Surgeon: Leanor Kail, MD;  Location: [facility] ORS;  Service: Orthopedics;  Laterality: Right;  . KNEE LIGAMENT RECONSTRUCTION Right 1959   pt did not have ligaments, ligaments were made and implanted.  Marland Kitchen KNEE SURGERY Left 1969   growth bone removed.  Marland Kitchen SHOULDER ARTHROSCOPY Bilateral    one in January and one in June    Prior to Admission medications   Medication Sig Start Date End Date Taking? Authorizing Provider  aspirin 81 MG chewable tablet Chew 1 tablet (81 mg total) by mouth daily for 30 days. 08/22/18 09/21/18 Yes Pyreddy, Reatha Harps, MD  atorvastatin (LIPITOR) 40 MG tablet Take 1 tablet (40 mg total) by mouth daily at 6 PM for 30 days. 08/21/18 09/20/18 Yes Pyreddy, Reatha Harps, MD  levothyroxine (SYNTHROID, LEVOTHROID) 25 MCG tablet Take 25 mcg by mouth daily before breakfast.   Yes [provider]  lisinopril (PRINIVIL,ZESTRIL) 10 MG tablet Take 1 tablet (10 mg total) by mouth daily for 30 days. 08/21/18  09/20/18 Yes Pyreddy, Reatha Harps, MD  LORazepam (ATIVAN) 0.5 MG tablet Take 0.5 mg by mouth every 8 (eight) hours.   Yes [provider]  magnesium oxide (MAG-OX) 400 MG tablet Take 400 mg by mouth daily.   Yes [provider]  metoprolol succinate (TOPROL-XL) 25 MG 24 hr tablet Take 25 mg by mouth every morning.   Yes [provider]  Multiple Vitamins-Minerals (MULTIVITAMIN WITH MINERALS) tablet Take 1 tablet by mouth every morning.    Yes [provider]  diphenoxylate-atropine (LOMOTIL) 2.5-0.025 MG tablet Take 1 tablet by mouth 4 (four) times daily as needed for diarrhea or loose stools. Patient not taking: Reported on 08/20/2018 02/01/16   Lucilla Lame, MD    Allergies Hydrocodone and Penicillins  Family History  Problem Relation Age of Onset  . COPD Mother   . Heart disease Father     Social History Social History   Tobacco Use  . Smoking status: Current Every Day Smoker    Packs/day: 1.00    Years: 30.00    Pack years: 30.00    Types: Cigarettes  . Smokeless tobacco: Never Used  Substance Use Topics  . Alcohol use: No    Alcohol/week: 58.0 standard drinks    Types: 58 Cans of beer per week    Comment: 1 pint liquer per week. Pt reports he has stopped drinking like this in August of 2017  . Drug use: No    Review of Systems  Constitutional: No fever/chills Eyes: No visual changes. ENT: No sore throat. Cardiovascular: Denies chest pain. Respiratory: Denies shortness of breath. Gastrointestinal: No abdominal pain.  No nausea, no vomiting.  No diarrhea.  No constipation. Genitourinary: Negative for dysuria. Musculoskeletal: Negative for back pain. Skin: Negative for rash. Neurological: Negative for headaches, focal weakness.  Positive for left facial numbness.   ____________________________________________   PHYSICAL EXAM:  VITAL SIGNS: ED Triage Vitals  Enc Vitals Group     BP 08/27/18 0029 (!) 153/90     Pulse Rate 08/27/18 0029 89     Resp 08/27/18 0029 18     Temp 08/27/18 0029 97.6 F (36.4 C)     Temp Source 08/27/18 0029 Oral     SpO2 08/27/18 0029 96 %     Weight 08/27/18 0030 200 lb (90.7 kg)     Height 08/27/18 0030 5\' 6"  (1.676 m)     Head Circumference --      Peak Flow --      Pain Score 08/27/18 0030 7     Pain Loc --      Pain Edu? --      Excl. in [place]? --     Constitutional: Alert and oriented.  Intoxicated appearing and in no acute distress. Eyes: Conjunctivae are  normal. PERRL. EOMI. Head: Atraumatic. Nose: No congestion/rhinnorhea. Mouth/Throat: Mucous membranes are moist.  Oropharynx non-erythematous. Neck: No stridor.  No carotid bruits. Cardiovascular: Normal rate, regular rhythm. Grossly normal heart sounds.  Good peripheral circulation. Respiratory: Normal respiratory effort.  No retractions. Lungs CTAB. Gastrointestinal: Soft and nontender. No distention. No abdominal bruits. No CVA tenderness. Musculoskeletal: No lower extremity tenderness nor edema.  No joint effusions. Neurologic: Alert and oriented x3.  CN II-XII grossly intact.  Normal speech and language. No gross focal neurologic deficits are appreciated. No gait instability. Skin:  Skin is warm, dry and intact. No rash noted. Psychiatric: Mood and affect are normal. Speech and behavior are normal.  ____________________________________________   LABS (all labs  ordered are listed, but only abnormal results are displayed)  Labs Reviewed  CBC WITH DIFFERENTIAL/PLATELET - Abnormal; Notable for the following components:      Result Value   RDW 16.2 (*)    All other components within normal limits  BASIC METABOLIC PANEL - Abnormal; Notable for the following components:   BUN 7 (*)    Calcium 8.7 (*)    All other components within normal limits  ETHANOL - Abnormal; Notable for the following components:   Alcohol, Ethyl (B) 236 (*)    All other components within normal limits  TROPONIN I   ____________________________________________  EKG  ED ECG REPORT I, SUNG,JADE J, the attending physician, personally viewed and interpreted this ECG.   Date: 08/27/2018  EKG Time: 0040  Rate: 88  Rhythm: normal EKG, normal sinus rhythm  Axis: Normal  Intervals:none  ST&T Change: Nonspecific  ____________________________________________  RADIOLOGY  ED MD interpretation: No ICH, no acute cardiopulmonary process  Official radiology report(s): Ct Head Wo Contrast  Result Date:  08/27/2018 CLINICAL DATA:  63 year old male with recent TIA symptoms and new onset left face and upper extremity numbness. EXAM: CT HEAD WITHOUT CONTRAST TECHNIQUE: Contiguous axial images were obtained from the base of the skull through the vertex without intravenous contrast. COMPARISON:  Brain MRI and head CT 08/20/2018. FINDINGS: Brain: No midline shift, ventriculomegaly, mass effect, evidence of mass lesion, intracranial hemorrhage or evidence of cortically based acute infarction. Gray-white matter differentiation is within normal limits throughout the brain. No cortical encephalomalacia identified. Vascular: No suspicious intracranial vascular hyperdensity. Mild Calcified atherosclerosis at the skull base. Skull: Negative. Sinuses/Orbits: Visualized paranasal sinuses and mastoids are stable and well pneumatized. Other: Visualized orbits and scalp soft tissues are within normal limits. IMPRESSION: Stable and negative noncontrast CT appearance of the brain. Electronically Signed   By: Genevie Ann M.D.   On: 08/27/2018 01:59   Dg Chest Port 1 View  Result Date: 08/27/2018 CLINICAL DATA:  63 year old male with recent TIA, new left face and upper extremity numbness. EXAM: PORTABLE CHEST 1 VIEW COMPARISON:  Chest radiographs 08/20/2018 and earlier. FINDINGS: Portable AP upright view at 0038 hours. Lower lung volumes. Allowing for portable technique the lungs are clear. Normal cardiac size and mediastinal contours. Visualized tracheal air column is within normal limits. No acute osseous abnormality identified. IMPRESSION: Lower lung volumes.  No acute cardiopulmonary abnormality. Electronically Signed   By: Genevie Ann M.D.   On: 08/27/2018 01:06    ____________________________________________   PROCEDURES  Procedure(s) performed:   NIH Stroke Scale  Interval: Baseline Time: 0108 Person Administering Scale: SUNG,JADE J  Administer stroke scale items in the order listed. Record performance in each category  after each subscale exam. Do not go back and change scores. Follow directions provided for each exam technique. Scores should reflect what the patient does, not what the clinician thinks the patient can do. The clinician should record answers while administering the exam and work quickly. Except where indicated, the patient should not be coached (i.e., repeated requests to patient to make a special effort).   1a  Level of consciousness: 0=alert; keenly responsive  1b. LOC questions:  0=Performs both tasks correctly  1c. LOC commands: 0=Performs both tasks correctly  2.  Best Gaze: 0=normal  3.  Visual: 0=No visual loss  4. Facial Palsy: 0=Normal symmetric movement  5a.  Motor left arm: 0=No drift, limb holds 90 (or 45) degrees for full 10 seconds  5b.  Motor right arm: 0=No drift,  limb holds 90 (or 45) degrees for full 10 seconds  6a. motor left leg: 0=No drift, limb holds 90 (or 45) degrees for full 10 seconds  6b  Motor right leg:  0=No drift, limb holds 90 (or 45) degrees for full 10 seconds  7. Limb Ataxia: 0=Absent  8.  Sensory: 0=Normal; no sensory loss  9. Best Language:  0=No aphasia, normal  10. Dysarthria: 0=Normal  11. Extinction and Inattention: 0=No abnormality  12. Distal motor function: 0=Normal   Total:   0    Procedures  Critical Care performed: No  ____________________________________________   INITIAL IMPRESSION / ASSESSMENT AND PLAN / ED COURSE  As part of my medical decision making, I reviewed the following data within the [facility] notes reviewed and incorporated, Labs reviewed, EKG interpreted, Old chart reviewed, Radiograph reviewed and Notes from prior ED visits    63 year old male recently diagnosed with TIA who presents with left facial numbness.  Frontal diagnosis includes but is not limited to TIA, CVA, facial nerve paresthesia, alcohol intoxication, electrolyte abnormality, infection, etc.  Patient does appear intoxicated.   During the course of our conversation, patient breaks out in tears.  Denies active SI.  Will administer low-dose Ativan and obtain screening lab work and imaging studies.  Clinical Course as of Aug 27 549  Mon Aug 27, 2018  0310 All test results reviewed; banana bag ordered for elevated EtOH.  Anticipate discharge home after patient is sober and ambulatory.   [JS]  0551 Banana bag completed.  Patient is resting in no acute distress.  No focal neurological deficits.  Counseled him on moderate alcohol use.  He is to see his PCP tomorrow.  Strict return precautions given.  Patient verbalizes understanding agrees with plan of care.   [JS]    Clinical Course User Index [JS] Paulette Blanch, MD     ____________________________________________   FINAL CLINICAL IMPRESSION(S) / ED DIAGNOSES  Final diagnoses:  Paresthesia  Alcoholic intoxication without complication [facility])     ED Discharge Orders    None       Note:  This document was prepared using Dragon voice recognition software and may include unintentional dictation errors.   Paulette Blanch, MD 08/27/18 [phone]

## 2018-08-28 DIAGNOSIS — E039 Hypothyroidism, unspecified: Secondary | ICD-10-CM | POA: Insufficient documentation

## 2018-08-28 DIAGNOSIS — F32A Depression, unspecified: Secondary | ICD-10-CM | POA: Insufficient documentation

## 2018-11-30 DIAGNOSIS — Z9889 Other specified postprocedural states: Secondary | ICD-10-CM | POA: Insufficient documentation

## 2019-07-12 HISTORY — PX: JOINT REPLACEMENT: SHX530

## 2019-07-12 IMAGING — US US CAROTID DUPLEX BILAT
1 series · 13 of 24 positions shown · non-contrast
Comparison: None.

CLINICAL DATA: Left-sided numbness and weakness. History of
hypertension

EXAM:
BILATERAL CAROTID DUPLEX ULTRASOUND
TECHNIQUE: Gray scale imaging, color Doppler and duplex ultrasound were
performed of bilateral carotid and vertebral arteries in the neck.

[Series 1: us carotid duplex bilat · 0.06mm/px · 13 of 67 slices shown]
[im 1/67]
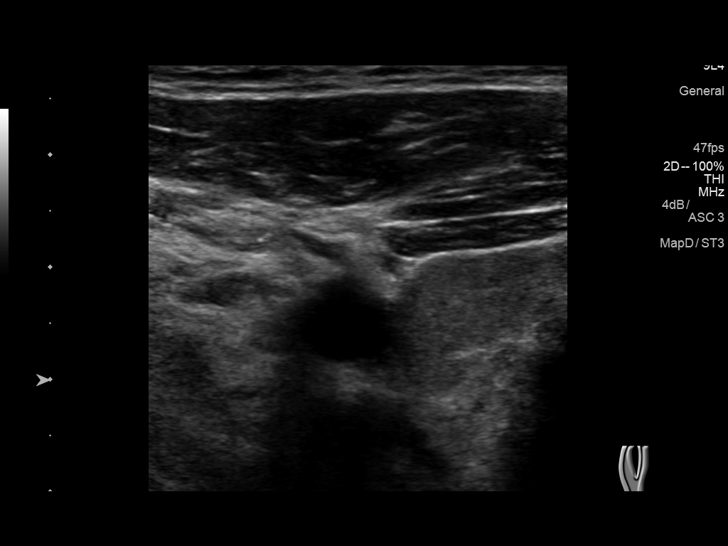
[im 6/67]
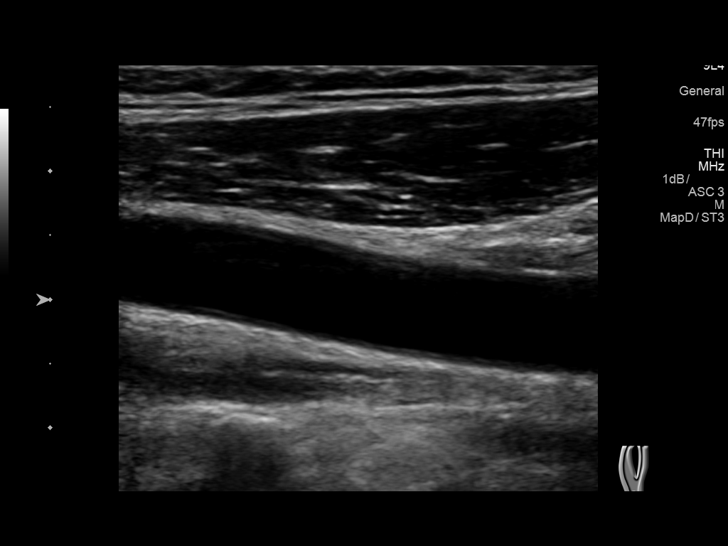
[im 12/67]
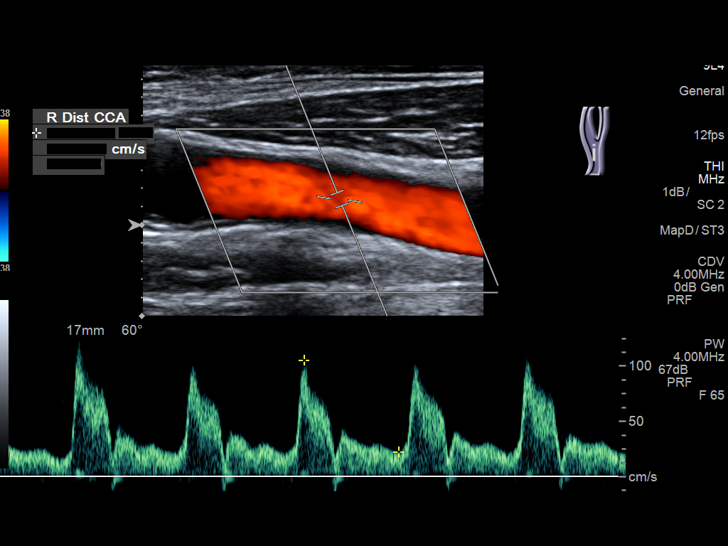
[im 18/67]
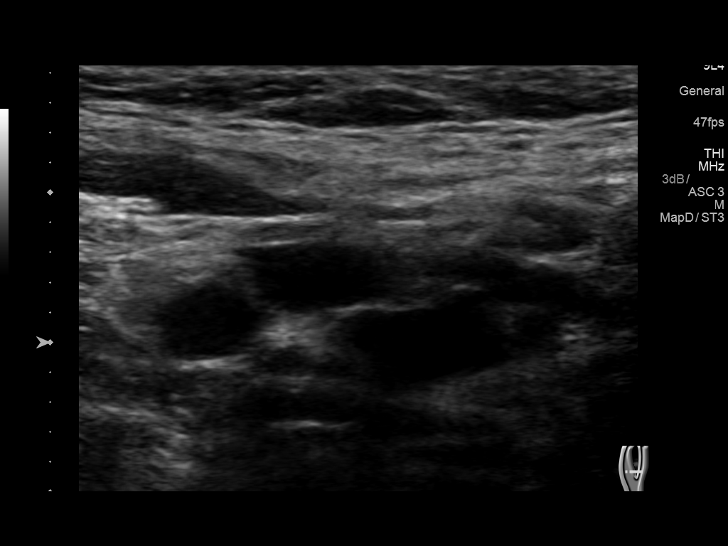
[im 23/67]
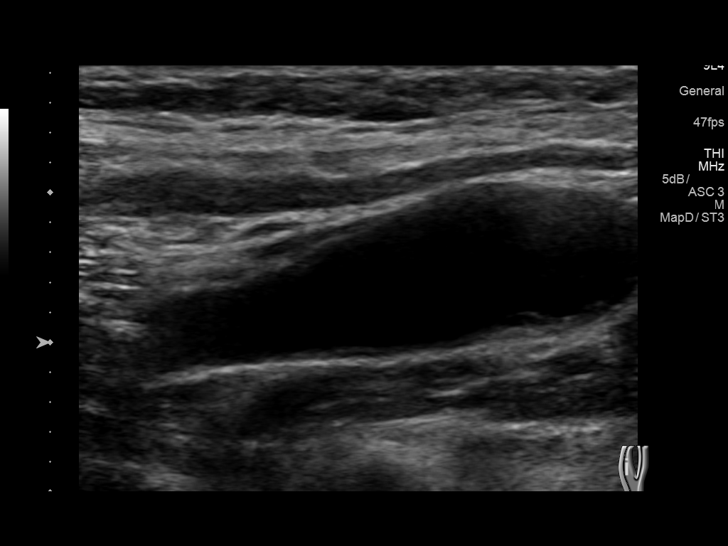
[im 29/67]
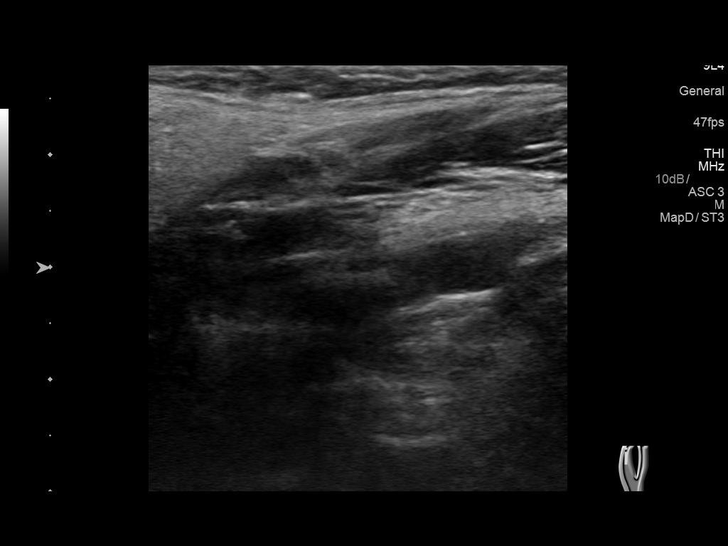
[im 35/67]
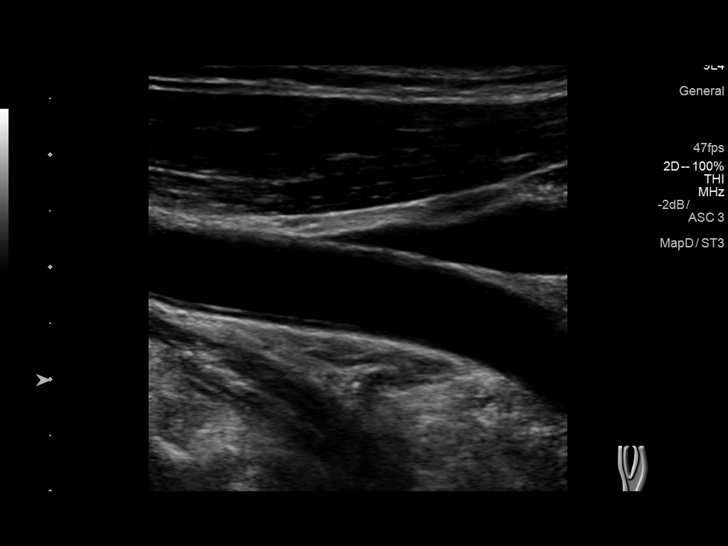
[im 38/67]
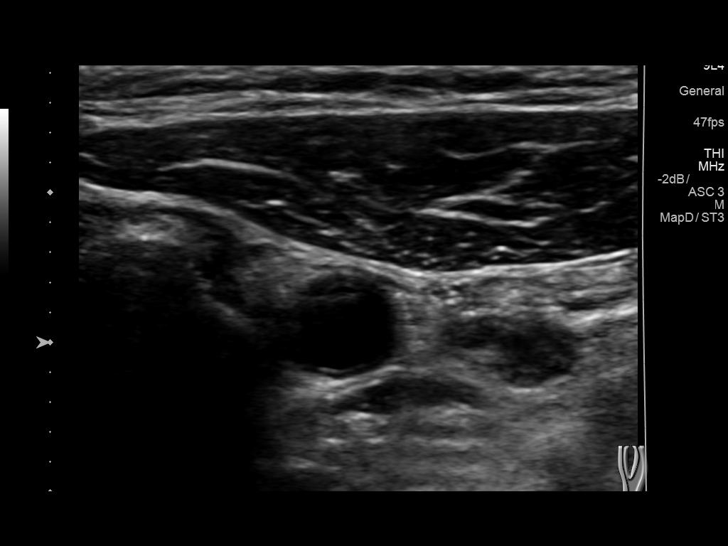
[im 44/67]
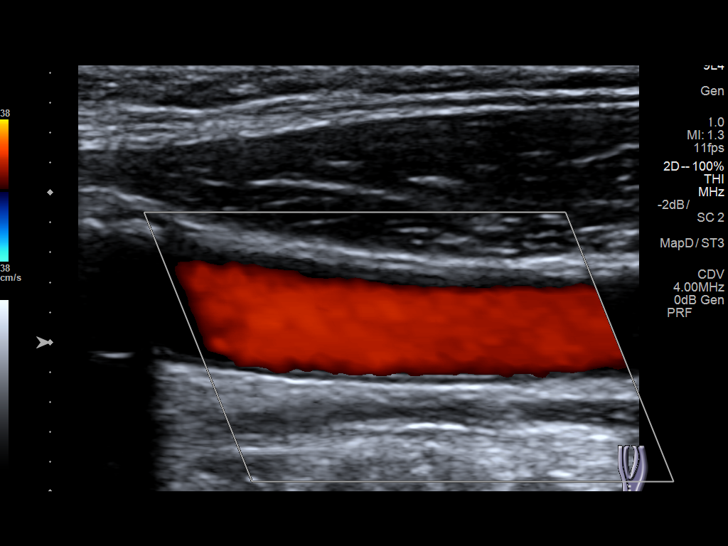
[im 49/67]
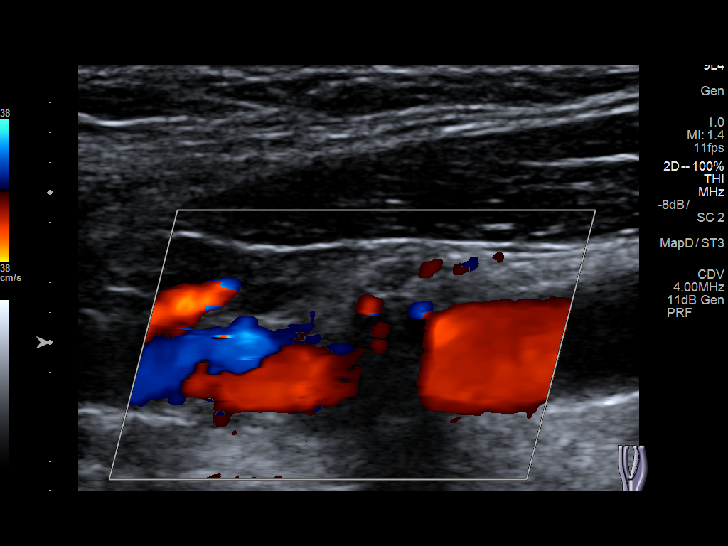
[im 55/67]
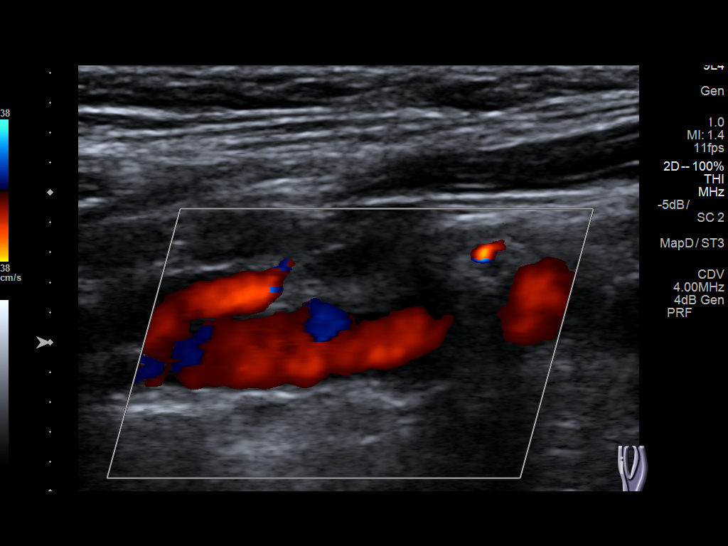
[im 61/67]
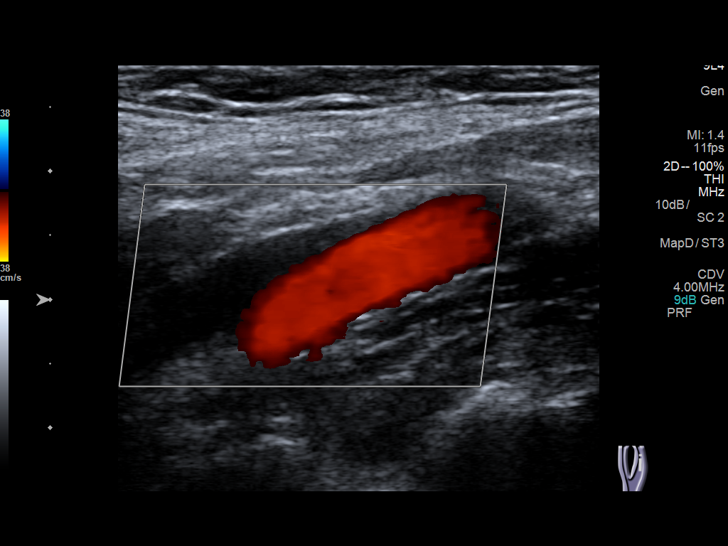
[im 67/67]
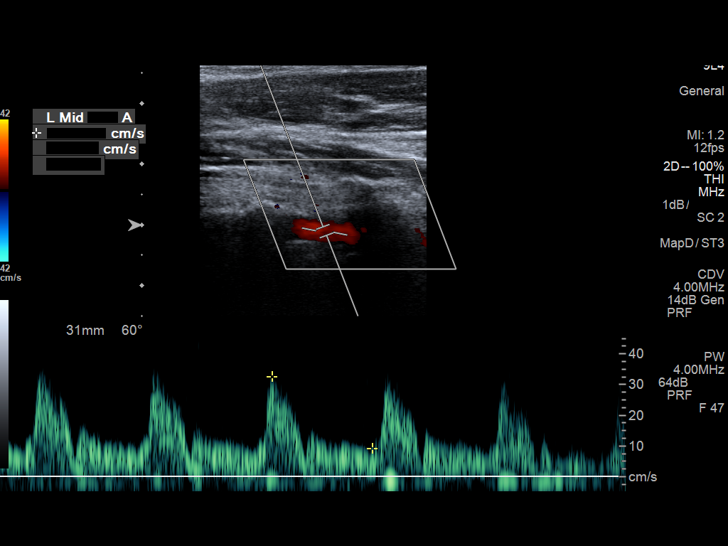

[13 of 24 positions shown; findings below may reference images not displayed]

FINDINGS: Criteria: Quantification of carotid stenosis is based on velocity
parameters that correlate the residual internal carotid diameter
with NASCET-based stenosis levels, using the diameter of the distal
internal carotid lumen as the denominator for stenosis measurement.

The following velocity measurements were obtained:

RIGHT

ICA:  79/26 cm/sec

CCA:  105/22 cm/sec

SYSTOLIC ICA/CCA RATIO:

ECA:  124 cm/sec

LEFT

ICA:  121/26 cm/sec

CCA:  81/17 cm/sec

SYSTOLIC ICA/CCA RATIO:

ECA:  118 cm/sec

RIGHT CAROTID ARTERY: There is a mild amount of partially calcified
plaque at the level of the carotid bulb. No evidence of right ICA
plaque or stenosis.

RIGHT VERTEBRAL ARTERY: Antegrade flow with normal waveform and
velocity.

LEFT CAROTID ARTERY: There is a moderate amount of predominately
calcified plaque at the level of the left carotid bulb and ICA
origin. Estimated left ICA stenosis is less than 50%.

LEFT VERTEBRAL ARTERY: Antegrade flow with normal waveform and
velocity.
IMPRESSION: 1. Moderate calcified plaque at the level of the left carotid bulb
and proximal left ICA with estimated left ICA stenosis of less than
50%.
2. Mild amount of plaque at the level of the right carotid bulb. No
evidence of right ICA plaque or stenosis.

## 2019-08-14 NOTE — Discharge Instructions (Signed)
Instructions after Total Knee Replacement   Russell Rivas, Jr., M.D.     Dept. of Orthopaedics & Sports Medicine  [facility]  [address]  [place], [place]  [address]  Phone: [phone]   Fax: [phone]    DIET: Drink plenty of non-alcoholic fluids. Resume your normal diet. Include foods high in fiber.  ACTIVITY:  You may use crutches or a walker with weight-bearing as tolerated, unless instructed otherwise. You may be weaned off of the walker or crutches by your Physical Therapist.  Do NOT place pillows under the knee. Anything placed under the knee could limit your ability to straighten the knee.   Continue doing gentle exercises. Exercising will reduce the pain and swelling, increase motion, and prevent muscle weakness.   Please continue to use the TED compression stockings for 6 weeks. You may remove the stockings at night, but should reapply them in the morning. Do not drive or operate any equipment until instructed.  WOUND CARE:  Continue to use the PolarCare or ice packs periodically to reduce pain and swelling. You may bathe or shower after the staples are removed at the first office visit following surgery.  MEDICATIONS: You may resume your regular medications. Please take the pain medication as prescribed on the medication. Do not take pain medication on an empty stomach. You have been given a prescription for a blood thinner (Lovenox or Coumadin). Please take the medication as instructed. (NOTE: After completing a 2 week course of Lovenox, take one Enteric-coated aspirin once a day. This along with elevation will help reduce the possibility of phlebitis in your operated leg.) Do not drive or drink alcoholic beverages when taking pain medications.  CALL THE OFFICE FOR: Temperature above 101 degrees Excessive bleeding or drainage on the dressing. Excessive swelling, coldness, or paleness of the toes. Persistent nausea and vomiting.  FOLLOW-UP:  You  should have an appointment to return to the office in 10-14 days after surgery. Arrangements have been made for continuation of Physical Therapy (either home therapy or outpatient therapy).   [facility] Department Directory         [email or web address]       [email or web address]          Cardiology  Appointments: [place] - [phone] [place] - [phone]  Endocrinology  Appointments: [place] - [phone] [place] - [phone]  Gastroenterology  Appointments: [place] - [phone] [place] - [phone]        General Surgery   Appointments: [place] - [phone]  Internal Medicine/Family Medicine  Appointments: [place] - [phone] [place] - [phone] [place] - [phone]  Metabolic and Weigh Loss Surgery  Appointments: [place] - [phone]        Neurology  Appointments: [place] - [phone] [place] - [phone]  Neurosurgery  Appointments: [place] - [phone]  Obstetrics & Gynecology  Appointments: [place] - [phone] [place] - [phone]        Pediatrics  Appointments: [place] - [phone] [place] - [phone]  Physiatry  Appointments: [place] -[phone]  Physical Therapy  Appointments: [place] - [phone] [place] - [phone]        Podiatry  Appointments: [place] - [phone] [place] - [phone]  Pulmonology  Appointments: [place] - [phone]  Rheumatology  Appointments: [place] - [phone]        [place] Location: [facility]  [address] , [place]  [address]  [place] Location: [facility] [address] [place], [place]  [address]  [place] Location: [facility] [address] [place], [place]  [address]    

## 2019-08-14 NOTE — H&P (Signed)
ORTHOPAEDIC HISTORY & PHYSICAL  Progress Notes by Lamar Benes., MD at 08/12/2019 10:00 AM  Chief Complaint:     Chief Complaint  Patient presents with  . Knee Pain    Right knee degenerative arthrosis    Reason for Visit: The patient is a 64 y.o. male who presents today for reevaluation of his right knee in anticipation of right total knee arthroplasty next week.  He reports a long history of right knee pain.  He has reported a progressive increase in the knee pain over the last several months. He localizes most of the pain along the medial and anterior aspect of the knee. He reports some swelling, no locking, and some giving way of the knee. The pain is aggravated by any weight bearing, and the knee pain is beginning to limit his distance of ambulation. The patient has not appreciated any significant improvement despite Tylenol, NSAIDs, topical NSAIDs, intra-articular corticosteroid injections, and activity modification.  He has tried to continue with his exercise routine including power lifting. He is not using any ambulatory aids. The patient states that the knee pain has progressed to the point that it is significantly interfering with his activities of daily living.  Of note, the patient had polio as a child.  He had multiple surgical procedures to the lower extremities.  Medications: Current Medications        Current Outpatient Medications  Medication Sig Dispense Refill  . acetaminophen (TYLENOL) 500 MG tablet Take 500 mg by mouth as needed for Pain    . albuterol (PROVENTIL HFA) 90 mcg/actuation inhaler Inhale 2 inhalations into the lungs 2 (two) times daily    . aspirin 81 MG EC tablet Take 81 mg by mouth once daily    . magnesium oxide (MAG-OX) 400 mg (241.3 mg magnesium) tablet Take 400 mg by mouth once daily    . multivitamin with minerals tablet Take 1 tablet by mouth once daily    . lisinopril (ZESTRIL) 10 MG tablet Take 10 mg by mouth once daily         No current facility-administered medications for this visit.       Allergies:     Allergies  Allergen Reactions  . Penicillin Shortness Of Breath  . Hydrocodone Itching    Past Medical History:     Past Medical History:  Diagnosis Date  . COPD (chronic obstructive pulmonary disease) (CMS-HCC)   . Osteoarthritis   . Polio     Past Surgical History:      Past Surgical History:  Procedure Laterality Date  . APPENDECTOMY    . ARTHROSCOPY, SHOULDER, SURGICAL; WITH ROTATOR CUFF REPAIR,DEBRID EXTEN,DISTAL CLAVICULECTOMY INCLUDING DISTAL ARTICULAR SURFACE Left 12/13/2012   Blenda Mounts, MD at [place] Right 1959   acl  . KNEE ARTHROSCOPY Left 1969   bone growth excision  . KNEE ARTHROSCOPY Right 11/26/2014  . KNEE ARTHROSCOPY Right 09/02/2015  . Right rotator cuff repair Right 08/02/2012   Jarrett Ables, MD at [facility] History: Social History  Social History        Socioeconomic History  . Marital status: Legally Separated    Spouse name: Butch Penny  . Number of children: 5  . Years of education: 54  . Highest education level: Not on file  Occupational History  . Occupation: Full-time- Curator  Social Needs  . Financial resource strain: Not on file  . Food insecurity    Worry: Not on file  Inability: Not on file  . Transportation needs    Medical: Not on file    Non-medical: Not on file  Tobacco Use  . Smoking status: Current Every Day Smoker    Packs/day: 1.00    Years: 30.00    Pack years: 30.00    Types: Cigarettes  . Smokeless tobacco: Former Network engineer and Sexual Activity  . Alcohol use: Not Currently    Alcohol/week: 12.0 standard drinks    Types: 12 Cans of beer per week    Frequency: 4 or more times a week  . Drug use: No  . Sexual activity: Yes    Partners: Female  Lifestyle  . Physical activity    Days per week: Not on file    Minutes per session: Not  on file  . Stress: Not on file  Relationships  . Social Herbalist on phone: Not on file    Gets together: Not on file    Attends religious service: Not on file    Active member of club or organization: Not on file    Attends meetings of clubs or organizations: Not on file    Relationship status: Not on file  Other Topics Concern  . Not on file  Social History Narrative  . Not on file      Family History:      Family History  Problem Relation Age of Onset  . Heart disease Father     Review of Systems: A comprehensive 14 point ROS was performed, reviewed, and the pertinent orthopaedic findings are documented in the HPI.  Exam BP 108/78   Temp 36.2 C (97.2 F)   Ht 167.6 cm (5\' 6" )   Wt 90.4 kg (199 lb 3.2 oz)   BMI 32.15 kg/m   General:  Well-developed, well-nourished male seen in no acute distress.  Antalgic gait. Varus thrust to the right knee.  HEENT:  Atraumatic, normocephalic.  Pupils are equal and reactive to light.  Extraocular motion is intact. Sclera are clear.  Oropharynx is clear with moist mucosa.  Neck:  Supple, nontender, and with good ROM. No thyromegaly, adenopathy, JVD, or carotid bruits.  Lungs:  Clear to auscultation bilaterally.  Cardiovascular:  Regular rate and rhythm.  Normal S1, S2.  No murmur .  No appreciable gallops or rubs. Peripheral pulses are palpable.  No lower extremity edema.  Homan`s test is negative.  Abdomen:  Soft, nontender, nondistended.  Bowel sounds are present.  Extremities: Good strength, stability, and range of motion of the upper extremities. Good range of motion of the hips and ankles.  Right  Knee:    Soft tissue swelling: minimal    Effusion:                   none    Erythema:                 none    Crepitance:               mild    Tenderness:             medial, anterior    Alignment:                relative varus    Mediolateral laxity:   medial pseudolaxity     Posterior sag:           negative    Patellar tracking:      Good tracking without  evidence of subluxation or tilt    Atrophy:                    No significant atrophy.                                       Quadriceps tone was good.    Range of motion:     0/4/85 degrees There is a well-healed surgical scar along the lateral aspect of the lower thigh.  Also of note are 2 surgical scars along the medial aspect of the knee with marked indentation and apparent loss of some subcutaneous tissue.  Neurologic:  Awake, alert, and oriented.  Sensory function is intact to pinprick and light touch.   Motor strength is judged to be 5/5.   Motor coordination is within normal limits.   No apparent clonus. No tremor.    X-rays: I reviewed the right knee radiographs that were performed at [facility] on 06/20/2019. There is significant narrowing of the medial cartilage space with bone-on-bone articulation and associated varus alignment.  Osteophyte formation is noted. Subchondral sclerosis is noted.  The intercondylar sulcus is relatively shallow and there are degenerative changes to the patellofemoral articulation.  No evidence of fracture or dislocation.   Impression: Degenerative arthrosis of the right knee  Plan:   The findings were discussed in detail with the patient.  Conservative treatment options were again reviewed with the patient.  We discussed the risks and benefits of surgical intervention.  The usual perioperative course was also discussed in detail.  The patient expressed understanding of the risks and benefits of surgical intervention and would like to proceed with plans for right total knee arthroplasty.  We again discussed my concerns about the patient's skin and soft tissue integrity. I spoke to Dr. Marla Roe (Plastic Surgery) and reviewed pictures of the patient's previous scars. Although I anticipate slightly altering the skin incision laterally, there is still the risk of  wound healing issues that could require skin grafting or even flap coverage. I assured the patient that Dr. Marla Roe would be available for consultation as needed.  MEDICAL CLEARANCE: Per anesthesiology. ACTIVITY: As tolerated. WORK STATUS: Anticipate out of work for 6-8 weeks following surgery. THERAPY: Preoperative physical therapy evaluation. MEDICATIONS: Requested Prescriptions    No prescriptions requested or ordered in this encounter   FOLLOW-UP: Return for postoperative follow-up.  Tishawna Larouche P. Holley Bouche., M.D.  This note was generated in part with voice recognition software and I apologize for any typographical errors that were not detected and corrected.     Electronically signed by Lamar Benes., MD on 08/12/2019 12:32 PM

## 2019-08-15 ENCOUNTER — Other Ambulatory Visit: Payer: Self-pay

## 2019-08-15 ENCOUNTER — Encounter
Admission: RE | Admit: 2019-08-15 | Discharge: 2019-08-15 | Disposition: A | Discharge status: Home / Self Care | Payer: Medicare Other | Source: Ambulatory Visit | Attending: Orthopedic Surgery | Admitting: Orthopedic Surgery

## 2019-08-15 NOTE — Patient Instructions (Signed)
Your procedure is scheduled on: 08/21/19 Report to [facility]. To find out your arrival time please call [phone] between 1PM - 3PM on 08/20/19.  Remember: Instructions that are not followed completely may result in serious medical risk, up to and including death, or upon the discretion of your surgeon and anesthesiologist your surgery may need to be rescheduled.     _X__ 1. Do not eat food after midnight the night before your procedure.                 No gum chewing or hard candies. You may drink clear liquids up to 2 hours                 before you are scheduled to arrive for your surgery- DO not drink clear                 liquids within 2 hours of the start of your surgery.                 Clear Liquids include:  water, apple juice without pulp, clear carbohydrate                 drink such as Clearfast or Gatorade, Black Coffee or Tea (Do not add                 anything to coffee or tea). Diabetics water only  __X__2.  On the morning of surgery brush your teeth with toothpaste and water, you                 may rinse your mouth with mouthwash if you wish.  Do not swallow any              toothpaste of mouthwash.     _X__ 3.  No Alcohol for 24 hours before or after surgery.   _X__ 4.  Do Not Smoke or use e-cigarettes For 24 Hours Prior to Your Surgery.                 Do not use any chewable tobacco products for at least 6 hours prior to                 surgery.  ____  5.  Bring all medications with you on the day of surgery if instructed.   __X__  6.  Notify your doctor if there is any change in your medical condition      (cold, fever, infections).     Do not wear jewelry, make-up, hairpins, clips or nail polish. Do not wear lotions, powders, or perfumes.  Do not shave 48 hours prior to surgery. Men may shave face and neck. Do not bring valuables to the hospital.    [facility] is not responsible for any belongings or  valuables.  Contacts, dentures/partials or body piercings may not be worn into surgery. Bring a case for your contacts, glasses or hearing aids, a denture cup will be supplied. Leave your suitcase in the car. After surgery it may be brought to your room. For patients admitted to the hospital, discharge time is determined by your treatment team.   Patients discharged the day of surgery will not be allowed to drive home.   Please read over the following fact sheets that you were given:   MRSA Information  __X__ Take these medicines the morning of surgery with A SIP OF WATER:  1. NONE  2.   3.   4.  5.  6.  ____ Fleet Enema (as directed)   __X__ Use CHG Soap/SAGE wipes as directed  ____ Use inhalers on the day of surgery  ____ Stop metformin/Janumet/Farxiga 2 days prior to surgery    ____ Take 1/2 of usual insulin dose the night before surgery. No insulin the morning          of surgery.   ____ Stop Blood Thinners Coumadin/Plavix/Xarelto/Pleta/Pradaxa/Eliquis/Effient/Aspirin  on   Or contact your Surgeon, Cardiologist or Medical Doctor regarding  ability to stop your blood thinners  __X__ Stop Anti-inflammatories 7 days before surgery such as Advil, Ibuprofen, Motrin,  BC or Goodies Powder, Naprosyn, Naproxen, Aleve, Aspirin    __X__ Stop all herbal supplements, fish oil or vitamin E until after surgery.    ____ Bring C-Pap to the hospital.    Ensure Pre Surgery drink to be finished 2 hours prior to arrival. [employer] and instructions provided.

## 2019-08-16 ENCOUNTER — Encounter
Admission: RE | Admit: 2019-08-16 | Discharge: 2019-08-16 | Disposition: A | Discharge status: Home / Self Care | Payer: Medicare Other | Source: Ambulatory Visit | Attending: Orthopedic Surgery | Admitting: Orthopedic Surgery

## 2019-08-16 DIAGNOSIS — Z01818 Encounter for other preprocedural examination: Secondary | ICD-10-CM | POA: Insufficient documentation

## 2019-08-16 LAB — CBC
HCT: 46.9 % (ref 39.0–52.0)
Hemoglobin: 15.7 g/dL (ref 13.0–17.0)
MCH: 32.3 pg (ref 26.0–34.0)
MCHC: 33.5 g/dL (ref 30.0–36.0)
MCV: 96.5 fL (ref 80.0–100.0)
Platelets: 240 10*3/uL (ref 150–400)
RBC: 4.86 MIL/uL (ref 4.22–5.81)
RDW: 14 % (ref 11.5–15.5)
WBC: 6 10*3/uL (ref 4.0–10.5)
nRBC: 0 % (ref 0.0–0.2)

## 2019-08-16 LAB — TYPE AND SCREEN
ABO/RH(D): A POS
Antibody Screen: NEGATIVE

## 2019-08-16 LAB — SURGICAL PCR SCREEN
MRSA, PCR: NEGATIVE
Staphylococcus aureus: NEGATIVE

## 2019-08-16 LAB — URINALYSIS, ROUTINE W REFLEX MICROSCOPIC
Bacteria, UA: NONE SEEN
Bilirubin Urine: NEGATIVE
Glucose, UA: NEGATIVE mg/dL
Hgb urine dipstick: NEGATIVE
Ketones, ur: NEGATIVE mg/dL
Leukocytes,Ua: NEGATIVE
Nitrite: NEGATIVE
Protein, ur: 30 mg/dL — AB
Specific Gravity, Urine: 1.021 (ref 1.005–1.030)
Squamous Epithelial / HPF: NONE SEEN (ref 0–5)
pH: 7 (ref 5.0–8.0)

## 2019-08-16 LAB — COMPREHENSIVE METABOLIC PANEL
ALT: 22 U/L (ref 0–44)
AST: 29 U/L (ref 15–41)
Albumin: 3.6 g/dL (ref 3.5–5.0)
Alkaline Phosphatase: 43 U/L (ref 38–126)
Anion gap: 9 (ref 5–15)
BUN: 8 mg/dL (ref 8–23)
CO2: 27 mmol/L (ref 22–32)
Calcium: 8.9 mg/dL (ref 8.9–10.3)
Chloride: 101 mmol/L (ref 98–111)
Creatinine, Ser: 0.86 mg/dL (ref 0.61–1.24)
GFR calc Af Amer: >60 mL/min (ref >60)
GFR calc non Af Amer: >60 mL/min (ref >60)
Glucose, Bld: 81 mg/dL (ref 70–99)
Potassium: 3.7 mmol/L (ref 3.5–5.1)
Sodium: 137 mmol/L (ref 135–145)
Total Bilirubin: 0.7 mg/dL (ref 0.3–1.2)
Total Protein: 7.5 g/dL (ref 6.5–8.1)

## 2019-08-16 LAB — PROTIME-INR
INR: 1 (ref 0.8–1.2)
Prothrombin Time: 13.1 seconds (ref 11.4–15.2)

## 2019-08-16 LAB — SEDIMENTATION RATE: Sed Rate: 9 mm/hr (ref 0–20)

## 2019-08-16 LAB — C-REACTIVE PROTEIN: CRP: 1.1 mg/dL — ABNORMAL HIGH (ref <1.0)

## 2019-08-16 LAB — APTT: aPTT: 31 seconds (ref 24–36)

## 2019-08-19 ENCOUNTER — Other Ambulatory Visit
Admission: RE | Admit: 2019-08-19 | Discharge: 2019-08-19 | Disposition: A | Discharge status: Home / Self Care | Payer: Medicare Other | Source: Ambulatory Visit | Attending: Orthopedic Surgery | Admitting: Orthopedic Surgery

## 2019-08-19 ENCOUNTER — Other Ambulatory Visit: Payer: Self-pay

## 2019-08-19 LAB — SARS CORONAVIRUS 2 (TAT 6-24 HRS): SARS Coronavirus 2: NEGATIVE

## 2019-08-20 ENCOUNTER — Encounter: Payer: Self-pay | Admitting: Orthopedic Surgery

## 2019-08-20 LAB — URINE CULTURE
Culture: NO GROWTH
Special Requests: NORMAL

## 2019-08-20 MED ORDER — TRANEXAMIC ACID-NACL 1000-0.7 MG/100ML-% IV SOLN: 1000.0000 mg | INTRAVENOUS | Status: DC

## 2019-08-20 MED ORDER — CLINDAMYCIN PHOSPHATE 900 MG/50ML IV SOLN: 900.0000 mg | INTRAVENOUS | Status: DC

## 2019-08-21 ENCOUNTER — Inpatient Hospital Stay: Payer: Medicare Other | Admitting: Anesthesiology

## 2019-08-21 ENCOUNTER — Encounter
Admission: RE | Disposition: A | Discharge status: Home / Self Care | Payer: Self-pay | Source: Home / Self Care | Attending: Orthopedic Surgery

## 2019-08-21 ENCOUNTER — Other Ambulatory Visit: Payer: Self-pay

## 2019-08-21 ENCOUNTER — Encounter: Payer: Self-pay | Admitting: Orthopedic Surgery

## 2019-08-21 ENCOUNTER — Inpatient Hospital Stay: Payer: Medicare Other

## 2019-08-21 ENCOUNTER — Inpatient Hospital Stay
Admission: RE | Admit: 2019-08-21 | Discharge: 2019-08-22 | DRG: 470 | Disposition: A | Discharge status: Home Health | Payer: Medicare Other | Attending: Orthopedic Surgery | Admitting: Orthopedic Surgery

## 2019-08-21 DIAGNOSIS — Z23 Encounter for immunization: Secondary | ICD-10-CM

## 2019-08-21 DIAGNOSIS — Z8612 Personal history of poliomyelitis: Secondary | ICD-10-CM | POA: Diagnosis not present

## 2019-08-21 DIAGNOSIS — Z7982 Long term (current) use of aspirin: Secondary | ICD-10-CM

## 2019-08-21 DIAGNOSIS — Z8673 Personal history of transient ischemic attack (TIA), and cerebral infarction without residual deficits: Secondary | ICD-10-CM

## 2019-08-21 DIAGNOSIS — I10 Essential (primary) hypertension: Secondary | ICD-10-CM | POA: Diagnosis present

## 2019-08-21 DIAGNOSIS — Z79899 Other long term (current) drug therapy: Secondary | ICD-10-CM

## 2019-08-21 DIAGNOSIS — I2510 Atherosclerotic heart disease of native coronary artery without angina pectoris: Secondary | ICD-10-CM | POA: Diagnosis present

## 2019-08-21 DIAGNOSIS — M1711 Unilateral primary osteoarthritis, right knee: Secondary | ICD-10-CM | POA: Diagnosis present

## 2019-08-21 DIAGNOSIS — M25561 Pain in right knee: Secondary | ICD-10-CM | POA: Diagnosis present

## 2019-08-21 DIAGNOSIS — K219 Gastro-esophageal reflux disease without esophagitis: Secondary | ICD-10-CM | POA: Diagnosis present

## 2019-08-21 DIAGNOSIS — E039 Hypothyroidism, unspecified: Secondary | ICD-10-CM | POA: Diagnosis present

## 2019-08-21 DIAGNOSIS — F17210 Nicotine dependence, cigarettes, uncomplicated: Secondary | ICD-10-CM | POA: Diagnosis present

## 2019-08-21 DIAGNOSIS — J449 Chronic obstructive pulmonary disease, unspecified: Secondary | ICD-10-CM | POA: Diagnosis present

## 2019-08-21 DIAGNOSIS — Z20822 Contact with and (suspected) exposure to covid-19: Secondary | ICD-10-CM | POA: Diagnosis present

## 2019-08-21 DIAGNOSIS — Z96659 Presence of unspecified artificial knee joint: Secondary | ICD-10-CM

## 2019-08-21 HISTORY — PX: KNEE ARTHROPLASTY: SHX992

## 2019-08-21 LAB — ABO/RH: ABO/RH(D): A POS

## 2019-08-21 SURGERY — ARTHROPLASTY, KNEE, TOTAL, USING IMAGELESS COMPUTER-ASSISTED NAVIGATION
Anesthesia: Spinal | Site: Knee | Laterality: Right

## 2019-08-21 MED ORDER — BUPIVACAINE HCL (PF) 0.5 % IJ SOLN
INTRAMUSCULAR | Status: AC
  Filled 2019-08-21: qty 10

## 2019-08-21 MED ORDER — ACETAMINOPHEN 325 MG PO TABS: 325.0000 mg | ORAL_TABLET | Freq: Four times a day (QID) | ORAL | Status: DC | PRN

## 2019-08-21 MED ORDER — FAMOTIDINE 20 MG PO TABS: 20.0000 mg | ORAL_TABLET | Freq: Once | ORAL | Status: AC

## 2019-08-21 MED ORDER — SODIUM CHLORIDE 0.9 % IV SOLN: INTRAVENOUS | Status: DC

## 2019-08-21 MED ORDER — DEXAMETHASONE SODIUM PHOSPHATE 10 MG/ML IJ SOLN
INTRAMUSCULAR | Status: AC
  Administered 2019-08-21: 07:00:00 8 mg via INTRAVENOUS
  Filled 2019-08-21: qty 1

## 2019-08-21 MED ORDER — CELECOXIB 200 MG PO CAPS
ORAL_CAPSULE | ORAL | Status: AC
  Administered 2019-08-21: 06:00:00 400 mg via ORAL
  Filled 2019-08-21: qty 2

## 2019-08-21 MED ORDER — PROPOFOL 500 MG/50ML IV EMUL
INTRAVENOUS | Status: AC
  Filled 2019-08-21: qty 50

## 2019-08-21 MED ORDER — OXYCODONE HCL 5 MG PO TABS
5.0000 mg | ORAL_TABLET | ORAL | Status: DC | PRN
  Administered 2019-08-21 (×2): 5 mg via ORAL
  Filled 2019-08-21 (×2): qty 1

## 2019-08-21 MED ORDER — PROPOFOL 500 MG/50ML IV EMUL
INTRAVENOUS | Status: DC | PRN
  Administered 2019-08-21: 100 ug/kg/min via INTRAVENOUS

## 2019-08-21 MED ORDER — MAGNESIUM HYDROXIDE 400 MG/5ML PO SUSP
30.0000 mL | Freq: Every day | ORAL | Status: DC
  Filled 2019-08-21: qty 30

## 2019-08-21 MED ORDER — DIPHENHYDRAMINE HCL 12.5 MG/5ML PO ELIX
12.5000 mg | ORAL_SOLUTION | ORAL | Status: DC | PRN
  Administered 2019-08-22: 25 mg via ORAL
  Filled 2019-08-21: qty 10

## 2019-08-21 MED ORDER — TRANEXAMIC ACID-NACL 1000-0.7 MG/100ML-% IV SOLN
INTRAVENOUS | Status: AC
  Administered 2019-08-21: 13:00:00 1000 mg via INTRAVENOUS
  Filled 2019-08-21: qty 100

## 2019-08-21 MED ORDER — ONDANSETRON HCL 4 MG PO TABS: 4.0000 mg | ORAL_TABLET | Freq: Four times a day (QID) | ORAL | Status: DC | PRN

## 2019-08-21 MED ORDER — FENTANYL CITRATE (PF) 100 MCG/2ML IJ SOLN: 25.0000 ug | INTRAMUSCULAR | Status: DC | PRN

## 2019-08-21 MED ORDER — GABAPENTIN 300 MG PO CAPS
ORAL_CAPSULE | ORAL | Status: AC
  Administered 2019-08-21: 06:00:00 300 mg via ORAL
  Filled 2019-08-21: qty 1

## 2019-08-21 MED ORDER — CLINDAMYCIN PHOSPHATE 900 MG/50ML IV SOLN
INTRAVENOUS | Status: AC
  Filled 2019-08-21: qty 50

## 2019-08-21 MED ORDER — FENTANYL CITRATE (PF) 100 MCG/2ML IJ SOLN
INTRAMUSCULAR | Status: DC | PRN
  Administered 2019-08-21 (×2): 50 ug via INTRAVENOUS

## 2019-08-21 MED ORDER — PHENYLEPHRINE HCL (PRESSORS) 10 MG/ML IV SOLN
INTRAVENOUS | Status: DC | PRN
  Administered 2019-08-21: 100 ug via INTRAVENOUS
  Administered 2019-08-21: 50 ug via INTRAVENOUS
  Administered 2019-08-21: 100 ug via INTRAVENOUS

## 2019-08-21 MED ORDER — OXYCODONE HCL 5 MG/5ML PO SOLN: 5.0000 mg | Freq: Once | ORAL | Status: DC | PRN

## 2019-08-21 MED ORDER — LISINOPRIL 10 MG PO TABS
10.0000 mg | ORAL_TABLET | Freq: Every day | ORAL | Status: DC
  Administered 2019-08-22: 10:00:00 10 mg via ORAL
  Filled 2019-08-21: qty 1

## 2019-08-21 MED ORDER — FENTANYL CITRATE (PF) 100 MCG/2ML IJ SOLN
INTRAMUSCULAR | Status: AC
  Filled 2019-08-21: qty 2

## 2019-08-21 MED ORDER — METOCLOPRAMIDE HCL 5 MG/ML IJ SOLN: 5.0000 mg | Freq: Three times a day (TID) | INTRAMUSCULAR | Status: DC | PRN

## 2019-08-21 MED ORDER — HYDROMORPHONE HCL 1 MG/ML IJ SOLN
0.5000 mg | INTRAMUSCULAR | Status: DC | PRN
  Administered 2019-08-21: 20:00:00 1 mg via INTRAVENOUS
  Filled 2019-08-21: qty 1

## 2019-08-21 MED ORDER — FLEET ENEMA 7-19 GM/118ML RE ENEM: 1.0000 | ENEMA | Freq: Once | RECTAL | Status: DC | PRN

## 2019-08-21 MED ORDER — LACTATED RINGERS IV SOLN: INTRAVENOUS | Status: DC

## 2019-08-21 MED ORDER — METOCLOPRAMIDE HCL 10 MG PO TABS
10.0000 mg | ORAL_TABLET | Freq: Three times a day (TID) | ORAL | Status: DC
  Administered 2019-08-21 – 2019-08-22 (×5): 10 mg via ORAL
  Filled 2019-08-21 (×5): qty 1

## 2019-08-21 MED ORDER — SENNOSIDES-DOCUSATE SODIUM 8.6-50 MG PO TABS
1.0000 | ORAL_TABLET | Freq: Two times a day (BID) | ORAL | Status: DC
  Administered 2019-08-21: 1 via ORAL
  Filled 2019-08-21 (×2): qty 1

## 2019-08-21 MED ORDER — CELECOXIB 200 MG PO CAPS
200.0000 mg | ORAL_CAPSULE | Freq: Two times a day (BID) | ORAL | Status: DC
  Administered 2019-08-21 – 2019-08-22 (×2): 200 mg via ORAL
  Filled 2019-08-21 (×3): qty 1

## 2019-08-21 MED ORDER — GABAPENTIN 300 MG PO CAPS: 300.0000 mg | ORAL_CAPSULE | Freq: Once | ORAL | Status: AC

## 2019-08-21 MED ORDER — BISACODYL 10 MG RE SUPP: 10.0000 mg | Freq: Every day | RECTAL | Status: DC | PRN

## 2019-08-21 MED ORDER — CELECOXIB 200 MG PO CAPS: 400.0000 mg | ORAL_CAPSULE | Freq: Once | ORAL | Status: AC

## 2019-08-21 MED ORDER — NEOMYCIN-POLYMYXIN B GU 40-200000 IR SOLN
Status: DC | PRN
  Administered 2019-08-21: 16 mL

## 2019-08-21 MED ORDER — SODIUM CHLORIDE 0.9 % IV SOLN
INTRAVENOUS | Status: DC | PRN
  Administered 2019-08-21: 60 mL

## 2019-08-21 MED ORDER — FERROUS SULFATE 325 (65 FE) MG PO TABS
325.0000 mg | ORAL_TABLET | Freq: Two times a day (BID) | ORAL | Status: DC
  Administered 2019-08-21 – 2019-08-22 (×3): 325 mg via ORAL
  Filled 2019-08-21 (×3): qty 1

## 2019-08-21 MED ORDER — NEOMYCIN-POLYMYXIN B GU 40-200000 IR SOLN
Status: AC
  Filled 2019-08-21: qty 20

## 2019-08-21 MED ORDER — PHENYLEPHRINE HCL (PRESSORS) 10 MG/ML IV SOLN
INTRAVENOUS | Status: AC
  Filled 2019-08-21: qty 1

## 2019-08-21 MED ORDER — ACETAMINOPHEN 10 MG/ML IV SOLN
1000.0000 mg | Freq: Four times a day (QID) | INTRAVENOUS | Status: AC
  Administered 2019-08-21 – 2019-08-22 (×4): 1000 mg via INTRAVENOUS
  Filled 2019-08-21 (×4): qty 100

## 2019-08-21 MED ORDER — ONDANSETRON HCL 4 MG/2ML IJ SOLN: 4.0000 mg | Freq: Four times a day (QID) | INTRAMUSCULAR | Status: DC | PRN

## 2019-08-21 MED ORDER — TRANEXAMIC ACID-NACL 1000-0.7 MG/100ML-% IV SOLN
INTRAVENOUS | Status: DC | PRN
  Administered 2019-08-21: 1000 mg via INTRAVENOUS

## 2019-08-21 MED ORDER — CLINDAMYCIN PHOSPHATE 900 MG/50ML IV SOLN
INTRAVENOUS | Status: DC | PRN
  Administered 2019-08-21: 900 mg via INTRAVENOUS

## 2019-08-21 MED ORDER — BUPIVACAINE LIPOSOME 1.3 % IJ SUSP
INTRAMUSCULAR | Status: AC
  Filled 2019-08-21: qty 20

## 2019-08-21 MED ORDER — MENTHOL 3 MG MT LOZG
1.0000 | LOZENGE | OROMUCOSAL | Status: DC | PRN
  Filled 2019-08-21: qty 9

## 2019-08-21 MED ORDER — MIDAZOLAM HCL 5 MG/5ML IJ SOLN
INTRAMUSCULAR | Status: DC | PRN
  Administered 2019-08-21: 2 mg via INTRAVENOUS

## 2019-08-21 MED ORDER — GABAPENTIN 300 MG PO CAPS
300.0000 mg | ORAL_CAPSULE | Freq: Every day | ORAL | Status: DC
  Administered 2019-08-21: 22:00:00 300 mg via ORAL
  Filled 2019-08-21: qty 1

## 2019-08-21 MED ORDER — CLINDAMYCIN PHOSPHATE 600 MG/50ML IV SOLN
600.0000 mg | Freq: Four times a day (QID) | INTRAVENOUS | Status: AC
  Administered 2019-08-21 – 2019-08-22 (×4): 600 mg via INTRAVENOUS
  Filled 2019-08-21 (×4): qty 50

## 2019-08-21 MED ORDER — FAMOTIDINE 20 MG PO TABS
ORAL_TABLET | ORAL | Status: AC
  Administered 2019-08-21: 06:00:00 20 mg via ORAL
  Filled 2019-08-21: qty 1

## 2019-08-21 MED ORDER — PROPOFOL 10 MG/ML IV BOLUS
INTRAVENOUS | Status: AC
  Filled 2019-08-21: qty 20

## 2019-08-21 MED ORDER — ENOXAPARIN SODIUM 30 MG/0.3ML ~~LOC~~ SOLN
30.0000 mg | Freq: Two times a day (BID) | SUBCUTANEOUS | Status: DC
  Administered 2019-08-22: 30 mg via SUBCUTANEOUS
  Filled 2019-08-21: qty 0.3

## 2019-08-21 MED ORDER — PNEUMOCOCCAL VAC POLYVALENT 25 MCG/0.5ML IJ INJ
0.5000 mL | INJECTION | INTRAMUSCULAR | Status: AC
  Administered 2019-08-22: 10:00:00 0.5 mL via INTRAMUSCULAR
  Filled 2019-08-21: qty 0.5

## 2019-08-21 MED ORDER — PROPOFOL 10 MG/ML IV BOLUS
INTRAVENOUS | Status: DC | PRN
  Administered 2019-08-21: 30 mg via INTRAVENOUS

## 2019-08-21 MED ORDER — TRANEXAMIC ACID-NACL 1000-0.7 MG/100ML-% IV SOLN
INTRAVENOUS | Status: AC
  Filled 2019-08-21: qty 100

## 2019-08-21 MED ORDER — METOCLOPRAMIDE HCL 10 MG PO TABS: 5.0000 mg | ORAL_TABLET | Freq: Three times a day (TID) | ORAL | Status: DC | PRN

## 2019-08-21 MED ORDER — BUPIVACAINE HCL (PF) 0.25 % IJ SOLN
INTRAMUSCULAR | Status: AC
  Filled 2019-08-21: qty 30

## 2019-08-21 MED ORDER — ENSURE ENLIVE PO LIQD: 296.0000 mL | Freq: Once | ORAL | Status: DC

## 2019-08-21 MED ORDER — PANTOPRAZOLE SODIUM 40 MG PO TBEC
40.0000 mg | DELAYED_RELEASE_TABLET | Freq: Two times a day (BID) | ORAL | Status: DC
  Administered 2019-08-21 – 2019-08-22 (×2): 40 mg via ORAL
  Filled 2019-08-21 (×2): qty 1

## 2019-08-21 MED ORDER — TRAMADOL HCL 50 MG PO TABS
50.0000 mg | ORAL_TABLET | ORAL | Status: DC | PRN
  Administered 2019-08-21 – 2019-08-22 (×4): 100 mg via ORAL
  Filled 2019-08-21 (×4): qty 2

## 2019-08-21 MED ORDER — PHENOL 1.4 % MT LIQD
1.0000 | OROMUCOSAL | Status: DC | PRN
  Filled 2019-08-21: qty 177

## 2019-08-21 MED ORDER — BUPIVACAINE HCL (PF) 0.25 % IJ SOLN
INTRAMUSCULAR | Status: DC | PRN
  Administered 2019-08-21: 60 mL

## 2019-08-21 MED ORDER — DEXAMETHASONE SODIUM PHOSPHATE 10 MG/ML IJ SOLN: 8.0000 mg | Freq: Once | INTRAMUSCULAR | Status: AC

## 2019-08-21 MED ORDER — OXYCODONE HCL 5 MG PO TABS: 5.0000 mg | ORAL_TABLET | Freq: Once | ORAL | Status: DC | PRN

## 2019-08-21 MED ORDER — OXYCODONE HCL 5 MG PO TABS
10.0000 mg | ORAL_TABLET | ORAL | Status: DC | PRN
  Administered 2019-08-21 – 2019-08-22 (×4): 10 mg via ORAL
  Filled 2019-08-21 (×4): qty 2

## 2019-08-21 MED ORDER — MIDAZOLAM HCL 2 MG/2ML IJ SOLN
INTRAMUSCULAR | Status: AC
  Filled 2019-08-21: qty 2

## 2019-08-21 MED ORDER — TRANEXAMIC ACID-NACL 1000-0.7 MG/100ML-% IV SOLN: 1000.0000 mg | Freq: Once | INTRAVENOUS | Status: AC

## 2019-08-21 MED ORDER — ACETAMINOPHEN 10 MG/ML IV SOLN
INTRAVENOUS | Status: DC | PRN
  Administered 2019-08-21: 1000 mg via INTRAVENOUS

## 2019-08-21 MED ORDER — MAGNESIUM OXIDE 400 (241.3 MG) MG PO TABS
800.0000 mg | ORAL_TABLET | Freq: Two times a day (BID) | ORAL | Status: DC
  Administered 2019-08-21 – 2019-08-22 (×2): 800 mg via ORAL
  Filled 2019-08-21 (×2): qty 2

## 2019-08-21 MED ORDER — ADULT MULTIVITAMIN W/MINERALS CH
1.0000 | ORAL_TABLET | Freq: Every day | ORAL | Status: DC
  Administered 2019-08-22: 10:00:00 1 via ORAL
  Filled 2019-08-21: qty 1

## 2019-08-21 MED ORDER — ALBUTEROL SULFATE (2.5 MG/3ML) 0.083% IN NEBU
3.0000 mL | INHALATION_SOLUTION | Freq: Two times a day (BID) | RESPIRATORY_TRACT | Status: DC
  Administered 2019-08-21 (×2): 3 mL via RESPIRATORY_TRACT
  Filled 2019-08-21 (×3): qty 3

## 2019-08-21 MED ORDER — SODIUM CHLORIDE FLUSH 0.9 % IV SOLN
INTRAVENOUS | Status: AC
  Filled 2019-08-21: qty 40

## 2019-08-21 MED ORDER — ACETAMINOPHEN 10 MG/ML IV SOLN
INTRAVENOUS | Status: AC
  Filled 2019-08-21: qty 100

## 2019-08-21 MED ORDER — ALUM & MAG HYDROXIDE-SIMETH 200-200-20 MG/5ML PO SUSP
30.0000 mL | ORAL | Status: DC | PRN
  Administered 2019-08-22: 07:00:00 30 mL via ORAL
  Filled 2019-08-21: qty 30

## 2019-08-21 MED ORDER — CHLORHEXIDINE GLUCONATE 4 % EX LIQD: 60.0000 mL | Freq: Once | CUTANEOUS | Status: DC

## 2019-08-21 SURGICAL SUPPLY — 82 items
BATTERY INSTRU NAVIGATION (MISCELLANEOUS) ×12 IMPLANT
BLADE SAW 70X12.5 (BLADE) ×3 IMPLANT
BLADE SAW 90X13X1.19 OSCILLAT (BLADE) ×3 IMPLANT
BLADE SAW 90X25X1.19 OSCILLAT (BLADE) ×3 IMPLANT
BONE CEMENT GENTAMICIN (Cement) ×6 IMPLANT
BTRY SRG DRVR LF (MISCELLANEOUS) ×4
CANISTER SUCT 3000ML PPV (MISCELLANEOUS) ×3 IMPLANT
CEMENT BONE GENTAMICIN 40 (Cement) IMPLANT
CEMENT TIBIA MBT SIZE 4 (Knees) IMPLANT
COOLER POLAR GLACIER W/PUMP (MISCELLANEOUS) ×3 IMPLANT
COVER WAND RF STERILE (DRAPES) ×3 IMPLANT
CUFF TOURN SGL QUICK 24 (TOURNIQUET CUFF)
CUFF TOURN SGL QUICK 30 (TOURNIQUET CUFF) ×3
CUFF TRNQT CYL 24X4X16.5-23 (TOURNIQUET CUFF) IMPLANT
CUFF TRNQT CYL 30X4X21-28X (TOURNIQUET CUFF) IMPLANT
DRAPE 3/4 80X56 (DRAPES) ×3 IMPLANT
DRSG DERMACEA 8X12 NADH (GAUZE/BANDAGES/DRESSINGS) ×3 IMPLANT
DRSG OPSITE POSTOP 4X12 (GAUZE/BANDAGES/DRESSINGS) ×2 IMPLANT
DRSG OPSITE POSTOP 4X14 (GAUZE/BANDAGES/DRESSINGS) ×3 IMPLANT
DRSG TEGADERM 4X4.75 (GAUZE/BANDAGES/DRESSINGS) ×3 IMPLANT
DURAPREP 26ML APPLICATOR (WOUND CARE) ×6 IMPLANT
ELECT REM PT RETURN 9FT ADLT (ELECTROSURGICAL) ×3
ELECTRODE REM PT RTRN 9FT ADLT (ELECTROSURGICAL) ×1 IMPLANT
EX-PIN ORTHOLOCK NAV 4X150 (PIN) ×6 IMPLANT
FEMUR SIGMA PS SZ 4.0 R (Femur) ×2 IMPLANT
GAUZE SPONGE 4X4 12PLY STRL (GAUZE/BANDAGES/DRESSINGS) ×2 IMPLANT
GLOVE BIO SURGEON STRL SZ7.5 (GLOVE) ×6 IMPLANT
GLOVE BIOGEL M STRL SZ7.5 (GLOVE) ×6 IMPLANT
GLOVE BIOGEL PI IND STRL 7.5 (GLOVE) ×1 IMPLANT
GLOVE BIOGEL PI INDICATOR 7.5 (GLOVE) ×2
GLOVE INDICATOR 8.0 STRL GRN (GLOVE) ×3 IMPLANT
GOWN STRL REUS W/ TWL LRG LVL3 (GOWN DISPOSABLE) ×2 IMPLANT
GOWN STRL REUS W/ TWL XL LVL3 (GOWN DISPOSABLE) ×1 IMPLANT
GOWN STRL REUS W/TWL LRG LVL3 (GOWN DISPOSABLE) ×6
GOWN STRL REUS W/TWL XL LVL3 (GOWN DISPOSABLE) ×3
HEMOVAC 400CC 10FR (MISCELLANEOUS) ×3 IMPLANT
HOLDER FOLEY CATH W/STRAP (MISCELLANEOUS) ×3 IMPLANT
HOOD PEEL AWAY FLYTE STAYCOOL (MISCELLANEOUS) ×6 IMPLANT
KIT TURNOVER KIT A (KITS) ×3 IMPLANT
KNIFE SCULPS 14X20 (INSTRUMENTS) ×3 IMPLANT
LABEL OR SOLS (LABEL) ×3 IMPLANT
MANIFOLD NEPTUNE II (INSTRUMENTS) ×3 IMPLANT
NDL SAFETY ECLIPSE 18X1.5 (NEEDLE) ×1 IMPLANT
NDL SPNL 20GX3.5 QUINCKE YW (NEEDLE) ×2 IMPLANT
NEEDLE HYPO 18GX1.5 SHARP (NEEDLE) ×3
NEEDLE SPNL 20GX3.5 QUINCKE YW (NEEDLE) ×6 IMPLANT
NS IRRIG 500ML POUR BTL (IV SOLUTION) ×3 IMPLANT
PACK TOTAL KNEE (MISCELLANEOUS) ×3 IMPLANT
PAD ABD DERMACEA PRESS 5X9 (GAUZE/BANDAGES/DRESSINGS) ×2 IMPLANT
PAD CAST CTTN 4X4 STRL (SOFTGOODS) IMPLANT
PAD WRAPON POLAR KNEE (MISCELLANEOUS) ×1 IMPLANT
PADDING CAST COTTON 4X4 STRL (SOFTGOODS) ×3
PATELLA DOME PFC 38MM (Knees) ×2 IMPLANT
PENCIL SMOKE EVACUATOR COATED (MISCELLANEOUS) ×3 IMPLANT
PENCIL SMOKE ULTRAEVAC 22 CON (MISCELLANEOUS) ×3 IMPLANT
PIN DRILL QUICK PACK ×7 IMPLANT
PIN FIXATION 1/8DIA X 3INL (PIN) ×9 IMPLANT
PLATE ROT INSERT 10MM SIZE 4 (Plate) ×2 IMPLANT
PULSAVAC PLUS IRRIG FAN TIP (DISPOSABLE) ×3
SOL .9 NS 3000ML IRR  AL (IV SOLUTION) ×2
SOL .9 NS 3000ML IRR AL (IV SOLUTION) ×1
SOL .9 NS 3000ML IRR UROMATIC (IV SOLUTION) ×1 IMPLANT
SOL PREP PVP 2OZ (MISCELLANEOUS) ×3
SOLUTION PREP PVP 2OZ (MISCELLANEOUS) ×1 IMPLANT
SPONGE DRAIN TRACH 4X4 STRL 2S (GAUZE/BANDAGES/DRESSINGS) ×3 IMPLANT
STAPLER SKIN PROX 35W (STAPLE) ×3 IMPLANT
STOCKINETTE BIAS CUT 6 980064 (GAUZE/BANDAGES/DRESSINGS) ×2 IMPLANT
STOCKINETTE IMPERV 14X48 (MISCELLANEOUS) IMPLANT
STRAP TIBIA SHORT (MISCELLANEOUS) ×3 IMPLANT
SUCTION FRAZIER HANDLE 10FR (MISCELLANEOUS) ×4
SUCTION TUBE FRAZIER 10FR DISP (MISCELLANEOUS) ×1 IMPLANT
SUT VIC AB 0 CT1 36 (SUTURE) ×6 IMPLANT
SUT VIC AB 1 CT1 36 (SUTURE) ×6 IMPLANT
SUT VIC AB 2-0 CT2 27 (SUTURE) ×3 IMPLANT
SYR 20ML LL LF (SYRINGE) ×3 IMPLANT
SYR 30ML LL (SYRINGE) ×6 IMPLANT
TIBIA MBT CEMENT SIZE 4 (Knees) ×3 IMPLANT
TIP FAN IRRIG PULSAVAC PLUS (DISPOSABLE) ×1 IMPLANT
TOWEL OR 17X26 4PK STRL BLUE (TOWEL DISPOSABLE) ×3 IMPLANT
TOWER CARTRIDGE SMART MIX (DISPOSABLE) ×3 IMPLANT
TRAY FOLEY MTR SLVR 16FR STAT (SET/KITS/TRAYS/PACK) ×3 IMPLANT
WRAPON POLAR PAD KNEE (MISCELLANEOUS) ×3

## 2019-08-21 NOTE — Progress Notes (Signed)
PT Cancellation Note  Patient Details Name: Russell Rivas MRN: [id number] DOB: [birth year]   Cancelled Treatment:    Reason Eval/Treat Not Completed: Other (comment): Pt found with BLE sensation to light touch grossly intact but was unable to actively DF or PF either ankle.  Will attempt to see pt at a future date/time as medically appropriate.      Linus Salmons PT, DPT 08/21/19, 3:51 PM

## 2019-08-21 NOTE — Transfer of Care (Signed)
Immediate Anesthesia Transfer of Care Note  Patient: Russell Rivas  Procedure(s) Performed: COMPUTER ASSISTED TOTAL KNEE ARTHROPLASTY (Right Knee)  Patient Location: PACU  Anesthesia Type:Spinal  Level of Consciousness: awake, alert  and oriented  Airway & Oxygen Therapy: Patient Spontanous Breathing and Patient connected to nasal cannula oxygen  Post-op Assessment: Report given to RN and Post -op Vital signs reviewed and stable  Post vital signs: Reviewed and stable  Last Vitals:  Vitals Value Taken Time  BP    Temp    Pulse    Resp    SpO2      Last Pain:  Vitals:   08/21/19 0604  TempSrc: Tympanic  PainSc: 6          Complications: No apparent anesthesia complications

## 2019-08-21 NOTE — Progress Notes (Signed)
PT Cancellation Note  Patient Details Name: KAHRON BEETS MRN: [id number] DOB: Sep 08, 1955   Cancelled Treatment:    Reason Eval/Treat Not Completed: Other (comment): Pt with sensation to BLEs grossly intact and able to wiggle toes but continued to be unable to actively DF or PF either ankle or move either LE proximal to the ankle.  Will attempt to see pt at a future date/time as medically appropriate.    Linus Salmons PT, DPT 08/21/19, 4:41 PM

## 2019-08-21 NOTE — Op Note (Signed)
OPERATIVE NOTE  DATE OF SURGERY:  08/21/2019  PATIENT NAME:  Russell Rivas   DOB: [birth year]  MRN: [id number]  PRE-OPERATIVE DIAGNOSIS: Degenerative arthrosis of the right knee, primary  POST-OPERATIVE DIAGNOSIS:  Same  PROCEDURE:  Right total knee arthroplasty using computer-assisted navigation  SURGEON:  Marciano Sequin. M.D.  ASSISTANT: Cassell Smiles, PA-C (present and scrubbed throughout the case, critical for assistance with exposure, retraction, instrumentation, and closure)  ANESTHESIA: spinal  ESTIMATED BLOOD LOSS: 50 mL  FLUIDS REPLACED: 1200 mL of crystalloid  TOURNIQUET TIME: 150 minutes  DRAINS: 2 medium Hemovac drains  SOFT TISSUE RELEASES: Anterior cruciate ligament, posterior cruciate ligament, deep medial collateral ligament, patellofemoral ligament  IMPLANTS UTILIZED: DePuy PFC Sigma size 4 posterior stabilized femoral component (cemented), size 4 MBT tibial component (cemented), 38 mm 3 peg oval dome patella (cemented), and a 10 mm stabilized rotating platform polyethylene insert.  INDICATIONS FOR SURGERY: Russell Rivas is a 64 y.o. year old male with a long history of progressive knee pain. X-rays demonstrated severe degenerative changes in tricompartmental fashion. The patient had not seen any significant improvement despite conservative nonsurgical intervention. After discussion of the risks and benefits of surgical intervention, the patient expressed understanding of the risks benefits and agree with plans for total knee arthroplasty.   The risks, benefits, and alternatives were discussed at length including but not limited to the risks of infection, bleeding, nerve injury, stiffness, blood clots, the need for revision surgery, cardiopulmonary complications, among others, and they were willing to proceed.  PROCEDURE IN DETAIL: The patient was brought into the operating room and, after adequate spinal anesthesia was achieved, a tourniquet was placed on the  patient's upper thigh. The patient's knee and leg were cleaned and prepped with alcohol and DuraPrep and draped in the usual sterile fashion. A "timeout" was performed as per usual protocol. The lower extremity was exsanguinated using an Esmarch, and the tourniquet was inflated to 300 mmHg. An anterior longitudinal incision was made followed by a standard mid vastus approach. The deep fibers of the medial collateral ligament were elevated in a subperiosteal fashion off of the medial flare of the tibia so as to maintain a continuous soft tissue sleeve. The patella was subluxed laterally and the patellofemoral ligament was incised. Inspection of the knee demonstrated severe degenerative changes with full-thickness loss of articular cartilage. Osteophytes were debrided using a rongeur. Anterior and posterior cruciate ligaments were excised. Two 4.0 mm Schanz pins were inserted in the femur and into the tibia for attachment of the array of trackers used for computer-assisted navigation. Hip center was identified using a circumduction technique. Distal landmarks were mapped using the computer. The distal femur and proximal tibia were mapped using the computer. The distal femoral cutting guide was positioned using computer-assisted navigation so as to achieve a 5 distal valgus cut. The femur was sized and it was felt that a size 4 femoral component was appropriate. A size 4 femoral cutting guide was positioned and the anterior cut was performed and verified using the computer. This was followed by completion of the posterior and chamfer cuts. Femoral cutting guide for the central box was then positioned in the center box cut was performed.  Attention was then directed to the proximal tibia. Medial and lateral menisci were excised. The extramedullary tibial cutting guide was positioned using computer-assisted navigation so as to achieve a 0 varus-valgus alignment and 0 posterior slope. The cut was performed and  verified using the computer. The proximal  tibia was sized and it was felt that a size 4 tibial tray was appropriate. Tibial and femoral trials were inserted followed by insertion of a 10 mm polyethylene insert. This allowed for excellent mediolateral soft tissue balancing both in flexion and in full extension. Finally, the patella was cut and prepared so as to accommodate a 38 mm 3 peg oval dome patella. A patella trial was placed and the knee was placed through a range of motion with excellent patellar tracking appreciated. The femoral trial was removed after debridement of posterior osteophytes. The central post-hole for the tibial component was reamed followed by insertion of a keel punch. Tibial trials were then removed. Cut surfaces of bone were irrigated with copious amounts of normal saline with antibiotic solution using pulsatile lavage and then suctioned dry. Polymethylmethacrylate cement with gentamicin was prepared in the usual fashion using a vacuum mixer. Cement was applied to the cut surface of the proximal tibia as well as along the undersurface of a size 4 MBT tibial component. Tibial component was positioned and impacted into place. Excess cement was removed using [employer] fast streamer. Cement was then applied to the cut surfaces of the femur as well as along the posterior flanges of the size 4 femoral component. The femoral component was positioned and impacted into place. Excess cement was removed using [employer] fast streamer. A 10 mm polyethylene trial was inserted and the knee was brought into full extension with steady axial compression applied. Finally, cement was applied to the backside of a 38 mm 3 peg oval dome patella and the patellar component was positioned and patellar clamp applied. Excess cement was removed using [employer] fast streamer. After adequate curing of the cement, the tourniquet was deflated after a total tourniquet time of 150 minutes. Hemostasis was achieved using electrocautery. The knee was  irrigated with copious amounts of normal saline with antibiotic solution using pulsatile lavage and then suctioned dry. 20 mL of 1.3% Exparel and 60 mL of 0.25% Marcaine in 40 mL of normal saline was injected along the posterior capsule, medial and lateral gutters, and along the arthrotomy site. A 10 mm stabilized rotating platform polyethylene insert was inserted and the knee was placed through a range of motion with excellent mediolateral soft tissue balancing appreciated and excellent patellar tracking noted. 2 medium drains were placed in the wound bed and brought out through separate stab incisions. The medial parapatellar portion of the incision was reapproximated using interrupted sutures of #1 Vicryl. Subcutaneous tissue was approximated in layers using first #0 Vicryl followed #2-0 Vicryl. The skin was approximated with skin staples. A sterile dressing was applied.  The patient tolerated the procedure well and was transported to the recovery room in stable condition.    Anagabriela Jokerst P. Holley Bouche., M.D.

## 2019-08-21 NOTE — H&P (Signed)
The patient has been re-examined, and the chart reviewed, and there have been no interval changes to the documented history and physical.    The risks, benefits, and alternatives have been discussed at length. The patient expressed understanding of the risks benefits and agreed with plans for surgical intervention.  Brixon Zhen P. Katelin Kutsch, Jr. M.D.    

## 2019-08-21 NOTE — Anesthesia Preprocedure Evaluation (Signed)
Anesthesia Evaluation  Patient identified by MRN, date of birth, ID band Patient awake    Reviewed: Allergy & Precautions, H&P , NPO status , Patient's Chart, lab work & pertinent test results  History of Anesthesia Complications Negative for: history of anesthetic complications  Airway Mallampati: III  TM Distance: >3 FB Neck ROM: limited    Dental  (+) Chipped, Poor Dentition, Missing, Edentulous Upper   Pulmonary neg shortness of breath, COPD, Current Smoker and Patient abstained from smoking.,           Cardiovascular Exercise Tolerance: Good hypertension, (-) angina+ CAD  (-) DOE      Neuro/Psych PSYCHIATRIC DISORDERS TIA Neuromuscular disease    GI/Hepatic Neg liver ROS, GERD  Medicated and Controlled,  Endo/Other  Hypothyroidism   Renal/GU      Musculoskeletal  (+) Arthritis ,   Abdominal   Peds  Hematology negative hematology ROS (+)   Anesthesia Other Findings Past Medical History: No date: Alcohol abuse     Comment:  pt reports drinking at least one pint everyday.  No date: Anxiety No date: Arthritis No date: COPD (chronic obstructive pulmonary disease) (HCC)     Comment:  NO inhalers No date: Depression No date: GERD (gastroesophageal reflux disease) No date: Hypertension No date: Hypothyroidism     Comment:  does not take medication at this time.  has in the past No date: Polio     Comment:  as a child, caused poblems in right knee.  Past Surgical History: No date: APPENDECTOMY 10/02/2015: CARDIAC CATHETERIZATION; Left     Comment:  Procedure: Left Heart Cath and Coronary Angiography;                Surgeon: Dionisio David, MD;  Location: [place] [facility]               [facility];  Service: Cardiovascular;  Laterality: Left; 12/08/2015: COLONOSCOPY WITH PROPOFOL; N/A     Comment:  Procedure: COLONOSCOPY WITH PROPOFOL;  Surgeon: Lucilla Lame, MD;  Location: [facility];  Service:  Endoscopy;              Laterality: N/A; 09/02/2015: KNEE ARTHROSCOPY; Right     Comment:  Procedure: ARTHROSCOPY KNEE, debridement, microfracture;              Surgeon: Leanor Kail, MD;  Location: [facility];                Service: Orthopedics;  Laterality: Right; 11/26/2014: KNEE ARTHROSCOPY WITH MEDIAL MENISECTOMY; Right     Comment:  Procedure: KNEE ARTHROSCOPY WITH MEDIAL MENISECTOMY;                Surgeon: Leanor Kail, MD;  Location: [facility] OR[facility];                Service: Orthopedics;  Laterality: Right; 1959: KNEE LIGAMENT RECONSTRUCTION; Right     Comment:  pt did not have ligaments, ligaments were made and               implanted. 1969: KNEE SURGERY; Left     Comment:  growth bone removed. No date: SHOULDER ARTHROSCOPY; Bilateral     Comment:  one in January and one in June  BMI    Body Mass Index: 31.79 kg/m      Reproductive/Obstetrics negative OB ROS  Anesthesia Physical Anesthesia Plan  ASA: III  Anesthesia Plan: Spinal   Post-op Pain Management:    Induction:   PONV Risk Score and Plan:   Airway Management Planned: Natural Airway and Nasal Cannula  Additional Equipment:   Intra-op Plan:   Post-operative Plan:   Informed Consent: I have reviewed the patients History and Physical, chart, labs and discussed the procedure including the risks, benefits and alternatives for the proposed anesthesia with the patient or authorized representative who has indicated his/her understanding and acceptance.     Dental Advisory Given  Plan Discussed with: Anesthesiologist, CRNA and Surgeon  Anesthesia Plan Comments: (Patient reports no bleeding problems and no anticoagulant use.  Plan for spinal with backup GA  Patient consented for risks of anesthesia including but not limited to:  - adverse reactions to medications - risk of bleeding, infection, nerve damage and headache - risk of failed spinal - damage  to teeth, lips or other oral mucosa - sore throat or hoarseness - Damage to heart, brain, lungs or loss of life  Patient voiced understanding.)        Anesthesia Quick Evaluation

## 2019-08-21 NOTE — Anesthesia Procedure Notes (Addendum)
Spinal  Patient location during procedure: OR Start time: 08/21/2019 7:23 AM Staffing Performed: resident/CRNA  Anesthesiologist: Yarethzy Croak, Precious Haws, MD Resident/CRNA: Disser, Einar Grad, CRNA Preanesthetic Checklist Completed: patient identified, IV checked, site marked, risks and benefits discussed, surgical consent, monitors and equipment checked, pre-op evaluation and timeout performed Spinal Block Patient position: sitting Prep: ChloraPrep Patient monitoring: heart rate, cardiac monitor, continuous pulse ox and blood pressure Approach: midline Location: L3-4 Injection technique: single-shot Needle Needle type: Sprotte  Needle gauge: 24 G Needle length: 9 cm Assessment Sensory level: T10 Additional Notes Negative paresthesia. Negative blood return. Positive free-flowing CSF. Expiration date of kit checked and confirmed. Patient tolerated procedure well, without complications.

## 2019-08-22 MED ORDER — OXYCODONE HCL 5 MG PO TABS: 5.0000 mg | ORAL_TABLET | ORAL | 0 refills | Status: DC | PRN

## 2019-08-22 MED ORDER — TRAMADOL HCL 50 MG PO TABS: 50.0000 mg | ORAL_TABLET | ORAL | 0 refills | Status: DC | PRN

## 2019-08-22 MED ORDER — BUPIVACAINE HCL (PF) 0.5 % IJ SOLN
INTRAMUSCULAR | Status: DC | PRN
  Administered 2019-08-21: 3 mL via INTRATHECAL

## 2019-08-22 MED ORDER — CELECOXIB 200 MG PO CAPS: 200.0000 mg | ORAL_CAPSULE | Freq: Two times a day (BID) | ORAL | 0 refills | Status: DC

## 2019-08-22 MED ORDER — ENOXAPARIN SODIUM 40 MG/0.4ML ~~LOC~~ SOLN: 40.0000 mg | SUBCUTANEOUS | 0 refills | Status: DC

## 2019-08-22 NOTE — Progress Notes (Signed)
  Subjective: 1 Day Post-Op Procedure(s) (LRB): COMPUTER ASSISTED TOTAL KNEE ARTHROPLASTY (Right) Patient reports pain as well-controlled.   Patient is well, and has had no acute complaints or problems Plan is to go Home after hospital stay. Negative for chest pain and shortness of breath Fever: no Gastrointestinal: negative for nausea and vomiting.  Patient has had a bowel movement.  Objective: Vital signs in last 24 hours: Temp:  [97.7 F (36.5 C)-98 F (36.7 C)] 97.7 F (36.5 C) (02/11 1534) Pulse Rate:  [79-106] 81 (02/11 1534) Resp:  [16-20] 18 (02/11 0744) BP: (121-153)/(74-95) 153/95 (02/11 1534) SpO2:  [94 %-97 %] 97 % (02/11 1534)  Intake/Output from previous day:  Intake/Output Summary (Last 24 hours) at 08/22/2019 1727 Last data filed at 08/22/2019 1237 Gross per 24 hour  Intake 1420.2 ml  Output 2250.5 ml  Net -830.3 ml    Intake/Output this shift: Total I/O In: -  Out: 455 [Urine:375; Drains:80]  Labs: No results for input(s): HGB in the last 72 hours. No results for input(s): WBC, RBC, HCT, PLT in the last 72 hours. No results for input(s): NA, K, CL, CO2, BUN, CREATININE, GLUCOSE, CALCIUM in the last 72 hours. No results for input(s): LABPT, INR in the last 72 hours.   EXAM General - Patient is Alert, Appropriate and Oriented Extremity - Neurovascular intact Compartment soft Dressing/Incision -Postoperative dressing remains in place., Polar Care in place and working. , Hemovac in place.  Following postoperative dressing removal, sanguinous drainage noted over the distal half of the honeycomb dressing. Motor Function - intact, moving foot and toes well on exam.  Cardiovascular- Regular rate and rhythm, no murmurs/rubs/gallops Respiratory- Lungs clear to auscultation bilaterally Gastrointestinal- soft, nontender and active bowel sounds   Assessment/Plan: 1 Day Post-Op Procedure(s) (LRB): COMPUTER ASSISTED TOTAL KNEE ARTHROPLASTY (Right) Active  Problems:   Total knee replacement status  Estimated body mass index is 31.79 kg/m as calculated from the following:   Height as of this encounter: 5' 6.5" (1.689 m).   Weight as of this encounter: 90.7 kg. Discharge home with home health  Hemovac and postoperative bandage removed.  Fresh honeycomb dressing applied.  DVT Prophylaxis - Lovenox Weight-Bearing as tolerated to right leg  Cassell Smiles, PA-C [facility] Orthopaedic Surgery 08/22/2019, 5:27 PM

## 2019-08-22 NOTE — Progress Notes (Signed)
Occupational Therapy Evaluation Patient Details Name: Russell Rivas MRN: [id number] DOB: Jun 23, 1956 Today's Date: 08/22/2019    History of Present Illness The patient is a 63 y.o. male s/p right total knee arthroplasty 08/21/2019.  PTA he tried to continue with his exercise routine which previously included power lifting and he was not using any ambulatory aids. Of note, the patient had polio as a child.  He had multiple surgical procedures to the lower extremities.   Clinical Impression  Russell Rivas was seen for OT evaluation this date. Prior to hospital admission, pt was Modified Independent with ADLs (pt used shower chair and handheld shower hose for bathing) and was not driving 2/2 R knee pain. Pt lives in a single level home c his wife who is available 24/7. Currently pt demonstrates impairments as described below (See OT problem list) which functionally limit his ability to perform ADL/self-care tasks. Pt currently requires CGA and MIN multimodal cues to complete grooming and LBD activities. Pt instructed in polar care mgt, falls prevention strategies, home/routines modifications, DME/AE for LB bathing and dressing tasks, and compression stocking mgt. Pt would benefit from skilled OT to address noted impairments and functional limitations (see below for any additional details) in order to maximize safety and independence while minimizing falls risk and caregiver burden. Do not currently anticipate any OT needs following this hospitalization.    Follow Up Recommendations  No OT follow up    Equipment Recommendations  None recommended by OT    Recommendations for Other Services       Precautions / Restrictions Precautions Precautions: Fall Restrictions Weight Bearing Restrictions: Yes RLE Weight Bearing: Weight bearing as tolerated      Mobility Bed Mobility Overal bed mobility: Modified Independent             General bed mobility comments: MOD I sup>sit c HOB  ~45*  Transfers Overall transfer level: Needs assistance Equipment used: Rolling walker (2 wheeled) Transfers: Sit to/from Stand Sit to Stand: Min guard         General transfer comment: CGA sit<>stand at EOB and EOC. Pt demonstrated impulsivty as he was quick to stand when asked to remain seated. CGA + RW in room functional mobility.     Balance Overall balance assessment: Needs assistance Sitting-balance support: No upper extremity supported;Feet supported Sitting balance-Leahy Scale: Good     Standing balance support: Single extremity supported;During functional activity Standing balance-Leahy Scale: Good                             ADL either performed or assessed with clinical judgement   ADL Overall ADL's : Needs assistance/impaired                                       General ADL Comments: CGA + LUE support toothbrushing standing sinkside. CGA + MIN VCs for technique donning boxer shorts at [place]. Pt educated on use of RW and reacher to assist c LBD to prevent falls. PTA pt used shower chair for bathing, anticipate close SUPERVISION for bathing seated in shower.       Vision Baseline Vision/History: Wears glasses Wears Glasses: At all times Patient Visual Report: No change from baseline       Perception     Praxis      Pertinent Vitals/Pain Pain Assessment: 0-10 Pain Score: 8  Pain Location: RLE Pain Descriptors / Indicators: Discomfort Pain Intervention(s): Limited activity within patient's tolerance;Repositioned     Hand Dominance Right   Extremity/Trunk Assessment Upper Extremity Assessment Upper Extremity Assessment: Overall WFL for tasks assessed   Lower Extremity Assessment Lower Extremity Assessment: Overall WFL for tasks assessed   Cervical / Trunk Assessment Cervical / Trunk Assessment: Normal   Communication Communication Communication: No difficulties   Cognition Arousal/Alertness: Awake/alert Behavior  During Therapy: WFL for tasks assessed/performed;Impulsive Overall Cognitive Status: Within Functional Limits for tasks assessed                                 General Comments: Pt required MIN multimodal cues to prevent impulsive movements t/o session   General Comments  Surgical site not visualized    Exercises Exercises: Other exercises Other Exercises Other Exercises: Pt educated re: falls prevention, polar care, AE, WBAT status, RW technique, safe transfer technique, mobility as pain management technique Other Exercises: Toothbrushing sinkside, LBD while managing polar care/linesstanding/sitting balalnce/tolerance   Shoulder Instructions      Home Living Family/patient expects to be discharged to:: Private residence Living Arrangements: Spouse/significant other(wife) Available Help at Discharge: Family;Available 24 hours/day;Neighbor;Available PRN/intermittently Type of Home: House Home Access: Stairs to enter [employer] of Steps: 5 Entrance Stairs-Rails: Left;Right;Can reach both Home Layout: One level     Bathroom Shower/Tub: Teacher, early years/pre: Standard Bathroom Accessibility: Yes How Accessible: Accessible via walker Home Equipment: Shower seat;Bedside commode   Additional Comments: Pt has a cat at home      Prior Functioning/Environment Level of Independence: Independent with assistive device(s)        Comments: Active and Independent but limited by knee, back, and shoulder pain. Independent for ADL, sits to shower/dress. Part time Curator.        OT Problem List: Decreased knowledge of use of DME or AE;Pain;Decreased range of motion;Decreased strength      OT Treatment/Interventions: Self-care/ADL training;Therapeutic exercise;Energy conservation;DME and/or AE instruction;Therapeutic activities;Patient/family education;Balance training    OT Goals(Current goals can be found in the care plan section) Acute Rehab  OT Goals Patient Stated Goal: To be able to go on walks with his grandson OT Goal Formulation: With patient Time For Goal Achievement: 09/05/19 Potential to Achieve Goals: Good  OT Frequency: Min 2X/week   Barriers to D/C: Decreased caregiver support          Co-evaluation              AM-PAC OT "6 Clicks" Daily Activity     Outcome Measure Help from another person eating meals?: None Help from another person taking care of personal grooming?: None Help from another person toileting, which includes using toliet, bedpan, or urinal?: A Little Help from another person bathing (including washing, rinsing, drying)?: A Little Help from another person to put on and taking off regular upper body clothing?: None Help from another person to put on and taking off regular lower body clothing?: A Little 6 Click Score: 21   End of Session Equipment Utilized During Treatment: Gait belt;Rolling walker  Activity Tolerance: Patient tolerated treatment well Patient left: in chair;with call bell/phone within reach;with SCD's reapplied  OT Visit Diagnosis: Other abnormalities of gait and mobility (R26.89);Pain Pain - Right/Left: Right Pain - part of body: Knee                Time: [id number] OT Time Calculation (min): 29  min Charges:  OT General Charges $OT Visit: 1 Visit OT Evaluation $OT Eval Moderate Complexity: 1 Mod OT Treatments $Self Care/Home Management : 23-37 mins  Dessie Coma, M.S. OTR/L  08/22/19, 10:21 AM

## 2019-08-22 NOTE — TOC Transition Note (Signed)
Transition of Care [facility]) - Progression Note    Patient Details  Name: Russell Rivas MRN: [id number] Date of Birth: 08/24/1955  Transition of Care [facility]) CM/SW [place], RN Phone Number: 08/22/2019, 3:25 PM  Clinical Narrative:     Met with the patient and notified him that the Lovenox is covered and there is no copay, he stated that he is all set up and [facility] is coming out to the house tomorrow and he will be going home today, the RW is in the room he has no other needs  Expected Discharge Plan: [place] Barriers to Discharge: Continued Medical Work up  Expected Discharge Plan and Services Expected Discharge Plan: [place]   Discharge Planning Services: CM Consult   Living arrangements for the past 2 months: Single Family Home                 DME Arranged: Gilford Rile rolling DME Agency: [employer] Date DME Agency Contacted: 08/22/19 Time DME Agency Contacted: [phone] Representative spoke with at DME Agency: [place]: PT [place]: [facility] [employer]) Date [place]: 08/22/19 Time [place]: 86 Representative spoke with at [place]: [place] ([place]) Interventions    Readmission Risk Interventions No flowsheet data found.

## 2019-08-22 NOTE — Plan of Care (Signed)

## 2019-08-22 NOTE — Progress Notes (Signed)
D: Pt alert and oriented x 4.   A: Pt  received discharge and medication education/information. Pt belongings were gathered and taken with pt to include discharge papers, incentive spirometer, polar care, walker, and bone foam.  R: Pt verbalized understanding of discharge and medication education/information.  Pt escorted to medical mall front lobby by staff via wheelchair where spouse picked them up.

## 2019-08-22 NOTE — Evaluation (Signed)
Physical Therapy Evaluation Patient Details Name: Russell Rivas MRN: [id number] DOB: 06-02-1956 Today's Date: 08/22/2019   History of Present Illness  Per MD notes: Patient is a 64 y.o. male s/p elective right total knee arthroplasty. PMH of polio as a child and multiple surgical procedures to the lower extremities.    Clinical Impression  Pt pleasant and motivated to participate during the session. Pt was very high level for POD#1 following TKA. Pt was able to ambulate around the nursing station twice and had no major difficulty with stair training. Pt was very responsive to feedback regarding his RW use and amb strategies and successfully made all changes that were recommended. Pt was found on RA and HR and SPO02 were WNL throughout the session. Pt will benefit from HHPT services upon discharge to safely address deficits listed in patient problem list for decreased caregiver assistance and eventual return to PLOF.        Follow Up Recommendations Home health PT    Equipment Recommendations  Rolling walker with 5" wheels    Recommendations for Other Services       Precautions / Restrictions Precautions Precautions: Fall;Knee Precaution Booklet Issued: Yes (comment) Required Braces or Orthoses: Knee Immobilizer - Right Knee Immobilizer - Right: Discontinue once straight leg raise with < 10 degree lag Restrictions Weight Bearing Restrictions: Yes RLE Weight Bearing: Weight bearing as tolerated Other Position/Activity Restrictions: Pt was able to complete a SLR with <10 deg knee leg and was not given a knee immoblizer      Mobility  Bed Mobility Overal bed mobility: Modified Independent             General bed mobility comments: takes extra time  Transfers Overall transfer level: Needs assistance Equipment used: Rolling walker (2 wheeled) Transfers: Sit to/from Stand Sit to Stand: Min guard         General transfer comment: Pt demonstrated impulsivty as he was  quick to stand when he saw PT enter his room  Ambulation/Gait Ambulation/Gait assistance: Min guard Gait Distance (Feet): 400 Feet Assistive device: Rolling walker (2 wheeled) Gait Pattern/deviations: Step-through pattern     General Gait Details: decreased  Stairs Stairs: Yes Stairs assistance: Min guard Stair Management: Two rails;Step to pattern;Forwards Number of Stairs: 4 General stair comments: Good eccentric/concentric control with good stability  Wheelchair Mobility    Modified Rankin (Stroke Patients Only)       Balance Overall balance assessment: Needs assistance Sitting-balance support: No upper extremity supported;Feet supported Sitting balance-Leahy Scale: Good     Standing balance support: During functional activity;Bilateral upper extremity supported Standing balance-Leahy Scale: Good                               Pertinent Vitals/Pain Pain Assessment: 0-10 Pain Score: 7  Pain Location: RLE Pain Descriptors / Indicators: Aching;Sore Pain Intervention(s): Monitored during session;Premedicated before session    [place] expects to be discharged to:: Private residence Living Arrangements: Spouse/significant other Available Help at Discharge: Family;Available PRN/intermittently Type of Home: House Home Access: Stairs to enter Entrance Stairs-Rails: Left;Right;Can reach both Entrance Stairs-Number of Steps: 5 Home Layout: One level Home Equipment: Shower seat;Bedside commode;Walker - 4 wheels;Hand held shower head;Wheelchair - manual     Prior Function Level of Independence: Independent         Comments: Community amb, ind with ADL's     Hand Dominance       Extremity/Trunk Assessment  Lower Extremity Assessment Lower Extremity Assessment: LLE deficits/detail LLE Deficits / Details: s/p R TKA LLE Sensation: WNL      Communication     Cognition Arousal/Alertness: Awake/alert Behavior During  Therapy: WFL for tasks assessed/performed;Impulsive Overall Cognitive Status: Within Functional Limits for tasks assessed                                 General Comments: Pt required cueing to prevent from transfering/ambulating before lines/leads and therapist was ready.      General Comments    Exercises Total Joint Exercises Ankle Circles/Pumps: AROM;Strengthening;Both;10 reps Quad Sets: Strengthening;Both;10 reps Gluteal Sets: Strengthening;Both;10 reps Straight Leg Raises: AROM;Strengthening;Right;5 reps Long Arc Quad: AROM;Strengthening;Right;10 reps Knee Flexion: AROM;Strengthening;Right;10 reps Goniometric ROM: 10-90 deg Marching in Standing: AROM;Strengthening;Both;10 reps Other Exercises Other Exercises: Pt educated on WBAT status, RW technique, potential inc in pain following amb Other Exercises: stair training   Assessment/Plan    PT Assessment Patient needs continued PT services  PT Problem List Decreased strength;Decreased mobility;Decreased safety awareness;Decreased range of motion;Decreased knowledge of precautions;Decreased activity tolerance;Decreased balance;Decreased knowledge of use of DME;Pain       PT Treatment Interventions DME instruction;Therapeutic exercise;Gait training;Stair training;Balance training;Functional mobility training;Therapeutic activities    PT Goals (Current goals can be found in the Care Plan section)  Acute Rehab PT Goals Patient Stated Goal: inc endurance and strength PT Goal Formulation: With patient Time For Goal Achievement: 09/04/19 Potential to Achieve Goals: Good    Frequency BID   Barriers to discharge        Co-evaluation               AM-PAC PT "6 Clicks" Mobility  Outcome Measure Help needed turning from your back to your side while in a flat bed without using bedrails?: A Little Help needed moving from lying on your back to sitting on the side of a flat bed without using bedrails?: A  Little Help needed moving to and from a bed to a chair (including a wheelchair)?: A Little Help needed standing up from a chair using your arms (e.g., wheelchair or bedside chair)?: A Little Help needed to walk in hospital room?: A Little Help needed climbing 3-5 steps with a railing? : A Little 6 Click Score: 18    End of Session Equipment Utilized During Treatment: Gait belt Activity Tolerance: Patient tolerated treatment well Patient left: in chair;with call bell/phone within reach;with chair alarm set;with SCD's reapplied Nurse Communication: Mobility status;Weight bearing status PT Visit Diagnosis: Other abnormalities of gait and mobility (R26.89);Pain Pain - Right/Left: Right Pain - part of body: Knee    Time: [id number] PT Time Calculation (min) (ACUTE ONLY): 43 min   Charges:              Annabelle Harman, SPT 08/22/19 1:52 PM

## 2019-08-22 NOTE — Discharge Summary (Signed)
Physician Discharge Summary  Patient ID: Russell Rivas MRN: [id number] DOB/AGE: [birth year] 64 y.o.  Admit date: 08/21/2019 Discharge date: 08/22/2019  Admission Diagnoses:  Total knee replacement status [Z96.659]  Surgeries: Right total knee arthroplasty using computer-assisted navigation  SURGEON:  Marciano Sequin. M.D.  ASSISTANT: Cassell Smiles, PA-C (present and scrubbed throughout the case, critical for assistance with exposure, retraction, instrumentation, and closure)  ANESTHESIA: spinal  ESTIMATED BLOOD LOSS: 50 mL  FLUIDS REPLACED: 1200 mL of crystalloid  TOURNIQUET TIME: 150 minutes  DRAINS: 2 medium Hemovac drains  SOFT TISSUE RELEASES: Anterior cruciate ligament, posterior cruciate ligament, deep medial collateral ligament, patellofemoral ligament  IMPLANTS UTILIZED: DePuy PFC Sigma size 4 posterior stabilized femoral component (cemented), size 4 MBT tibial component (cemented), 38 mm 3 peg oval dome patella (cemented), and a 10 mm stabilized rotating platform polyethylene insert. Discharge Diagnoses: Patient Active Problem List   Diagnosis Date Noted  . Total knee replacement status 08/21/2019  . Status post left rotator cuff repair 11/30/2018  . Depression, prolonged 08/28/2018  . Hypothyroidism 08/28/2018  . TIA (transient ischemic attack) 08/20/2018  . COPD mixed type ([place]) 04/26/2017  . Coronary artery disease, non-occlusive 04/26/2017  . Tobacco abuse 04/26/2017  . Primary osteoarthritis of right knee 05/30/2016  . Blood in stool   . Rectal polyp   . First degree hemorrhoids   . Increased MCV 06/17/2015  . Current tear of meniscus 12/05/2014  . 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 Lumbar radiculopathy 04/15/2013  . Complete rotator cuff rupture of left shoulder 09/04/2012    Past Medical History:  Diagnosis Date  . Alcohol abuse    pt reports drinking at least one pint everyday.   . Anxiety   . Arthritis   . COPD (chronic  obstructive pulmonary disease) (HCC)    NO inhalers  . Depression   . GERD (gastroesophageal reflux disease)   . Hypertension   . Hypothyroidism    does not take medication at this time.  has in the past  . Polio    as a child, caused poblems in right knee.     Transfusion: n/a   Consultants (if any):   Discharged Condition: Improved  Hospital Course: Russell Rivas is an 64 y.o. male who was admitted 08/21/2019 with a diagnosis of right knee osteoarthritis and went to the operating room on 08/21/2019 and underwent right total knee arthroplasty. The patient received perioperative antibiotics for prophylaxis (see below). The patient tolerated the procedure well and was transported to PACU in stable condition. After meeting PACU criteria, the patient was subsequently transferred to the Orthopaedics/Rehabilitation unit.   The patient received DVT prophylaxis in the form of early mobilization, Lovenox, Foot Pumps and TED hose. A sacral pad had been placed and heels were elevated off of the bed with rolled towels in order to protect skin integrity. Foley catheter was discontinued on postoperative day #1. Wound drains were discontinued on postoperative day #1. The surgical incision was healing well without signs of infection.  Physical therapy was initiated postoperatively for transfers, gait training, and strengthening. Occupational therapy was initiated for activities of daily living and evaluation for assisted devices. Rehabilitation goals were reviewed in detail with the patient. The patient made steady progress with physical therapy and physical therapy recommended discharge to Home.   The patient achieved his preliminary goals of this hospitalization and was felt to be medically and orthopaedically appropriate for discharge.  He was given perioperative antibiotics:  Anti-infectives (From admission, onward)  Start     Dose/Rate Route Frequency Ordered Stop   08/21/19 1355  clindamycin  (CLEOCIN) IVPB 600 mg     600 mg 100 mL/hr over 30 Minutes Intravenous Every 6 hours 08/21/19 1355 08/22/19 0838   08/21/19 0617  clindamycin (CLEOCIN) 900 MG/50ML IVPB    Note to Pharmacy: Norton Blizzard  : cabinet override      08/21/19 0617 08/21/19 0741   08/21/19 0600  clindamycin (CLEOCIN) IVPB 900 mg  Status:  Discontinued     900 mg 100 mL/hr over 30 Minutes Intravenous On call to O.R. 08/20/19 2223 08/21/19 0549    .  Recent vital signs:  Vitals:   08/22/19 1028 08/22/19 1534  BP: (!) 147/89 (!) 153/95  Pulse: 88 81  Resp:    Temp:  97.7 F (36.5 C)  SpO2: 95% 97%    Recent laboratory studies:  No results for input(s): WBC, HGB, HCT, PLT, K, CL, CO2, BUN, CREATININE, GLUCOSE, CALCIUM, LABPT, INR in the last 72 hours.  Diagnostic Studies: DG Knee Right Port  Result Date: 08/21/2019 CLINICAL DATA:  Status post right knee replacement EXAM: PORTABLE RIGHT KNEE - 1-2 VIEW COMPARISON:  07/13/2018 FINDINGS: Right knee replacement is noted in satisfactory position. Surgical drains are seen in place. No soft tissue abnormality is noted. IMPRESSION: Status post right knee replacement. Electronically Signed   By: Inez Catalina M.D.   On: 08/21/2019 12:37    Discharge Medications:   Allergies as of 08/22/2019      Reactions   Hydrocodone Itching   Penicillins Nausea And Vomiting, Rash   Did it involve swelling of the face/tongue/throat, SOB, or low BP? No Did it involve sudden or severe rash/hives, skin peeling, or any reaction on the inside of your mouth or nose? No Did you need to seek medical attention at a hospital or doctor's office? No When did it last happen?10 + years If all above answers are "NO", may proceed with cephalosporin use.      Medication List    STOP taking these medications   aspirin EC 81 MG tablet     TAKE these medications   acetaminophen 500 MG tablet Commonly known as: TYLENOL Take 500 mg by mouth every 6 (six) hours as needed for  moderate pain or headache.   albuterol 108 (90 Base) MCG/ACT inhaler Commonly known as: VENTOLIN HFA Inhale 2 puffs into the lungs 2 (two) times daily.   celecoxib 200 MG capsule Commonly known as: CELEBREX Take 1 capsule (200 mg total) by mouth 2 (two) times daily.   Dexilant 60 MG capsule Generic drug: dexlansoprazole Take 60 mg by mouth daily.   enoxaparin 40 MG/0.4ML injection Commonly known as: LOVENOX Inject 0.4 mLs (40 mg total) into the skin daily for 14 days.   lisinopril 10 MG tablet Commonly known as: ZESTRIL Take 1 tablet by mouth daily.   magnesium oxide 400 MG tablet Commonly known as: MAG-OX Take 800 mg by mouth 2 (two) times daily.   multivitamin with minerals tablet Take 1 tablet by mouth daily.   oxyCODONE 5 MG immediate release tablet Commonly known as: Oxy IR/ROXICODONE Take 1 tablet (5 mg total) by mouth every 4 (four) hours as needed for moderate pain (pain score 4-6).   traMADol 50 MG tablet Commonly known as: ULTRAM Take 1 tablet (50 mg total) by mouth every 4 (four) hours as needed for moderate pain.            Durable Medical Equipment  (  From admission, onward)         Start     Ordered   08/21/19 1356  DME Walker rolling  Once    Question:  Patient needs a walker to treat with the following condition  Answer:  Total knee replacement status   08/21/19 1355   08/21/19 1356  DME Bedside commode  Once    Question:  Patient needs a bedside commode to treat with the following condition  Answer:  Total knee replacement status   08/21/19 1355          Disposition: Home    Follow-up Information    [facility] On 09/05/2019.   Specialty: Orthopedic Surgery Why: at 9:15am Contact information: [place] [address] [phone]        Dereck Leep, MD On 10/03/2019.   Specialty: Orthopedic Surgery Why: at 9:45am Contact  information: [place] [address] [place], PA-C 08/22/2019, 5:35 PM

## 2019-08-22 NOTE — Progress Notes (Signed)
  Subjective: 1 Day Post-Op Procedure(s) (LRB): COMPUTER ASSISTED TOTAL KNEE ARTHROPLASTY (Right) Patient reports pain as well-controlled.   Patient is well, and has had no acute complaints or problems Plan is to go Home after hospital stay. Negative for chest pain and shortness of breath Fever: no Gastrointestinal: negative for nausea and vomiting.  Patient has had a bowel movement.  Patient asks about leaving early if he can complete his PT goals.   Objective: Vital signs in last 24 hours: Temp:  [97 F (36.1 C)-98 F (36.7 C)] 97.7 F (36.5 C) (02/11 0744) Pulse Rate:  [66-107] 79 (02/11 0744) Resp:  [12-20] 18 (02/11 0744) BP: (111-145)/(62-93) 145/80 (02/11 0744) SpO2:  [94 %-98 %] 95 % (02/11 0744) FiO2 (%):  [21 %] 21 % (02/10 1356)  Intake/Output from previous day:  Intake/Output Summary (Last 24 hours) at 08/22/2019 0749 Last data filed at 08/22/2019 0634 Gross per 24 hour  Intake 3190.97 ml  Output 2920.5 ml  Net 270.47 ml    Intake/Output this shift: No intake/output data recorded.  Labs: No results for input(s): HGB in the last 72 hours. No results for input(s): WBC, RBC, HCT, PLT in the last 72 hours. No results for input(s): NA, K, CL, CO2, BUN, CREATININE, GLUCOSE, CALCIUM in the last 72 hours. No results for input(s): LABPT, INR in the last 72 hours.   EXAM General - Patient is Alert, Appropriate and Oriented Extremity - Neurovascular intact Dorsiflexion/Plantar flexion intact Compartment soft Dressing/Incision -Postoperative dressing remains in place., Polar Care in place and working. , Hemovac in place.  Motor Function - intact, moving foot and toes well on exam. Able to perform SLR Cardiovascular- Regular rate and rhythm, no murmurs/rubs/gallops Respiratory- Lungs clear to auscultation bilaterally Gastrointestinal- soft, nontender and active bowel sounds   Assessment/Plan: 1 Day Post-Op Procedure(s) (LRB): COMPUTER ASSISTED TOTAL KNEE  ARTHROPLASTY (Right) Active Problems:   Total knee replacement status  Estimated body mass index is 31.79 kg/m as calculated from the following:   Height as of this encounter: 5' 6.5" (1.689 m).   Weight as of this encounter: 90.7 kg. Advance diet Up with therapy Plan for discharge tomorrow  Patient may d/c early if he completes PT goals ahead of schedule today. I will check on patient this evening to verify completion of goals.   DVT Prophylaxis - Lovenox, Ted hose and foot pumps Weight-Bearing as tolerated to right leg  Cassell Smiles, PA-C [facility] Orthopaedic Surgery 08/22/2019, 7:49 AM

## 2019-08-22 NOTE — Progress Notes (Signed)
Patient dangled on the side of the bed. Tolerated well.

## 2019-08-22 NOTE — Progress Notes (Signed)
Physical Therapy Treatment Patient Details Name: Russell Rivas MRN: [id number] DOB: 19-Dec-1955 Today's Date: 08/22/2019    History of Present Illness Per MD notes: Patient is a 64 y.o. male s/p elective right total knee arthroplasty. PMH of polio as a child and multiple surgical procedures to the lower extremities.    PT Comments    Pt pleasant and motivated to participate during the session. Pt has been very excited to work with PT and can be impulsive by initiating transfers and ambulation before lines/leads are secured, gait belt is placed, or therapist is in position to assist. Pt able to ambulate >200 feet, go up an down 4 stairs without visual or verbal cueing, and used RW correctly all demonstrating good carryover from the morning session.  Car transfer training was verbally reviewed and pt and his wife verbalized understanding. Pt will benefit from HHPT services upon discharge to safely address deficits listed in patient problem list for decreased caregiver assistance and eventual return to PLOF.       Follow Up Recommendations  Home health PT     Equipment Recommendations  Rolling walker with 5" wheels    Recommendations for Other Services       Precautions / Restrictions Precautions Precautions: Fall;Knee Precaution Booklet Issued: Yes (comment) Required Braces or Orthoses: Knee Immobilizer - Right Knee Immobilizer - Right: Discontinue once straight leg raise with < 10 degree lag Restrictions Weight Bearing Restrictions: Yes RLE Weight Bearing: Weight bearing as tolerated Other Position/Activity Restrictions: Pt was able to complete a SLR with <10 deg knee leg and was not given a knee immoblizer    Mobility  Bed Mobility Overal bed mobility: Modified Independent             General bed mobility comments: takes extra time  Transfers Overall transfer level: Modified Ind Equipment used: Rolling walker (2 wheeled) Transfers: Sit to/from Stand Sit to Stand:  Modified independent (Device/Increase time)         General transfer comment: Pt demonstrated impulsivty as he was quick to stand when he saw PT enter his room  Ambulation/Gait Ambulation/Gait assistance: Min guard Gait Distance (Feet): 250 Feet Assistive device: Rolling walker (2 wheeled) Gait Pattern/deviations: Step-through pattern     General Gait Details: decreased   Stairs Stairs: Yes Stairs assistance: Min guard Stair Management: Two rails;Step to pattern;Forwards Number of Stairs: 4 General stair comments: Good eccentric/concentric control with good stability   Wheelchair Mobility    Modified Rankin (Stroke Patients Only)       Balance Overall balance assessment: No apparent balance deficits (not formally assessed) Sitting-balance support: No upper extremity supported;Feet unsupported Sitting balance-Leahy Scale: Good     Standing balance support: During functional activity;Bilateral upper extremity supported Standing balance-Leahy Scale: Good                              Cognition Arousal/Alertness: Awake/alert Behavior During Therapy: WFL for tasks assessed/performed;Impulsive Overall Cognitive Status: Within Functional Limits for tasks assessed                                 General Comments: Pt required cueing to prevent from transfering/ambulating before lines/leads and therapist was ready.      Exercises Total Joint Exercises Ankle Circles/Pumps: AROM;Strengthening;Both;10 reps Quad Sets: Strengthening;Both;10 reps Long Arc Quad: AROM;Strengthening;Right;10 reps Knee Flexion: AROM;Strengthening;Right;10 reps Other Exercises Other Exercises: Pt educated  on WBAT status, RW technique, potential inc in pain following amb Other Exercises: stair training Other Exercises: verbally and visually demonstrated car transfer training Other Exercises: reviewed HEP    General Comments        Pertinent Vitals/Pain Pain  Assessment: 0-10 Pain Score: 8  Pain Location: RLE Pain Descriptors / Indicators: Aching;Sore Pain Intervention(s): Monitored during session;Premedicated before session    [place] expects to be discharged to:: Private residence Living Arrangements: Spouse/significant other Available Help at Discharge: Family;Available PRN/intermittently Type of Home: House Home Access: Stairs to enter Entrance Stairs-Rails: Left;Right;Can reach both Home Layout: One level Home Equipment: Shower seat;Bedside commode;Walker - 4 wheels;Hand held shower head;Wheelchair - manual      Prior Function Level of Independence: Independent      Comments: Community amb, ind with ADL's   PT Goals (current goals can now be found in the care plan section) Acute Rehab PT Goals Patient Stated Goal: inc endurance and strength PT Goal Formulation: With patient Time For Goal Achievement: 09/04/19 Potential to Achieve Goals: Good Progress towards PT goals: Progressing toward goals    Frequency    BID      PT Plan Current plan remains appropriate    Co-evaluation              AM-PAC PT "6 Clicks" Mobility   Outcome Measure  Help needed turning from your back to your side while in a flat bed without using bedrails?: A Little Help needed moving from lying on your back to sitting on the side of a flat bed without using bedrails?: A Little Help needed moving to and from a bed to a chair (including a wheelchair)?: A Little Help needed standing up from a chair using your arms (e.g., wheelchair or bedside chair)?: A Little Help needed to walk in hospital room?: A Little Help needed climbing 3-5 steps with a railing? : A Little 6 Click Score: 18    End of Session Equipment Utilized During Treatment: Gait belt Activity Tolerance: Patient tolerated treatment well Patient left: in chair;with call bell/phone within reach;with SCD's reapplied Nurse Communication: Mobility status;Weight  bearing status PT Visit Diagnosis: Other abnormalities of gait and mobility (R26.89);Pain Pain - Right/Left: Right Pain - part of body: Knee     Time: 1402-1430 PT Time Calculation (min) (ACUTE ONLY): 28 min  Charges:  $Gait Training: 8-22 mins $Therapeutic Exercise: 8-22 mins                     Annabelle Harman, SPT 08/22/19 3:51 PM

## 2019-08-22 NOTE — TOC Initial Note (Signed)
Transition of Care [facility]) - Initial/Assessment Note    Patient Details  Name: Russell Rivas MRN: [id number] Date of Birth: 04/11/1956  [facility]) CM/SW Contact:    Su Hilt, RN Phone Number: 08/22/2019, 1:59 PM  Clinical Narrative:                 Met with the patient and his wife in the room to discuss DC plan and needs The patient stated that he is seperated and lives alone however his wife will be helping him He needs a RW, I notified Brad with [employer] of the need He is aware of the need for Lovenox I have requested the price of it  Expected Discharge Plan: Home w Home Health Services Barriers to Discharge: Continued Medical Work up   Patient Goals and CMS Choice Patient states their goals for this hospitalization and ongoing recovery are:: go home      Expected Discharge Plan and Services Expected Discharge Plan: [place]   Discharge Planning Services: CM Consult   Living arrangements for the past 2 months: Single Family Home                 DME Arranged: Walker rolling DME Agency: [employer] Date DME Agency Contacted: 08/22/19 Time DME Agency Contacted: [phone] Representative spoke with at DME Agency: [place]: PT [place]: [facility] [employer] Date [place]: 08/22/19 Time [place]: 9 Representative spoke with at [place]: Helene Kelp  Prior Living Arrangements/Services Living arrangements for the past 2 months: [place] with:: Self(wife helps tjhey are seperated) Patient language and need for interpreter reviewed:: Yes Do you feel safe going back to the place where you live?: Yes      Need for Family Participation in Patient Care: No (Comment) Care giver support system in place?: Yes (comment)   Criminal Activity/Legal Involvement Pertinent to Current Situation/Hospitalization: No - Comment as needed  Activities of Daily Living Home Assistive  Devices/Equipment: Eyeglasses, Dentures (specify type) ADL Screening (condition at time of admission) Patient's cognitive ability adequate to safely complete daily activities?: Yes Is the patient deaf or have difficulty hearing?: No Does the patient have difficulty seeing, even when wearing glasses/contacts?: No Does the patient have difficulty concentrating, remembering, or making decisions?: No Patient able to express need for assistance with ADLs?: Yes Does the patient have difficulty dressing or bathing?: No Independently performs ADLs?: Yes (appropriate for developmental age) Does the patient have difficulty walking or climbing stairs?: Yes Weakness of Legs: None Weakness of Arms/Hands: None  Permission Sought/Granted   Permission granted to share information with : Yes, Verbal Permission Granted              Emotional Assessment Appearance:: Appears stated age Attitude/Demeanor/Rapport: Engaged Affect (typically observed): Appropriate Orientation: : Oriented to Self, Oriented to Situation, Oriented to Place, Oriented to  Time Alcohol / Substance Use: Not Applicable Psych Involvement: No (comment)  Admission diagnosis:  Total knee replacement status [Z96.659] Patient Active Problem List   Diagnosis Date Noted  . Total knee replacement status 08/21/2019  . Status post left rotator cuff repair 11/30/2018  . Depression, prolonged 08/28/2018  . Hypothyroidism 08/28/2018  . TIA (transient ischemic attack) 08/20/2018  . COPD mixed type ([place]) 04/26/2017  . Coronary artery disease, non-occlusive 04/26/2017  . Tobacco abuse 04/26/2017  . Primary osteoarthritis of right knee 05/30/2016  . Blood in stool   . Rectal polyp   .  First degree hemorrhoids   . Increased MCV 06/17/2015  . Current tear of meniscus 12/05/2014  . 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 Lumbar radiculopathy 04/15/2013  . Complete rotator cuff rupture of left shoulder 09/04/2012   PCP:  Baxter Hire, MD Pharmacy:   [facility], [place] [address] Phone: [phone] Fax: [place], [place] - [address] [place] [address] Phone: [phone] Fax: [phone]  [place], [place] - [address] [place] [address] Phone: [phone] Fax: [phone]     Social Determinants of Health (SDOH) Interventions    Readmission Risk Interventions No flowsheet data found.

## 2019-08-22 NOTE — Anesthesia Postprocedure Evaluation (Signed)
Anesthesia Post Note  Patient: Russell Rivas  Procedure(s) Performed: COMPUTER ASSISTED TOTAL KNEE ARTHROPLASTY (Right Knee)  Patient location during evaluation: Nursing Unit Anesthesia Type: Spinal Level of consciousness: oriented and awake and alert Pain management: pain level controlled Vital Signs Assessment: post-procedure vital signs reviewed and stable Respiratory status: spontaneous breathing and respiratory function stable Cardiovascular status: blood pressure returned to baseline and stable Postop Assessment: no headache, no backache, no apparent nausea or vomiting and patient able to bend at knees Anesthetic complications: no     Last Vitals:  Vitals:   08/21/19 2017 08/22/19 0358  BP:  140/80  Pulse:  84  Resp:  18  Temp:  36.7 C  SpO2: 95%     Last Pain:  Vitals:   08/22/19 0536  TempSrc:   PainSc: 7                  Brantley Fling

## 2019-08-22 NOTE — TOC Benefit Eligibility Note (Signed)
Transition of Care [facility]) Benefit Eligibility Note    Patient Details  Name: Russell Rivas MRN: [id number] Date of Birth: 1956-03-07   Medication/Dose: Enoxaparin 40 mg daily x 14 days  Covered?: Yes(Generic - Enoxaparin)     Prescription Coverage Preferred Pharmacy: [facility] - [phone]  Spoke with Person/Company/Phone Number:: Wyatt Haste [place], [phone]     Prior Approval: No  Deductible: (No deductible)  Additional Notes: Lovenox not on formulary    [facility] Phone Number: 08/22/2019, 3:04 PM

## 2019-08-22 NOTE — Plan of Care (Signed)
  Problem: Education: Goal: Individualized Educational Video(s) Outcome: Progressing   Problem: Clinical Measurements: Goal: Postoperative complications will be avoided or minimized Outcome: Progressing   Problem: Pain Management: Goal: Pain level will decrease with appropriate interventions Outcome: Progressing   Problem: Skin Integrity: Goal: Will show signs of wound healing Outcome: Progressing   Problem: Education: Goal: Knowledge of the prescribed therapeutic regimen will improve Outcome: Progressing Goal: Individualized Educational Video(s) Outcome: Progressing

## 2019-09-10 ENCOUNTER — Ambulatory Visit: Payer: Medicare Other | Attending: Orthopedic Surgery

## 2019-09-10 ENCOUNTER — Other Ambulatory Visit: Payer: Self-pay

## 2019-09-10 DIAGNOSIS — M25561 Pain in right knee: Secondary | ICD-10-CM | POA: Insufficient documentation

## 2019-09-10 DIAGNOSIS — R262 Difficulty in walking, not elsewhere classified: Secondary | ICD-10-CM | POA: Diagnosis present

## 2019-09-10 DIAGNOSIS — M25661 Stiffness of right knee, not elsewhere classified: Secondary | ICD-10-CM

## 2019-09-10 NOTE — Addendum Note (Signed)
Addended by: Blain Pais on: 09/10/2019 01:01 PM   Modules accepted: Orders

## 2019-09-10 NOTE — Therapy (Signed)
[facility] [address], [place], [address] Phone: [phone]   Fax:  [phone]  Physical Therapy Evaluation  Patient Details  Name: Russell Rivas MRN: [id number] Date of Birth: [birth year] Referring Provider (PT): Tamala Julian   Encounter Date: 09/10/2019  PT End of Session - 09/10/19 1013    Visit Number  1    Number of Visits  13    Date for PT Re-Evaluation  10/22/19    Authorization Type  1 / 10    PT Start Time  0845    PT Stop Time  0945    PT Time Calculation (min)  60 min    Activity Tolerance  Patient tolerated treatment well    Behavior During Therapy  [facility] for tasks assessed/performed       Past Medical History:  Diagnosis Date  . Alcohol abuse    pt reports drinking at least one pint everyday.   . Anxiety   . Arthritis   . COPD (chronic obstructive pulmonary disease) (HCC)    NO inhalers  . Depression   . GERD (gastroesophageal reflux disease)   . Hypertension   . Hypothyroidism    does not take medication at this time.  has in the past  . Polio    as a child, caused poblems in right knee.    Past Surgical History:  Procedure Laterality Date  . APPENDECTOMY    . CARDIAC CATHETERIZATION Left 10/02/2015   Procedure: Left Heart Cath and Coronary Angiography;  Surgeon: Dionisio David, MD;  Location: [facility];  Service: Cardiovascular;  Laterality: Left;  . COLONOSCOPY WITH PROPOFOL N/A 12/08/2015   Procedure: COLONOSCOPY WITH PROPOFOL;  Surgeon: Lucilla Lame, MD;  Location: [facility];  Service: Endoscopy;  Laterality: N/A;  . KNEE ARTHROPLASTY Right 08/21/2019   Procedure: COMPUTER ASSISTED TOTAL KNEE ARTHROPLASTY;  Surgeon: Dereck Leep, MD;  Location: [facility];  Service: Orthopedics;  Laterality: Right;  . KNEE ARTHROSCOPY Right 09/02/2015   Procedure: ARTHROSCOPY KNEE, debridement, microfracture;  Surgeon: Leanor Kail, MD;  Location: [facility];  Service:  Orthopedics;  Laterality: Right;  . KNEE ARTHROSCOPY WITH MEDIAL MENISECTOMY Right 11/26/2014   Procedure: KNEE ARTHROSCOPY WITH MEDIAL MENISECTOMY;  Surgeon: Leanor Kail, MD;  Location: [facility];  Service: Orthopedics;  Laterality: Right;  . KNEE LIGAMENT RECONSTRUCTION Right 1959   pt did not have ligaments, ligaments were made and implanted.  Marland Kitchen KNEE SURGERY Left 1969   growth bone removed.  Marland Kitchen SHOULDER ARTHROSCOPY Bilateral    one in January and one in June    There were no vitals filed for this visit.   Subjective Assessment - 09/10/19 1004    Subjective  Patient reports he had a R TKA on 09/18/2019 S/p experiencing years of knee pain which limited his ability to squat, bend, and walk for prolonged periods of time. Patient reports he performs resistance based exercises often, going to the gym on average 1x per day. Patient states since the surgery he's been having increased difficulties with performing walking, ascending/desending the stairs, and bending the knee past 90 degrees. Patient states he has been improving slowly with more range of motion in the knee, but continues to have increased pain with the knee remaining in any position for a prolonged period of time. Patient states he has been performing exercises since he had home-health therapy since the the surgery. Patient reports he would like to return to the gym  and decrease the knee pain overall.    Pertinent History  Polio as a child, HTN, Hx of ETH abuse    Limitations  Lifting;Standing;Walking    Diagnostic tests  X-Ray healing as normal    Patient Stated Goals  To have the knee feel better, return to the gym    Currently in Pain?  Yes   worst: 8/10; best: 4/10   Pain Score  6     Pain Location  Knee    Pain Orientation  Right    Pain Descriptors / Indicators  Aching    Pain Type  Surgical pain    Pain Onset  1 to 4 weeks ago    Pain Frequency  Intermittent         [facility] PT Assessment - 09/10/19 1028       Assessment   Medical Diagnosis  R TKA    Referring Provider (PT)  Tamala Julian    Onset Date/Surgical Date  08/21/19    Hand Dominance  Right    Next MD Visit  unknown    Prior Therapy  yes - home health      Balance Screen   Has the patient fallen in the past 6 months  No    Has the patient had a decrease in activity level because of a fear of falling?   Yes    Is the patient reluctant to leave their home because of a fear of falling?   No      Home Film/video editor residence    Living Arrangements  Spouse/significant other    Available Help at Discharge  Family    Type of [place] to enter    Entrance Stairs-Number of Steps  4    Entrance Stairs-Rails  Can reach both    [place]  One level    [place] - 2 wheels;Cane - single point      Prior Function   Level of Independence  Independent    Vocation  Unemployed    Vocation Requirements  N/A    Leisure  Resistance based exercise      Cognition   Overall Cognitive Status  Within Functional Limits for tasks assessed      Observation/Other Assessments   Observations  Increased lumbar flexion in standing wide stance hip ER B      Sensation   Light Touch  Appears Intact      Functional Tests   Functional tests  Squat      Squat   Comments  Decreased AROM - can't go past 90, good form overall      Posture/Postural Control   Posture Comments  Forwardly flexed      ROM / Strength   AROM / PROM / Strength  AROM;Strength      AROM   AROM Assessment Site  Knee;Hip;Ankle    Right/Left Hip  Right;Left    Right Hip Extension  0    Right Hip Flexion  120    Right Hip External Rotation   25    Right Hip Internal Rotation   25    Right Hip ABduction  30    Right Hip ADduction  30    Left Hip Extension  0    Left Hip Flexion  120    Left Hip External Rotation   25    Left Hip Internal Rotation  25    Left Hip ABduction  30    Left Hip ADduction  30     Right/Left Knee  Right;Left    Right Knee Extension  -8    Right Knee Flexion  100    Left Knee Extension  0    Left Knee Flexion  130    Right/Left Ankle  Left;Right    Right Ankle Dorsiflexion  10    Left Ankle Dorsiflexion  10      Strength   Strength Assessment Site  Hip;Knee;Ankle    Right/Left Hip  Right;Left    Right Hip Flexion  5/5    Right Hip Extension  4/5    Right Hip External Rotation   4+/5    Right Hip Internal Rotation  4/5    Right Hip ABduction  4+/5    Right Hip ADduction  4+/5    Left Hip Flexion  5/5    Left Hip Extension  4/5    Left Hip External Rotation  4+/5    Left Hip Internal Rotation  4/5    Left Hip ABduction  4+/5    Left Hip ADduction  4+/5    Right/Left Knee  Right;Left    Right Knee Flexion  4+/5    Right Knee Extension  4+/5    Left Knee Flexion  5/5    Left Knee Extension  5/5    Right/Left Ankle  Right;Left    Right Ankle Dorsiflexion  5/5    Left Ankle Dorsiflexion  5/5      Palpation   Patella mobility  Good mobility; decreased inferior mobs    Palpation comment  TTP over quadriceps and hamstrings      Special Tests    Special Tests  Knee Special Tests    Knee Special tests   Step-up/Step Down Test      Step-up/Step Down    Findings  Positive    Side   Right    Comments  Unable to perform      Ambulation/Gait   Gait Comments  Decreased stride length, wide BOS, increased hip ER with walking      Objective measurements completed on examination: See above findings.   TREATMENT Therapeutic Exercise Seated bike with seat level 10 -- x 5 min TKE in standing with GTB around knee -- x 15 Squats in standing -- x 10 without UE support  Performed exercises in standing to address knee flexion and extension mobilization    PT Education - 09/10/19 1011    Education Details  TKE with GTB, [employer], bike, POC    Person(s) Educated  Patient    Methods  Explanation;Demonstration    Comprehension  Verbalized  understanding;Returned demonstration       PT Short Term Goals - 09/10/19 1019      PT SHORT TERM GOAL #1   Title  Patient will be independent with HEP to continue benefits of therapy after discharge.    Baseline  Dependent with HEP form and progression    Time  6    Period  Weeks    Status  New    Target Date  10/01/19        PT Long Term Goals - 09/10/19 1021      PT LONG TERM GOAL #1   Title  Patient will have a worst pain of 2/10 to improve comfortability with walking and moving throughout the house    Baseline  8/10 worst pain  Time  6    Period  Weeks    Status  New    Target Date  10/22/19      PT LONG TERM GOAL #2   Title  Patient will improve knee extension to 0 and knee flexion to 130 on the R Knee to improve ability to perform recreational resistance based exercises.    Baseline  -8, 100 degrees    Time  6    Period  Weeks    Status  New    Target Date  10/22/19      PT LONG TERM GOAL #3   Title  Patient will be able to asend and desend the stairs without increased in pain along the knee.    Baseline  Increased pain with both directions most notably with desending.    Time  6    Period  Weeks    Target Date  10/22/19      PT LONG TERM GOAL #4   Title  Patient will improve his FOTO score to indicating significant functional improvement and use of his R Knee.    Time  6    Period  Weeks    Status  New    Target Date  10/22/19             Plan - 09/10/19 1014    Clinical Impression Statement  Patient is a 64 yo right hand dominant male presenting with increased R knee pain and dysfunction s/p TKA on 08/21/2019. Patient demonstrates major limitations in R knee extension and flexion (-8, 100 degrees respectfully), Minor limitations in hip strength, and limitations in functional measures such as walking and desending the stairs with step over step. Patient will benefit from improving these limitations to return to prior level of function.    Personal  Factors and Comorbidities  Comorbidity 2    Comorbidities  HTN, ETH abuse,    Examination-Activity Limitations  Bed Mobility;Lift;Squat;Stairs;Stand    Examination-Participation Restrictions  Community Activity    Stability/Clinical Decision Making  Stable/Uncomplicated    Clinical Decision Making  Low    Rehab Potential  Good    PT Frequency  2x / week    PT Duration  6 weeks    PT Treatment/Interventions  Electrical Stimulation;Iontophoresis 4mg /ml Dexamethasone;Cryotherapy;Ultrasound;Moist Heat;Functional mobility training;Stair training;Gait training;DME Instruction;Therapeutic activities;Therapeutic exercise;Balance training;Neuromuscular re-education;Manual techniques;Patient/family education;Scar mobilization;Dry needling;Energy conservation    PT Next Visit Plan  Progress Knee flexion and extension exercises    PT Home Exercise Plan  See education section    Consulted and Agree with Plan of Care  Patient       Patient will benefit from skilled therapeutic intervention in order to improve the following deficits and impairments:  Abnormal gait, Decreased activity tolerance, Decreased balance, Decreased endurance, Decreased range of motion, Decreased coordination, Decreased mobility, Hypermobility, Decreased strength, Hypomobility, Difficulty walking, Impaired flexibility, Increased muscle spasms, Postural dysfunction, Pain  Visit Diagnosis: Acute pain of right knee  Difficulty in walking, not elsewhere classified  Stiffness of right knee, not elsewhere classified     Problem List Patient Active Problem List   Diagnosis Date Noted  . Total knee replacement status 08/21/2019  . Status post left rotator cuff repair 11/30/2018  . Depression, prolonged 08/28/2018  . Hypothyroidism 08/28/2018  . TIA (transient ischemic attack) 08/20/2018  . COPD mixed type ([place]) 04/26/2017  . Coronary artery disease, non-occlusive 04/26/2017  . Tobacco abuse 04/26/2017  . Primary osteoarthritis  of right knee 05/30/2016  . Blood in  stool   . Rectal polyp   . First degree hemorrhoids   . Increased MCV 06/17/2015  . Current tear of meniscus 12/05/2014  . 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 Lumbar radiculopathy 04/15/2013  . Complete rotator cuff rupture of left shoulder 09/04/2012    Russell Rivas, PT DPT 09/10/2019, 12:43 PM  [facility] [address] [place], [address] Phone: [phone]   Fax:  [phone]  Name: Russell Rivas MRN: [id number] Date of Birth: 25-Jul-1955

## 2019-09-12 ENCOUNTER — Other Ambulatory Visit: Payer: Self-pay

## 2019-09-12 ENCOUNTER — Ambulatory Visit: Payer: Medicare Other

## 2019-09-12 DIAGNOSIS — R262 Difficulty in walking, not elsewhere classified: Secondary | ICD-10-CM

## 2019-09-12 DIAGNOSIS — M25661 Stiffness of right knee, not elsewhere classified: Secondary | ICD-10-CM

## 2019-09-12 DIAGNOSIS — M25561 Pain in right knee: Secondary | ICD-10-CM

## 2019-09-12 NOTE — Therapy (Signed)
[facility] [address], [place], [address] Phone: [phone]   Fax:  [phone]  Physical Therapy Treatment  Patient Details  Name: Russell Rivas MRN: [id number] Date of Birth: 07-17-1955 Referring Provider (PT): Tamala Julian   Encounter Date: 09/12/2019  PT End of Session - 09/12/19 0921    Visit Number  2    Number of Visits  13    Date for PT Re-Evaluation  10/22/19    Authorization Type  2 / 10    PT Start Time  0900    PT Stop Time  0945    PT Time Calculation (min)  45 min    Activity Tolerance  Patient tolerated treatment well    Behavior During Therapy  [facility] for tasks assessed/performed       Past Medical History:  Diagnosis Date  . Alcohol abuse    pt reports drinking at least one pint everyday.   . Anxiety   . Arthritis   . COPD (chronic obstructive pulmonary disease) (HCC)    NO inhalers  . Depression   . GERD (gastroesophageal reflux disease)   . Hypertension   . Hypothyroidism    does not take medication at this time.  has in the past  . Polio    as a child, caused poblems in right knee.    Past Surgical History:  Procedure Laterality Date  . APPENDECTOMY    . CARDIAC CATHETERIZATION Left 10/02/2015   Procedure: Left Heart Cath and Coronary Angiography;  Surgeon: Dionisio David, MD;  Location: [facility];  Service: Cardiovascular;  Laterality: Left;  . COLONOSCOPY WITH PROPOFOL N/A 12/08/2015   Procedure: COLONOSCOPY WITH PROPOFOL;  Surgeon: Lucilla Lame, MD;  Location: [facility];  Service: Endoscopy;  Laterality: N/A;  . KNEE ARTHROPLASTY Right 08/21/2019   Procedure: COMPUTER ASSISTED TOTAL KNEE ARTHROPLASTY;  Surgeon: Dereck Leep, MD;  Location: [facility];  Service: Orthopedics;  Laterality: Right;  . KNEE ARTHROSCOPY Right 09/02/2015   Procedure: ARTHROSCOPY KNEE, debridement, microfracture;  Surgeon: Leanor Kail, MD;  Location: [facility];  Service:  Orthopedics;  Laterality: Right;  . KNEE ARTHROSCOPY WITH MEDIAL MENISECTOMY Right 11/26/2014   Procedure: KNEE ARTHROSCOPY WITH MEDIAL MENISECTOMY;  Surgeon: Leanor Kail, MD;  Location: [facility];  Service: Orthopedics;  Laterality: Right;  . KNEE LIGAMENT RECONSTRUCTION Right 1959   pt did not have ligaments, ligaments were made and implanted.  Marland Kitchen KNEE SURGERY Left 1969   growth bone removed.  Marland Kitchen SHOULDER ARTHROSCOPY Bilateral    one in January and one in June    There were no vitals filed for this visit.  Subjective Assessment - 09/12/19 0917    Subjective  Patient states he feels like he can move his knee more since the previous session. Patient states overall improvement.    Pertinent History  Polio as a child, HTN, Hx of ETH abuse    Limitations  Lifting;Standing;Walking    Diagnostic tests  X-Ray healing as normal    Patient Stated Goals  To have the knee feel better, return to the gym    Currently in Pain?  Yes    Pain Score  6     Pain Location  Knee    Pain Orientation  Right    Pain Descriptors / Indicators  Aching    Pain Type  Surgical pain    Pain Onset  1 to 4 weeks ago    Pain Frequency  Intermittent       TREATMENT Therapeutic Exercise Biking with seat level 9 to 8 - 2.5 min in each position TKE in standing with GTB around the back of the knee - x 10  Step ups on 6" step in to a lunge - x 10 B Squats with UE support -- x 15  Hip extension in standing - x 15 SLS RDL with UE support - x 10 B Performed exercises to improve strength and decrease muscular spasms  Manual therapy: STM performed to the quadriceps and hamstrings with patient positioned in long sitting to decrease increased spasms and pain    PT Education - 09/12/19 0920    Education Details  Form/technique with exercise; Biking through greater AROM    Person(s) Educated  Patient    Methods  Explanation;Demonstration    Comprehension  Verbalized understanding;Returned demonstration       PT  Short Term Goals - 09/10/19 1019      PT SHORT TERM GOAL #1   Title  Patient will be independent with HEP to continue benefits of therapy after discharge.    Baseline  Dependent with HEP form and progression    Time  6    Period  Weeks    Status  New    Target Date  10/01/19        PT Long Term Goals - 09/10/19 1021      PT LONG TERM GOAL #1   Title  Patient will have a worst pain of 2/10 to improve comfortability with walking and moving throughout the house    Baseline  8/10 worst pain    Time  6    Period  Weeks    Status  New    Target Date  10/22/19      PT LONG TERM GOAL #2   Title  Patient will improve knee extension to 0 and knee flexion to 130 on the R Knee to improve ability to perform recreational resistance based exercises.    Baseline  -8, 100 degrees    Time  6    Period  Weeks    Status  New    Target Date  10/22/19      PT LONG TERM GOAL #3   Title  Patient will be able to asend and desend the stairs without increased in pain along the knee.    Baseline  Increased pain with both directions most notably with desending.    Time  6    Period  Weeks    Target Date  10/22/19      PT LONG TERM GOAL #4   Title  Patient will improve his FOTO score to indicating significant functional improvement and use of his R Knee.    Time  6    Period  Weeks    Status  New    Target Date  10/22/19            Plan - 09/12/19 [id number]    Clinical Impression Statement  Patient is making improvements overall with knee AROM with maintaining good technique and form throuhgout the session. Patient able to perform exercises with good technique but conitnues to have limitations in AROM. Patient able to achieve 110 in flexion -5 in extension by end of the session. Patient is improving overall and will beenfit from further skilled therapy to return to prior level of function.    Personal Factors and Comorbidities  Comorbidity 2    Comorbidities  HTN, ETH  abuse,     Examination-Activity Limitations  Bed Mobility;Lift;Squat;Stairs;Stand    Examination-Participation Restrictions  Community Activity    Stability/Clinical Decision Making  Stable/Uncomplicated    Rehab Potential  Good    PT Frequency  2x / week    PT Duration  6 weeks    PT Treatment/Interventions  Electrical Stimulation;Iontophoresis 4mg /ml Dexamethasone;Cryotherapy;Ultrasound;Moist Heat;Functional mobility training;Stair training;Gait training;DME Instruction;Therapeutic activities;Therapeutic exercise;Balance training;Neuromuscular re-education;Manual techniques;Patient/family education;Scar mobilization;Dry needling;Energy conservation    PT Next Visit Plan  Progress Knee flexion and extension exercises    PT Home Exercise Plan  See education section    Consulted and Agree with Plan of Care  Patient       Patient will benefit from skilled therapeutic intervention in order to improve the following deficits and impairments:  Abnormal gait, Decreased activity tolerance, Decreased balance, Decreased endurance, Decreased range of motion, Decreased coordination, Decreased mobility, Hypermobility, Decreased strength, Hypomobility, Difficulty walking, Impaired flexibility, Increased muscle spasms, Postural dysfunction, Pain  Visit Diagnosis: Acute pain of right knee  Difficulty in walking, not elsewhere classified  Stiffness of right knee, not elsewhere classified     Problem List Patient Active Problem List   Diagnosis Date Noted  . Total knee replacement status 08/21/2019  . Status post left rotator cuff repair 11/30/2018  . Depression, prolonged 08/28/2018  . Hypothyroidism 08/28/2018  . TIA (transient ischemic attack) 08/20/2018  . COPD mixed type ([place]) 04/26/2017  . Coronary artery disease, non-occlusive 04/26/2017  . Tobacco abuse 04/26/2017  . Primary osteoarthritis of right knee 05/30/2016  . Blood in stool   . Rectal polyp   . First degree hemorrhoids   . Increased MCV  06/17/2015  . Current tear of meniscus 12/05/2014  . 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 Lumbar radiculopathy 04/15/2013  . Complete rotator cuff rupture of left shoulder 09/04/2012    Blythe Stanford, PT DPT 09/12/2019, 1:16 PM  [facility] [address] [place], [address] Phone: [phone]   Fax:  [phone]  Name: Russell Rivas MRN: [id number] Date of Birth: 13-Dec-1955

## 2019-09-16 ENCOUNTER — Ambulatory Visit: Payer: Medicare Other

## 2019-09-18 ENCOUNTER — Ambulatory Visit: Payer: Medicare Other

## 2019-09-23 ENCOUNTER — Ambulatory Visit: Payer: Medicare Other

## 2019-09-26 ENCOUNTER — Ambulatory Visit: Payer: Medicare Other

## 2019-10-28 ENCOUNTER — Other Ambulatory Visit: Payer: Self-pay | Admitting: Orthopedic Surgery

## 2019-10-28 DIAGNOSIS — M25511 Pain in right shoulder: Secondary | ICD-10-CM

## 2020-01-09 ENCOUNTER — Emergency Department: Payer: Medicare Other

## 2020-01-09 ENCOUNTER — Other Ambulatory Visit: Payer: Self-pay

## 2020-01-09 ENCOUNTER — Emergency Department
Admission: EM | Admit: 2020-01-09 | Discharge: 2020-01-10 | Disposition: A | Discharge status: Home / Self Care | Payer: Medicare Other | Attending: Emergency Medicine | Admitting: Emergency Medicine

## 2020-01-09 DIAGNOSIS — I10 Essential (primary) hypertension: Secondary | ICD-10-CM | POA: Insufficient documentation

## 2020-01-09 DIAGNOSIS — Z96651 Presence of right artificial knee joint: Secondary | ICD-10-CM | POA: Insufficient documentation

## 2020-01-09 DIAGNOSIS — I498 Other specified cardiac arrhythmias: Secondary | ICD-10-CM | POA: Insufficient documentation

## 2020-01-09 DIAGNOSIS — J449 Chronic obstructive pulmonary disease, unspecified: Secondary | ICD-10-CM | POA: Diagnosis not present

## 2020-01-09 DIAGNOSIS — F17210 Nicotine dependence, cigarettes, uncomplicated: Secondary | ICD-10-CM | POA: Insufficient documentation

## 2020-01-09 DIAGNOSIS — E039 Hypothyroidism, unspecified: Secondary | ICD-10-CM | POA: Insufficient documentation

## 2020-01-09 DIAGNOSIS — R531 Weakness: Secondary | ICD-10-CM | POA: Insufficient documentation

## 2020-01-09 DIAGNOSIS — Z79899 Other long term (current) drug therapy: Secondary | ICD-10-CM | POA: Insufficient documentation

## 2020-01-09 DIAGNOSIS — I2510 Atherosclerotic heart disease of native coronary artery without angina pectoris: Secondary | ICD-10-CM | POA: Insufficient documentation

## 2020-01-09 DIAGNOSIS — R0789 Other chest pain: Secondary | ICD-10-CM

## 2020-01-09 DIAGNOSIS — F1010 Alcohol abuse, uncomplicated: Secondary | ICD-10-CM | POA: Insufficient documentation

## 2020-01-09 DIAGNOSIS — Z8673 Personal history of transient ischemic attack (TIA), and cerebral infarction without residual deficits: Secondary | ICD-10-CM | POA: Insufficient documentation

## 2020-01-09 DIAGNOSIS — K219 Gastro-esophageal reflux disease without esophagitis: Secondary | ICD-10-CM

## 2020-01-09 LAB — CBC WITH DIFFERENTIAL/PLATELET
Abs Immature Granulocytes: 0.01 10*3/uL (ref 0.00–0.07)
Basophils Absolute: 0 10*3/uL (ref 0.0–0.1)
Basophils Relative: 1 %
Eosinophils Absolute: 0.1 10*3/uL (ref 0.0–0.5)
Eosinophils Relative: 1 %
HCT: 40.4 % (ref 39.0–52.0)
Hemoglobin: 13.1 g/dL (ref 13.0–17.0)
Immature Granulocytes: 0 %
Lymphocytes Relative: 33 %
Lymphs Abs: 1.8 10*3/uL (ref 0.7–4.0)
MCH: 29.1 pg (ref 26.0–34.0)
MCHC: 32.4 g/dL (ref 30.0–36.0)
MCV: 89.8 fL (ref 80.0–100.0)
Monocytes Absolute: 0.5 10*3/uL (ref 0.1–1.0)
Monocytes Relative: 9 %
Neutro Abs: 3.1 10*3/uL (ref 1.7–7.7)
Neutrophils Relative %: 56 %
Platelets: 190 10*3/uL (ref 150–400)
RBC: 4.5 MIL/uL (ref 4.22–5.81)
RDW: 16.9 % — ABNORMAL HIGH (ref 11.5–15.5)
WBC: 5.6 10*3/uL (ref 4.0–10.5)
nRBC: 0 % (ref 0.0–0.2)

## 2020-01-09 LAB — HEPATIC FUNCTION PANEL
ALT: 15 U/L (ref 0–44)
AST: 27 U/L (ref 15–41)
Albumin: 3 g/dL — ABNORMAL LOW (ref 3.5–5.0)
Alkaline Phosphatase: 34 U/L — ABNORMAL LOW (ref 38–126)
Bilirubin, Direct: <0.1 mg/dL (ref 0.0–0.2)
Total Bilirubin: 0.6 mg/dL (ref 0.3–1.2)
Total Protein: 6 g/dL — ABNORMAL LOW (ref 6.5–8.1)

## 2020-01-09 LAB — BASIC METABOLIC PANEL
Anion gap: 12 (ref 5–15)
BUN: 8 mg/dL (ref 8–23)
CO2: 23 mmol/L (ref 22–32)
Calcium: 7.7 mg/dL — ABNORMAL LOW (ref 8.9–10.3)
Chloride: 106 mmol/L (ref 98–111)
Creatinine, Ser: 0.9 mg/dL (ref 0.61–1.24)
GFR calc Af Amer: >60 mL/min (ref >60)
GFR calc non Af Amer: >60 mL/min (ref >60)
Glucose, Bld: 105 mg/dL — ABNORMAL HIGH (ref 70–99)
Potassium: 3 mmol/L — ABNORMAL LOW (ref 3.5–5.1)
Sodium: 141 mmol/L (ref 135–145)

## 2020-01-09 LAB — TROPONIN I (HIGH SENSITIVITY): Troponin I (High Sensitivity): 9 ng/L (ref <18)

## 2020-01-09 LAB — ETHANOL: Alcohol, Ethyl (B): 171 mg/dL — ABNORMAL HIGH (ref <10)

## 2020-01-09 MED ORDER — SUCRALFATE 1 G PO TABS: 1.0000 g | ORAL_TABLET | Freq: Four times a day (QID) | ORAL | 0 refills | Status: DC

## 2020-01-09 MED ORDER — NITROGLYCERIN 0.4 MG SL SUBL
0.4000 mg | SUBLINGUAL_TABLET | Freq: Once | SUBLINGUAL | Status: AC
  Administered 2020-01-09: 0.4 mg via SUBLINGUAL
  Filled 2020-01-09: qty 1

## 2020-01-09 MED ORDER — LIDOCAINE VISCOUS HCL 2 % MT SOLN
15.0000 mL | Freq: Once | OROMUCOSAL | Status: AC
  Administered 2020-01-09: 15 mL via OROMUCOSAL
  Filled 2020-01-09: qty 15

## 2020-01-09 NOTE — ED Provider Notes (Signed)
[facility] Emergency Department Provider Note  ____________________________________________   I have reviewed the triage vital signs and the nursing notes.   HISTORY  Chief Complaint Weakness and Chest Pain   History limited by: Not Limited   HPI JEANPIERRE THEBEAU is a 64 y.o. male who presents to the emergency department today because of concerns for chest pressure.  The patient states that he has been having issues for the past 3 weeks.  He has noticed decreased exercise tolerance.  He states that he does workout regularly and can start feeling his weakness coming on.  He will sit until it passes.  Patient also states that he has noticed at work that he can no longer be quite as active as previously.  Patient states that his chest chest pressure is located in the left center chest.  There has been some radiation up into his jaw.  He states that a number of years ago he had a catheterization done was told he had a 40% blockage.  Does take aspirin daily.  Records reviewed. Per medical record review patient has a history of COPD, GERD.   Past Medical History:  Diagnosis Date  . Alcohol abuse    pt reports drinking at least one pint everyday.   . Anxiety   . Arthritis   . COPD (chronic obstructive pulmonary disease) (HCC)    NO inhalers  . Depression   . GERD (gastroesophageal reflux disease)   . Hypertension   . Hypothyroidism    does not take medication at this time.  has in the past  . Polio    as a child, caused poblems in right knee.    Patient Active Problem List   Diagnosis Date Noted  . Total knee replacement status 08/21/2019  . Status post left rotator cuff repair 11/30/2018  . Depression, prolonged 08/28/2018  . Hypothyroidism 08/28/2018  . TIA (transient ischemic attack) 08/20/2018  . COPD mixed type ([place]) 04/26/2017  . Coronary artery disease, non-occlusive 04/26/2017  . Tobacco abuse 04/26/2017  . Primary osteoarthritis of right knee  05/30/2016  . Blood in stool   . Rectal polyp   . First degree hemorrhoids   . Increased MCV 06/17/2015  . Current tear of meniscus 12/05/2014  . 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 Lumbar radiculopathy 04/15/2013  . Complete rotator cuff rupture of left shoulder 09/04/2012    Past Surgical History:  Procedure Laterality Date  . APPENDECTOMY    . CARDIAC CATHETERIZATION Left 10/02/2015   Procedure: Left Heart Cath and Coronary Angiography;  Surgeon: Dionisio David, MD;  Location: [facility];  Service: Cardiovascular;  Laterality: Left;  . COLONOSCOPY WITH PROPOFOL N/A 12/08/2015   Procedure: COLONOSCOPY WITH PROPOFOL;  Surgeon: Lucilla Lame, MD;  Location: [facility];  Service: Endoscopy;  Laterality: N/A;  . KNEE ARTHROPLASTY Right 08/21/2019   Procedure: COMPUTER ASSISTED TOTAL KNEE ARTHROPLASTY;  Surgeon: Dereck Leep, MD;  Location: [facility];  Service: Orthopedics;  Laterality: Right;  . KNEE ARTHROSCOPY Right 09/02/2015   Procedure: ARTHROSCOPY KNEE, debridement, microfracture;  Surgeon: Leanor Kail, MD;  Location: [facility];  Service: Orthopedics;  Laterality: Right;  . KNEE ARTHROSCOPY WITH MEDIAL MENISECTOMY Right 11/26/2014   Procedure: KNEE ARTHROSCOPY WITH MEDIAL MENISECTOMY;  Surgeon: Leanor Kail, MD;  Location: [facility];  Service: Orthopedics;  Laterality: Right;  . KNEE LIGAMENT RECONSTRUCTION Right 1959   pt did not have ligaments, ligaments were made and implanted.  Marland Kitchen Mayo  growth bone removed.  Marland Kitchen SHOULDER ARTHROSCOPY Bilateral    one in January and one in June    Prior to Admission medications   Medication Sig Start Date End Date Taking? Authorizing Provider  acetaminophen (TYLENOL) 500 MG tablet Take 500 mg by mouth every 6 (six) hours as needed for moderate pain or headache.    [provider]  albuterol (VENTOLIN HFA) 108 (90 Base) MCG/ACT inhaler Inhale 2 puffs into the lungs 2 (two) times daily.    [provider]  celecoxib (CELEBREX) 200 MG capsule Take 1 capsule (200 mg total) by mouth 2 (two) times daily. 08/22/19   Fausto Skillern, PA-C  dexlansoprazole (DEXILANT) 60 MG capsule Take 60 mg by mouth daily.    [provider]  enoxaparin (LOVENOX) 40 MG/0.4ML injection Inject 0.4 mLs (40 mg total) into the skin daily for 14 days. 08/22/19 09/05/19  Tamala Julian B, PA-C  lisinopril (ZESTRIL) 10 MG tablet Take 1 tablet by mouth daily. 08/21/18   [provider]  magnesium oxide (MAG-OX) 400 MG tablet Take 800 mg by mouth 2 (two) times daily.     [provider]  Multiple Vitamins-Minerals (MULTIVITAMIN WITH MINERALS) tablet Take 1 tablet by mouth daily.     [provider]  oxyCODONE (OXY IR/ROXICODONE) 5 MG immediate release tablet Take 1 tablet (5 mg total) by mouth every 4 (four) hours as needed for moderate pain (pain score 4-6). 08/22/19   Fausto Skillern, PA-C  traMADol (ULTRAM) 50 MG tablet Take 1 tablet (50 mg total) by mouth every 4 (four) hours as needed for moderate pain. 08/22/19   Fausto Skillern, PA-C    Allergies Hydrocodone and Penicillins  Family History  Problem Relation Age of Onset  . COPD Mother   . Heart disease Father     Social History Social History   Tobacco Use  . Smoking status: Current Every Day Smoker    Packs/day: 1.00    Years: 30.00    Pack years: 30.00    Types: Cigarettes  . Smokeless tobacco: Never Used  Vaping Use  . Vaping Use: Never used  Substance Use Topics  . Alcohol use: No    Alcohol/week: 58.0 standard drinks    Types: 58 Cans of beer per week    Comment: 1 pint liquer per week. Pt reports he has stopped drinking like this in August of 2017  . Drug use: No    Review of Systems Constitutional: No fever/chills Eyes: No visual changes. ENT: No sore throat. Cardiovascular: Positive for chest pain. Respiratory: Positive for shortness of breath. Gastrointestinal: No abdominal pain.  No  nausea, no vomiting.  No diarrhea.   Genitourinary: Negative for dysuria. Musculoskeletal: Positive for right knee swelling.  Skin: Negative for rash. Neurological: Negative for headaches, focal weakness or numbness.  ____________________________________________   PHYSICAL EXAM:  VITAL SIGNS: ED Triage Vitals  Enc Vitals Group     BP 01/09/20 2110 (!) 141/75     Pulse Rate 01/09/20 2107 90     Resp 01/09/20 2107 15     Temp 01/09/20 2107 97.7 F (36.5 C)     Temp Source 01/09/20 2107 Oral     SpO2 01/09/20 2110 97 %     Weight 01/09/20 2108 200 lb (90.7 kg)     Height 01/09/20 2108 5\' 6"  (1.676 m)     Head Circumference --      Peak Flow --      Pain  Score 01/09/20 2108 7   Constitutional: Alert and oriented.  Eyes: Conjunctivae are normal.  ENT      Head: Normocephalic and atraumatic.      Nose: No congestion/rhinnorhea.      Mouth/Throat: Mucous membranes are moist.      Neck: No stridor. Hematological/Lymphatic/Immunilogical: No cervical lymphadenopathy. Cardiovascular: Normal rate, regular rhythm.  No murmurs, rubs, or gallops.  Respiratory: Normal respiratory effort without tachypnea nor retractions. Breath sounds are clear and equal bilaterally. No wheezes/rales/rhonchi. Gastrointestinal: Soft and non tender. No rebound. No guarding.  Genitourinary: Deferred Musculoskeletal: Normal range of motion in all extremities. Swelling noted around the right knee.  Neurologic:  Normal speech and language. No gross focal neurologic deficits are appreciated.  Skin:  Skin is warm, dry and intact. No rash noted. Psychiatric: Mood and affect are normal. Speech and behavior are normal. Patient exhibits appropriate insight and judgment.  ____________________________________________    LABS (pertinent positives/negatives)  CBC wbc 5.6, hgb 13.1, plt 190  ____________________________________________   EKG  I, Nance Pear, attending physician, personally viewed and  interpreted this EKG  EKG Time: 2108 Rate: 103 Rhythm: sinus rhythm with ventricular bigeminy Axis: normal Intervals: qtc 386 QRS: narrow, q waves v1, v2 ST changes: no st elevation Impression: abnormal ekg  ____________________________________________    RADIOLOGY  CXR No acute abnormality  ____________________________________________   PROCEDURES  Procedures  ____________________________________________   INITIAL IMPRESSION / ASSESSMENT AND PLAN / ED COURSE  Pertinent labs & imaging results that were available during my care of the patient were reviewed by me and considered in my medical decision making (see chart for details).   Patient presented to the emergency department today because of concerns for chest pain.  On exam patient without any concerning lung auscultatory findings.  Heart rate was found to be slightly irregular.  He was found to have ventricular bigeminy on cardiac monitor although he would jump in and out of this and otherwise been in normal sinus rhythm.  Patient does have some family history of heart disease.  Initial troponin was negative.  Will want to repeat the troponin.  Patient did get some relief from his chest pain with the viscous lidocaine.  Patient does have a history of alcohol abuse and alcohol level was elevated here today.  I had a discussion with the patient that alcohol abuse could be leading to worsening heartburn.  He states he does take in an acid daily already.  I do think if repeat troponin remains negative he will be able to be discharged home.  Will prepare sucralfate.  Did discuss importance of following up with cardiology for the intermittent ventricle bigeminy. ____________________________________________   FINAL CLINICAL IMPRESSION(S) / ED DIAGNOSES  Final diagnoses:  Ventricular bigeminy  Weakness  Atypical chest pain  Gastroesophageal reflux disease, unspecified whether esophagitis present  Alcohol abuse     Note:  This dictation was prepared with Dragon dictation. Any transcriptional errors that result from this process are unintentional     Nance Pear, MD 01/09/20 2336

## 2020-01-09 NOTE — ED Triage Notes (Addendum)
PT to ED via [facility] from home for chief complaint of chest pressure/weakness/SHOB/dizziness for the past few weeks. Per EMS pt has been having PVC's bigeminy upon being placed on cardiac monitor. Pt was told he has 40% arterial blockage approx 2 years ago by Dr Caryl Comes.  Pt has also noticed swelling in RLE.   Pt alert and oriented.  Pt in NAD, RR even and unlabored.  324 asa given by EMS PTA 18 G to L AC by EMS

## 2020-01-09 NOTE — Discharge Instructions (Signed)
As we discussed please stop using alcohol. It is likely that this is contributing to your chest pain (heart burn). Please seek medical attention for any high fevers, chest pain, shortness of breath, change in behavior, persistent vomiting, bloody stool or any other new or concerning symptoms.

## 2020-01-10 LAB — TROPONIN I (HIGH SENSITIVITY): Troponin I (High Sensitivity): 11 ng/L (ref <18)

## 2020-02-10 IMAGING — MR MR MRA HEAD W/O CM
10 of 13 series · 33 of 48 positions shown · non-contrast
Comparison: Head CT same day

CLINICAL DATA: Left-sided facial numbness beginning today. Left arm
numbness in weakness 3 days ago.

EXAM:
MRI HEAD WITHOUT CONTRAST
MRA HEAD WITHOUT CONTRAST
TECHNIQUE: Multiplanar, multiecho pulse sequences of the brain and surrounding
structures were obtained without intravenous contrast. Angiographic
images of the head were obtained using MRA technique without
contrast.

[Series 10: T1 · sagittal · 5.0mm · 0.62mm/px · 2 of 25 slices shown (1 of 2)]
[im 1/25]
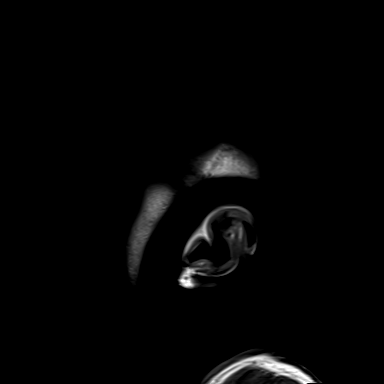
[im 25/25]
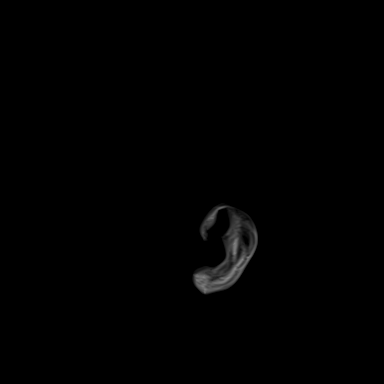

[Series 11: ax dwi_tracew · axial · 3.0mm · 0.60mm/px · z∈[-53,+107]mm · 4 of 55 slices shown]
[im 1/55]
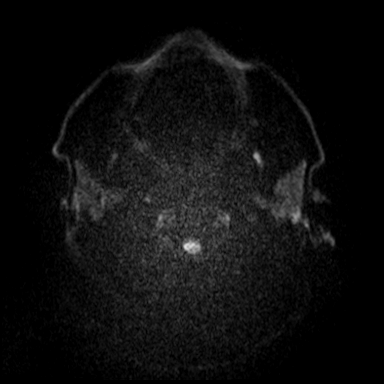
[im 19/55]
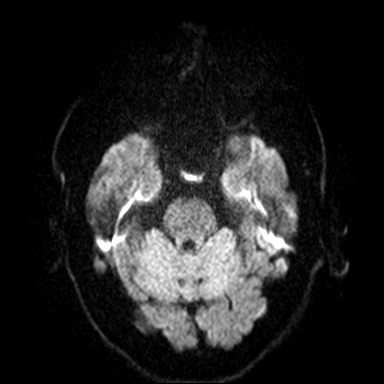
[im 37/55]
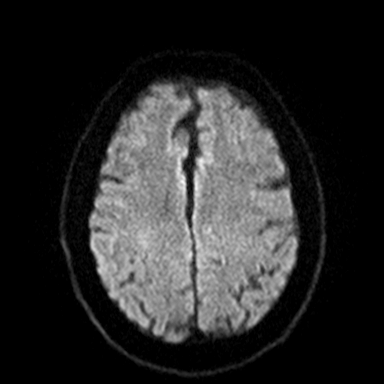
[im 55/55]
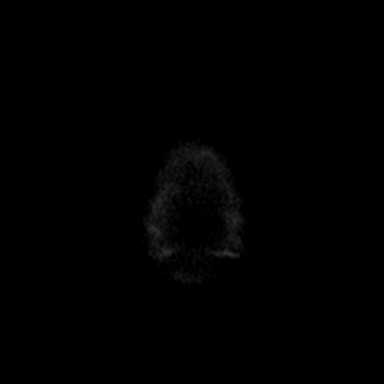

[Series 12: ax dwi_adc · axial · 3.0mm · 0.60mm/px · z∈[-53,+107]mm · 3 of 55 slices shown]
[im 1/55]
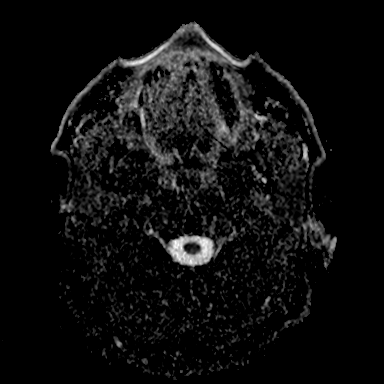
[im 28/55]
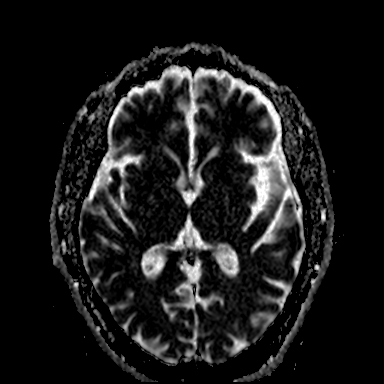
[im 55/55]
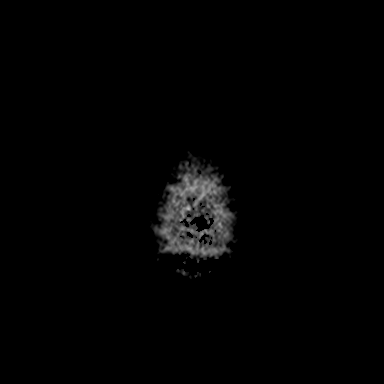

[Series 13: cor dwi_tracew · coronal · 5.0mm · 0.60mm/px · 2 of 43 slices shown]
[im 1/43]
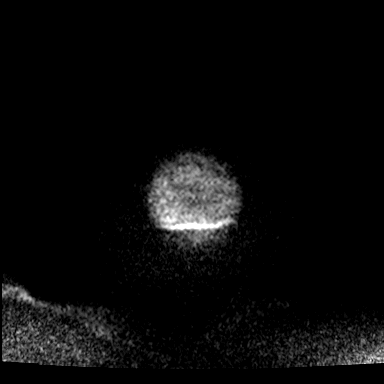
[im 43/43]
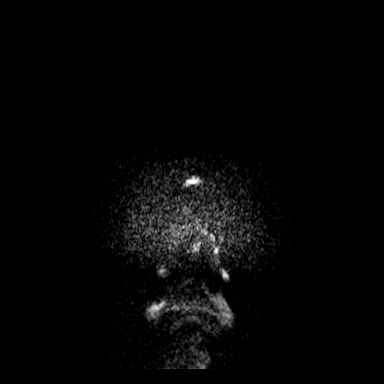

[Series 14: cor dwi_adc · coronal · 5.0mm · 0.60mm/px · 2 of 43 slices shown]
[im 1/43]
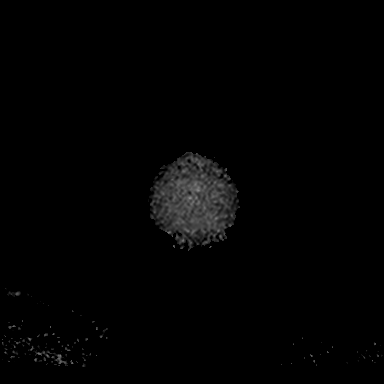
[im 43/43]
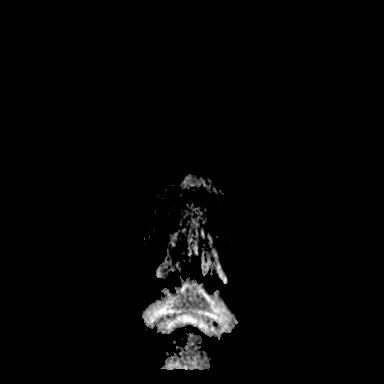

[Series 15: TOF · axial · 0.5mm · 0.41mm/px · z∈[-48,+22]mm · 6 of 205 slices shown]
[im 1/205]
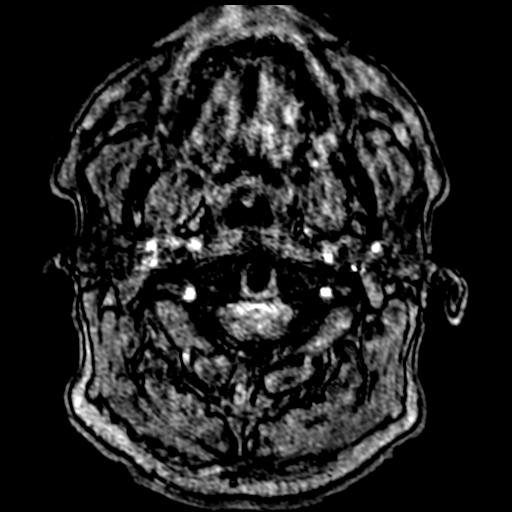
[im 41/205]
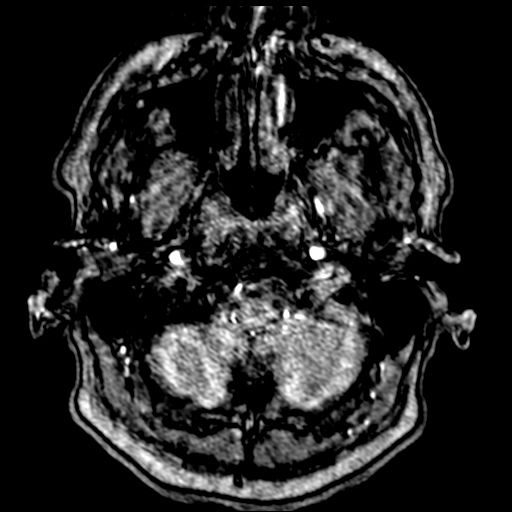
[im 62/205]
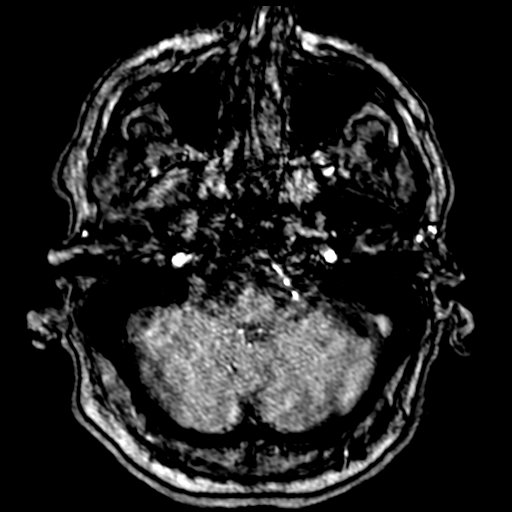
[im 82/205]
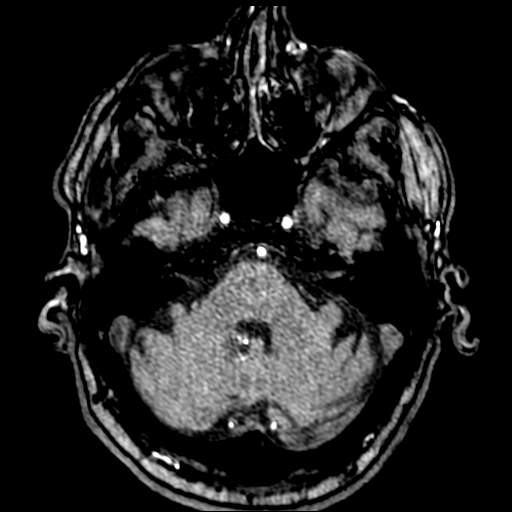
[im 123/205]
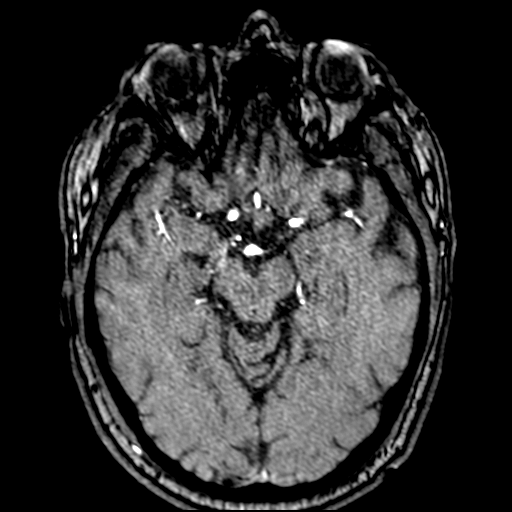
[im 143/205]
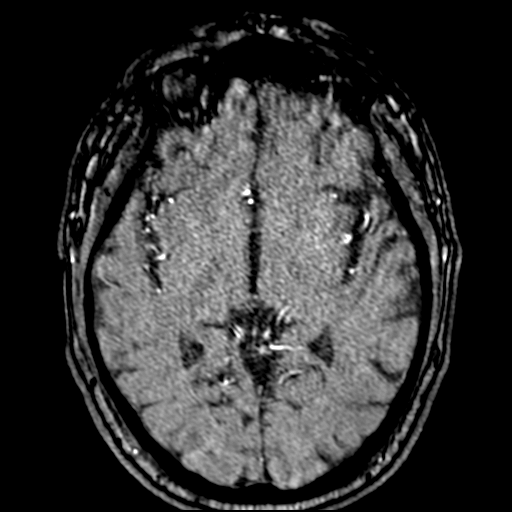

[Series 20: T2 · axial · 5.0mm · 0.53mm/px · 1 of 27 slices shown (1 of 2)]
[im 1/27]
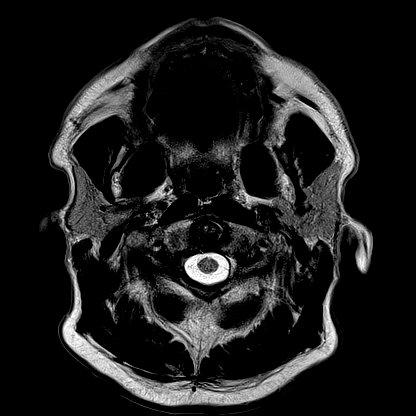

[Series 25: FLAIR · axial · 3.0mm · 0.53mm/px · z∈[-53,+107]mm · 3 of 55 slices shown]
[im 1/55]
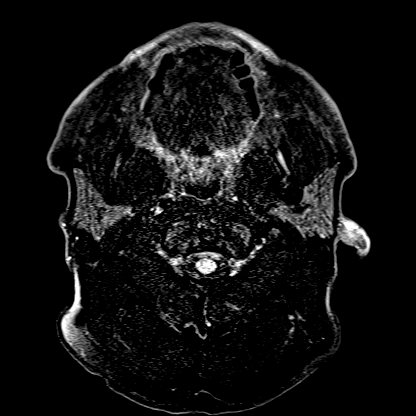
[im 28/55]
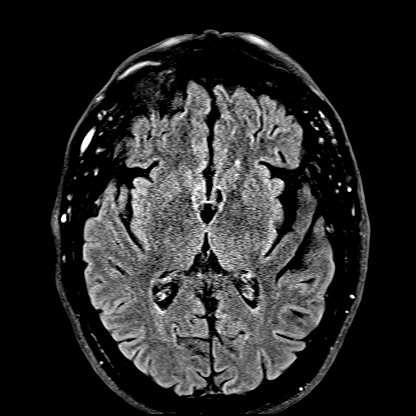
[im 55/55]
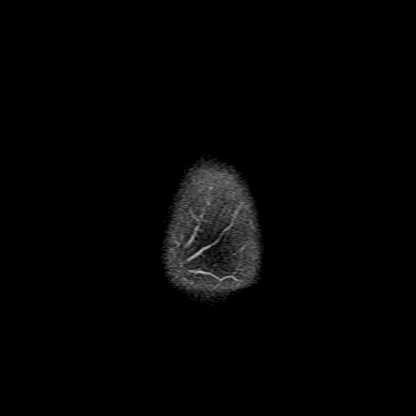

[Series 26: T1 · axial · 1.0mm · 0.98mm/px · z∈[-58,+115]mm · 8 of 176 slices shown (2 of 2)]
[im 1/176]
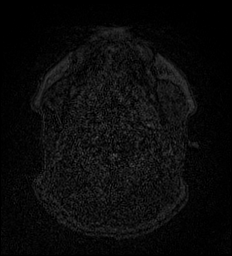
[im 22/176]
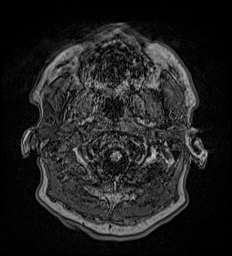
[im 44/176]
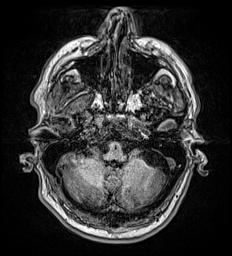
[im 66/176]
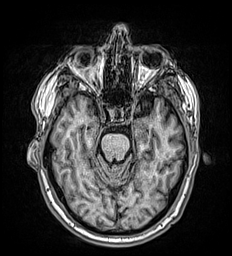
[im 110/176]
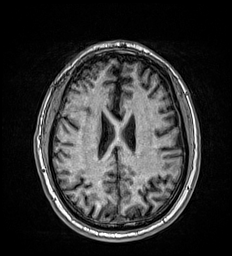
[im 132/176]
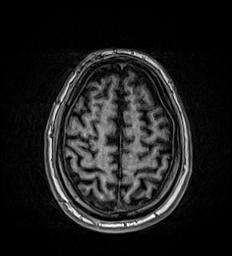
[im 154/176]
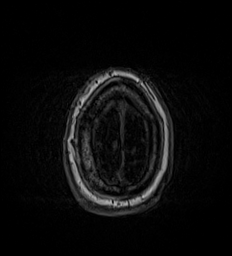
[im 176/176]
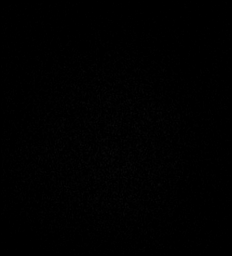

[Series 27: T2 · coronal · 5.0mm · 0.57mm/px · 2 of 29 slices shown (2 of 2)]
[im 1/29]
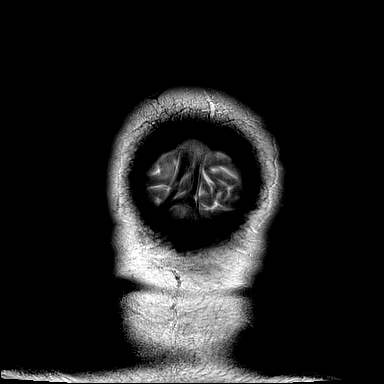
[im 29/29]
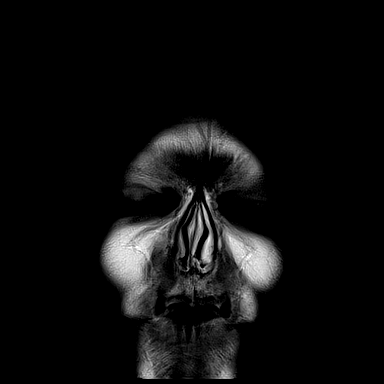

[33 of 48 positions shown; findings below may reference images not displayed]

FINDINGS: MRI HEAD FINDINGS

Brain: Diffusion imaging does not show any acute or subacute
infarction. The brainstem and cerebellum are normal. Cerebral
hemispheres are normal. No mass, hemorrhage, hydrocephalus or
extra-axial collection.

Vascular: Major vessels at the base of the brain show flow.

Skull and upper cervical spine: Negative

Sinuses/Orbits: Clear/normal

Other: None

MRA HEAD FINDINGS

Both internal carotid arteries are widely patent into the brain. No
siphon stenosis. The anterior and middle cerebral vessels are patent
without proximal stenosis, aneurysm or vascular malformation.

Both vertebral arteries are widely patent to the basilar. Proximal
basilar fenestration, incidental. No basilar stenosis. Posterior
circulation branch vessels appear normal.
IMPRESSION: Normal appearance of the brain.

Normal intracranial MR angiography of the large and medium size
vessels.

## 2020-02-10 IMAGING — CT CT HEAD W/O CM
3 series · 16 of 47 positions shown, 19 images · non-contrast
Comparison: None.

CLINICAL DATA: High blood pressure since yesterday. Left-sided
numbness and weakness.

EXAM:
CT HEAD WITHOUT CONTRAST
TECHNIQUE: Contiguous axial images were obtained from the base of the skull
through the vertex without intravenous contrast.

[Series 3: head wo · axial · 0.42mm/px · z∈[-145,-20]mm · 10 of 30 slices shown, 13 images]
[im 3/30  brain]
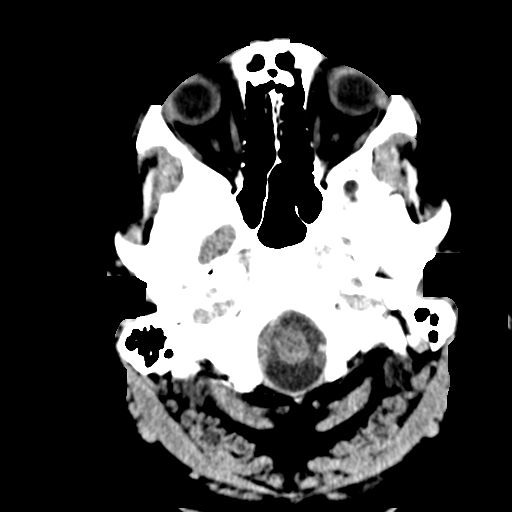
[im 3/30  bone]
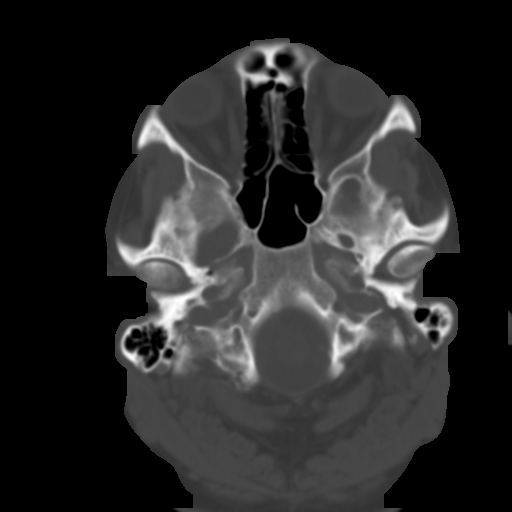
[im 6/30  brain]
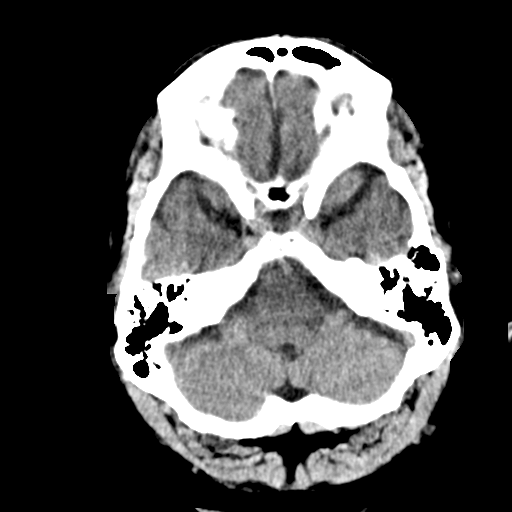
[im 9/30  brain]
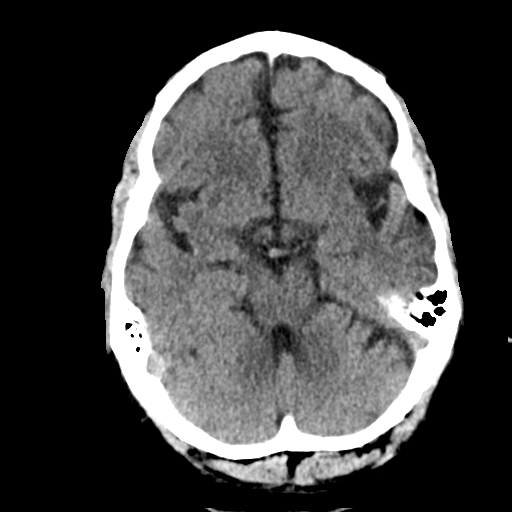
[im 11/30  brain]
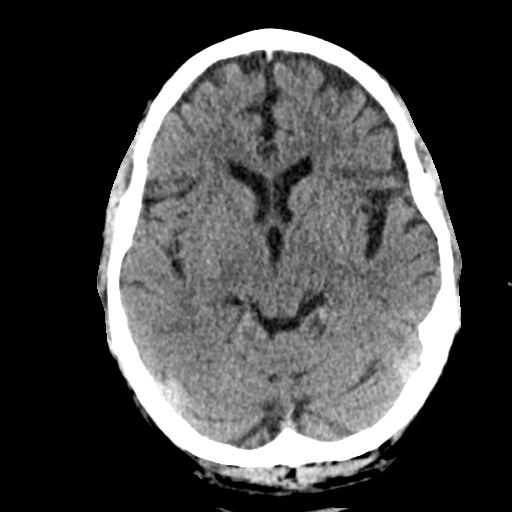
[im 14/30  brain]
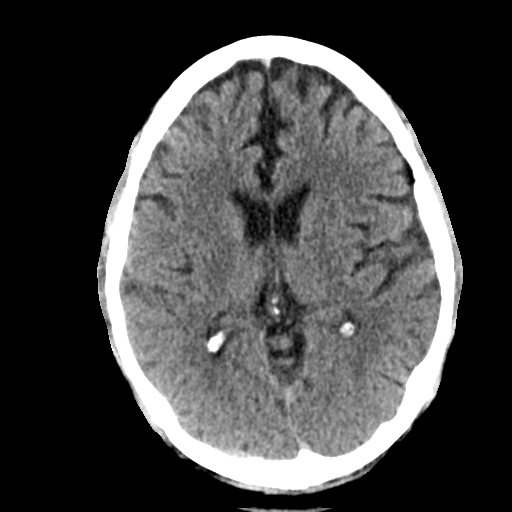
[im 14/30  bone]
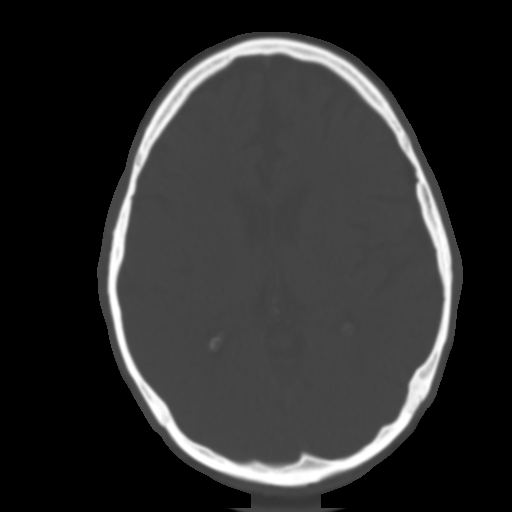
[im 17/30  brain]
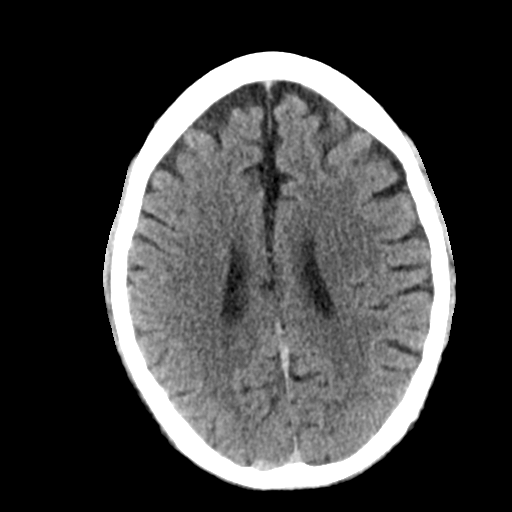
[im 20/30  brain]
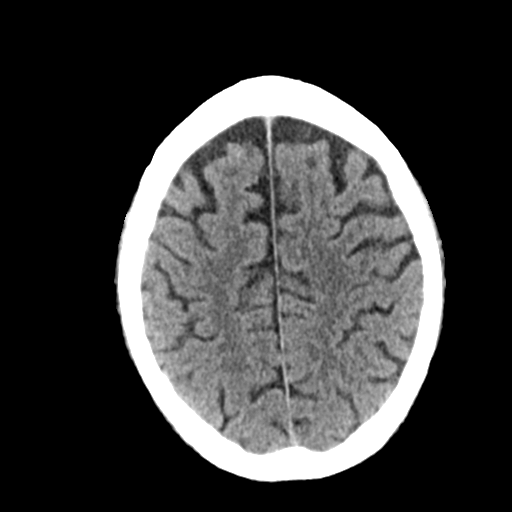
[im 23/30  brain]
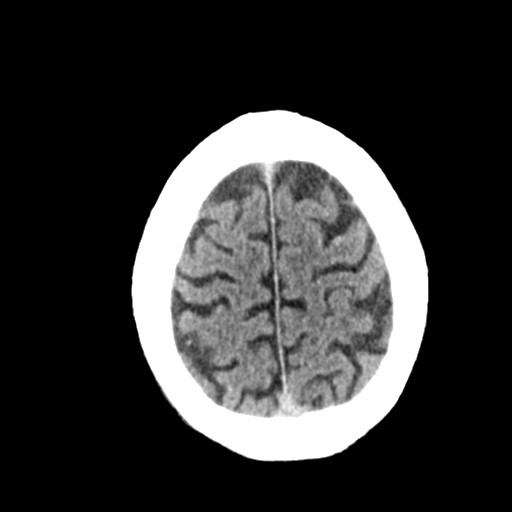
[im 25/30  brain]
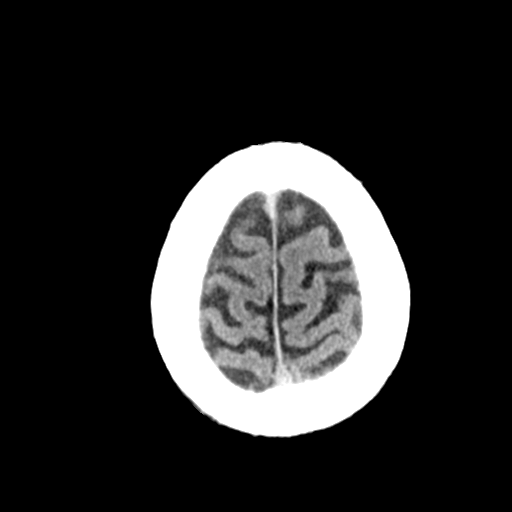
[im 25/30  bone]
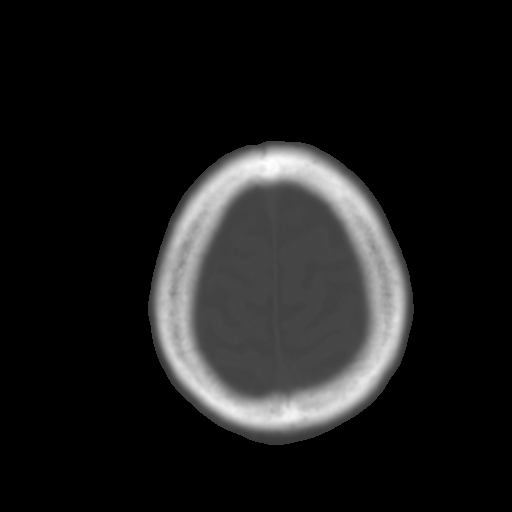
[im 28/30  brain]
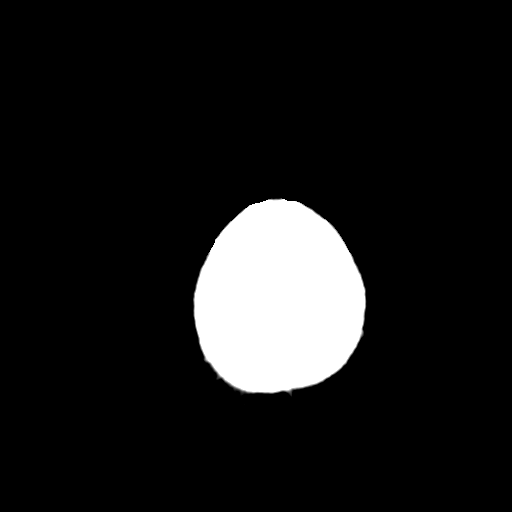

[Series 4: coronal soft tissue · coronal · 0.33mm/px · 3 of 69 slices shown]
[im 23/69  brain]
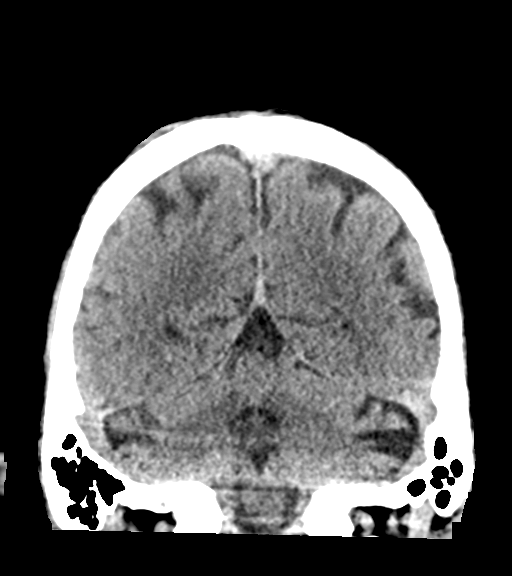
[im 31/69  brain]
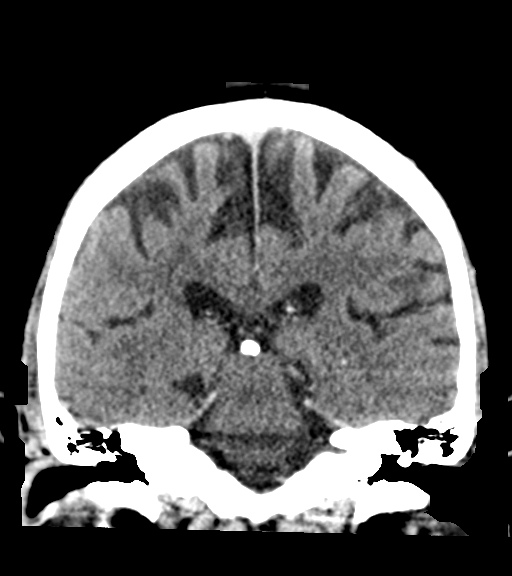
[im 38/69  brain]
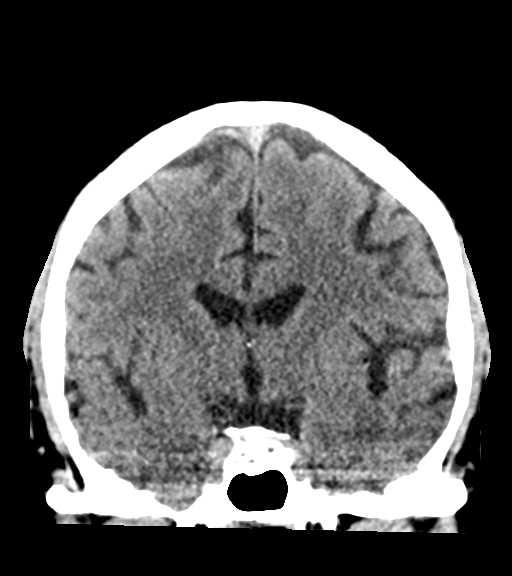

[Series 5: sagittal soft tissue · sagittal · 0.33mm/px · 3 of 56 slices shown]
[im 19/56  brain]
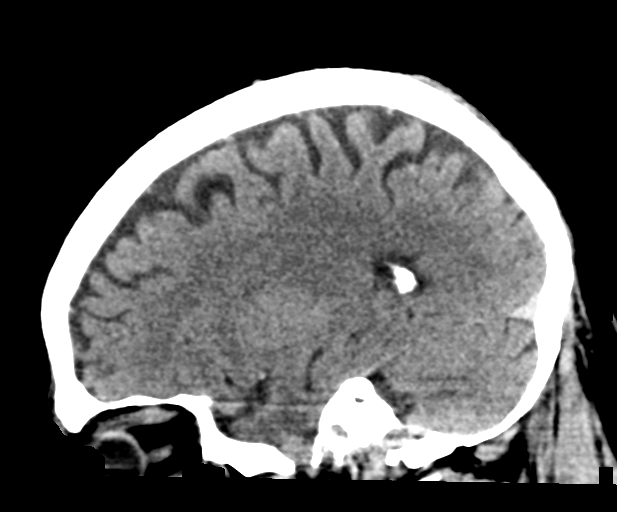
[im 28/56  brain]
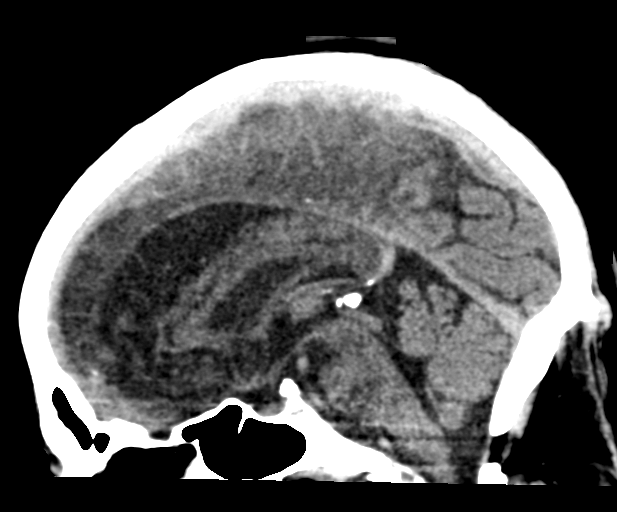
[im 37/56  brain]
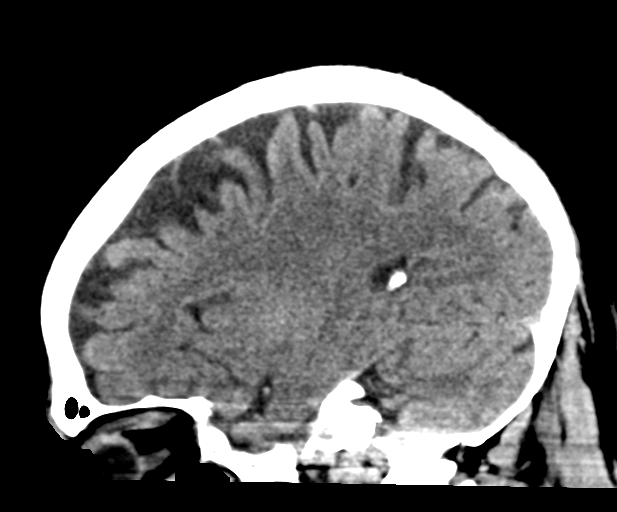

[16 of 47 positions shown; findings below may reference images not displayed]

FINDINGS: Brain: No evidence of acute infarction, hemorrhage, hydrocephalus,
extra-axial collection or mass lesion/mass effect.

Vascular: No hyperdense vessel or unexpected calcification.

Skull: Normal. Negative for fracture or focal lesion.

Sinuses/Orbits: No acute finding.

Other: None.
IMPRESSION: No acute intracranial abnormalities identified.

## 2020-02-26 DIAGNOSIS — K589 Irritable bowel syndrome without diarrhea: Secondary | ICD-10-CM | POA: Insufficient documentation

## 2020-05-12 ENCOUNTER — Other Ambulatory Visit: Payer: Self-pay | Admitting: Physical Medicine & Rehabilitation

## 2020-05-12 ENCOUNTER — Ambulatory Visit
Admission: RE | Admit: 2020-05-12 | Discharge: 2020-05-12 | Disposition: A | Discharge status: Home / Self Care | Payer: Medicare Other | Source: Ambulatory Visit | Attending: Physical Medicine & Rehabilitation | Admitting: Physical Medicine & Rehabilitation

## 2020-05-12 ENCOUNTER — Other Ambulatory Visit: Payer: Self-pay

## 2020-05-12 DIAGNOSIS — M5416 Radiculopathy, lumbar region: Secondary | ICD-10-CM

## 2020-05-12 DIAGNOSIS — R29898 Other symptoms and signs involving the musculoskeletal system: Secondary | ICD-10-CM | POA: Insufficient documentation

## 2020-05-12 DIAGNOSIS — M5441 Lumbago with sciatica, right side: Secondary | ICD-10-CM | POA: Insufficient documentation

## 2020-05-23 ENCOUNTER — Emergency Department: Payer: Medicare Other

## 2020-05-23 ENCOUNTER — Other Ambulatory Visit: Payer: Self-pay

## 2020-05-23 ENCOUNTER — Emergency Department
Admission: EM | Admit: 2020-05-23 | Discharge: 2020-05-23 | Disposition: A | Discharge status: Home / Self Care | Payer: Medicare Other | Attending: Emergency Medicine | Admitting: Emergency Medicine

## 2020-05-23 DIAGNOSIS — Z955 Presence of coronary angioplasty implant and graft: Secondary | ICD-10-CM | POA: Insufficient documentation

## 2020-05-23 DIAGNOSIS — Y92009 Unspecified place in unspecified non-institutional (private) residence as the place of occurrence of the external cause: Secondary | ICD-10-CM | POA: Diagnosis not present

## 2020-05-23 DIAGNOSIS — R531 Weakness: Secondary | ICD-10-CM | POA: Insufficient documentation

## 2020-05-23 DIAGNOSIS — Z79899 Other long term (current) drug therapy: Secondary | ICD-10-CM | POA: Insufficient documentation

## 2020-05-23 DIAGNOSIS — Z7951 Long term (current) use of inhaled steroids: Secondary | ICD-10-CM | POA: Insufficient documentation

## 2020-05-23 DIAGNOSIS — Z96651 Presence of right artificial knee joint: Secondary | ICD-10-CM | POA: Insufficient documentation

## 2020-05-23 DIAGNOSIS — M545 Low back pain, unspecified: Secondary | ICD-10-CM | POA: Insufficient documentation

## 2020-05-23 DIAGNOSIS — I2510 Atherosclerotic heart disease of native coronary artery without angina pectoris: Secondary | ICD-10-CM | POA: Insufficient documentation

## 2020-05-23 DIAGNOSIS — F17210 Nicotine dependence, cigarettes, uncomplicated: Secondary | ICD-10-CM | POA: Diagnosis not present

## 2020-05-23 DIAGNOSIS — I10 Essential (primary) hypertension: Secondary | ICD-10-CM | POA: Diagnosis not present

## 2020-05-23 DIAGNOSIS — W19XXXA Unspecified fall, initial encounter: Secondary | ICD-10-CM | POA: Insufficient documentation

## 2020-05-23 DIAGNOSIS — E039 Hypothyroidism, unspecified: Secondary | ICD-10-CM | POA: Diagnosis not present

## 2020-05-23 DIAGNOSIS — M25552 Pain in left hip: Secondary | ICD-10-CM | POA: Insufficient documentation

## 2020-05-23 DIAGNOSIS — J449 Chronic obstructive pulmonary disease, unspecified: Secondary | ICD-10-CM | POA: Diagnosis not present

## 2020-05-23 LAB — COMPREHENSIVE METABOLIC PANEL
ALT: 21 U/L (ref 0–44)
AST: 34 U/L (ref 15–41)
Albumin: 3.9 g/dL (ref 3.5–5.0)
Alkaline Phosphatase: 52 U/L (ref 38–126)
Anion gap: 14 (ref 5–15)
BUN: 9 mg/dL (ref 8–23)
CO2: 27 mmol/L (ref 22–32)
Calcium: 8.8 mg/dL — ABNORMAL LOW (ref 8.9–10.3)
Chloride: 98 mmol/L (ref 98–111)
Creatinine, Ser: 1.02 mg/dL (ref 0.61–1.24)
GFR, Estimated: >60 mL/min (ref >60)
Glucose, Bld: 91 mg/dL (ref 70–99)
Potassium: 4 mmol/L (ref 3.5–5.1)
Sodium: 139 mmol/L (ref 135–145)
Total Bilirubin: 0.7 mg/dL (ref 0.3–1.2)
Total Protein: 7.9 g/dL (ref 6.5–8.1)

## 2020-05-23 LAB — BLOOD GAS, VENOUS
Acid-Base Excess: 7.3 mmol/L — ABNORMAL HIGH (ref 0.0–2.0)
Bicarbonate: 34.9 mmol/L — ABNORMAL HIGH (ref 20.0–28.0)
O2 Saturation: 70.9 %
Patient temperature: 37
pCO2, Ven: 59 mmHg (ref 44.0–60.0)
pH, Ven: 7.38 (ref 7.250–7.430)
pO2, Ven: 38 mmHg (ref 32.0–45.0)

## 2020-05-23 LAB — CBC WITH DIFFERENTIAL/PLATELET
Abs Immature Granulocytes: 0.06 10*3/uL (ref 0.00–0.07)
Basophils Absolute: 0 10*3/uL (ref 0.0–0.1)
Basophils Relative: 1 %
Eosinophils Absolute: 0.1 10*3/uL (ref 0.0–0.5)
Eosinophils Relative: 1 %
HCT: 48.6 % (ref 39.0–52.0)
Hemoglobin: 15.8 g/dL (ref 13.0–17.0)
Immature Granulocytes: 1 %
Lymphocytes Relative: 24 %
Lymphs Abs: 1.8 10*3/uL (ref 0.7–4.0)
MCH: 29.6 pg (ref 26.0–34.0)
MCHC: 32.5 g/dL (ref 30.0–36.0)
MCV: 91.2 fL (ref 80.0–100.0)
Monocytes Absolute: 0.7 10*3/uL (ref 0.1–1.0)
Monocytes Relative: 9 %
Neutro Abs: 5 10*3/uL (ref 1.7–7.7)
Neutrophils Relative %: 64 %
Platelets: 199 10*3/uL (ref 150–400)
RBC: 5.33 MIL/uL (ref 4.22–5.81)
RDW: 15.7 % — ABNORMAL HIGH (ref 11.5–15.5)
WBC: 7.7 10*3/uL (ref 4.0–10.5)
nRBC: 0 % (ref 0.0–0.2)

## 2020-05-23 LAB — TROPONIN I (HIGH SENSITIVITY): Troponin I (High Sensitivity): 9 ng/L (ref <18)

## 2020-05-23 LAB — ETHANOL: Alcohol, Ethyl (B): 238 mg/dL — ABNORMAL HIGH (ref <10)

## 2020-05-23 MED ORDER — ONDANSETRON HCL 4 MG/2ML IJ SOLN
4.0000 mg | Freq: Once | INTRAMUSCULAR | Status: AC
  Administered 2020-05-23: 4 mg via INTRAVENOUS
  Filled 2020-05-23: qty 2

## 2020-05-23 MED ORDER — MORPHINE SULFATE (PF) 4 MG/ML IV SOLN
4.0000 mg | Freq: Once | INTRAVENOUS | Status: AC
  Administered 2020-05-23: 4 mg via INTRAVENOUS
  Filled 2020-05-23: qty 1

## 2020-05-23 NOTE — ED Notes (Addendum)
Pt placed on 1L [place] per PA

## 2020-05-23 NOTE — ED Notes (Signed)
Pt provided with blanket and ice chips per his request with EDP permission. Pt resting in bed with lights dimmed, remains on 2L via [place]. Pt with personal cell phone at bedside. Call bell remains within reach.

## 2020-05-23 NOTE — ED Notes (Signed)
Pt placed on room air.

## 2020-05-23 NOTE — ED Triage Notes (Signed)
Pt comes EMS from home after an unwitnessed fall. Pt states right leg "buckled" and went out from under him. Pt c/o left sided back pain from the fall. Pt was hypertensive with EMS at 168/90. Pt not on BP meds. Pt recently had spine MRI. Pt having trouble holding right leg up but otherwise neurologically intact. States this is due to pain.

## 2020-05-23 NOTE — ED Notes (Signed)
Pt seemingly becoming more confused even before pain meds were given. PA at bedside to assess. Pt telling this RN same story over again. Denies anything to drink (ETOH) this morning. States he had coffee.

## 2020-05-23 NOTE — ED Notes (Signed)
Pt upped to 2L [place]

## 2020-05-23 NOTE — ED Notes (Signed)
Pt repositioned in bed by this RN and Rebekah Chesterfield, Radio producer. Pt provided with remote per his request. Pt placed back on oxygen at this time. Pt encouraged to use call bell should needs arise. Pt noted to become tachycardic with movement and with moving to sit on end of bed.

## 2020-05-23 NOTE — ED Notes (Signed)
Pt taken to xray/CT

## 2020-05-23 NOTE — ED Notes (Signed)
This RN to bedside, introduced self to patient. Pt alert and speaking to this RN. Pt currently on 2L via [place]. Pt c/o continued pain 9/10, also c/o HA at this time. Call bell placed within reach of patient, lights dimmed for comfort. Pt denies further needs at this time.

## 2020-05-23 NOTE — ED Provider Notes (Signed)
[facility] Emergency Department Provider Note  ____________________________________________   First MD Initiated Contact with Patient 05/23/20 [phone]     (approximate)  I have reviewed the triage vital signs and the nursing notes.   HISTORY  Chief Complaint Fall and Weakness    HPI Russell Rivas is a 64 y.o. male presents via EMS from home.  Had an unwitnessed fall earlier today.  Patient states his right leg buckled and went out from under him and he fell on his left side.  Is complaining of pain along the left side of his back and hip.  Patient does have chronic back problems and recently had an MRI.  He states he has weakness in the right leg mostly due to the back pain.  He was able to walk from the EMS stretcher to our stretcher.  He denies any numbness or tingling.   He is unsure if he hit his head or had LOC.  Patient is taking ASA per day. Has copd but does not use O2 at home   Past Medical History:  Diagnosis Date  . Alcohol abuse    pt reports drinking at least one pint everyday.   . Anxiety   . Arthritis   . COPD (chronic obstructive pulmonary disease) (HCC)    NO inhalers  . Depression   . GERD (gastroesophageal reflux disease)   . Hypertension   . Hypothyroidism    does not take medication at this time.  has in the past  . Polio    as a child, caused poblems in right knee.    Patient Active Problem List   Diagnosis Date Noted  . Total knee replacement status 08/21/2019  . Status post left rotator cuff repair 11/30/2018  . Depression, prolonged 08/28/2018  . Hypothyroidism 08/28/2018  . TIA (transient ischemic attack) 08/20/2018  . COPD mixed type ([place]) 04/26/2017  . Coronary artery disease, non-occlusive 04/26/2017  . Tobacco abuse 04/26/2017  . Primary osteoarthritis of right knee 05/30/2016  . Blood in stool   . Rectal polyp   . First degree hemorrhoids   . Increased MCV 06/17/2015  . Current tear of meniscus 12/05/2014  .  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 Lumbar radiculopathy 04/15/2013  . Complete rotator cuff rupture of left shoulder 09/04/2012    Past Surgical History:  Procedure Laterality Date  . APPENDECTOMY    . CARDIAC CATHETERIZATION Left 10/02/2015   Procedure: Left Heart Cath and Coronary Angiography;  Surgeon: Dionisio David, MD;  Location: [place] [facility];  Service: Cardiovascular;  Laterality: Left;  . COLONOSCOPY WITH PROPOFOL N/A 12/08/2015   Procedure: COLONOSCOPY WITH PROPOFOL;  Surgeon: Lucilla Lame, MD;  Location: [facility] END[facility];  Service: Endoscopy;  Laterality: N/A;  . KNEE ARTHROPLASTY Right 08/21/2019   Procedure: COMPUTER ASSISTED TOTAL KNEE ARTHROPLASTY;  Surgeon: Dereck Leep, MD;  Location: [facility] ORS;  Service: Orthopedics;  Laterality: Right;  . KNEE ARTHROSCOPY Right 09/02/2015   Procedure: ARTHROSCOPY KNEE, debridement, microfracture;  Surgeon: Leanor Kail, MD;  Location: [facility] ORS;  Service: Orthopedics;  Laterality: Right;  . KNEE ARTHROSCOPY WITH MEDIAL MENISECTOMY Right 11/26/2014   Procedure: KNEE ARTHROSCOPY WITH MEDIAL MENISECTOMY;  Surgeon: Leanor Kail, MD;  Location: [facility] ORS;  Service: Orthopedics;  Laterality: Right;  . KNEE LIGAMENT RECONSTRUCTION Right 1959   pt did not have ligaments, ligaments were made and implanted.  Marland Kitchen KNEE SURGERY Left 1969   growth bone removed.  Marland Kitchen SHOULDER ARTHROSCOPY Bilateral  one in January and one in June    Prior to Admission medications   Medication Sig Start Date End Date Taking? Authorizing Provider  acetaminophen (TYLENOL) 500 MG tablet Take 500 mg by mouth every 6 (six) hours as needed for moderate pain or headache.    [provider]  albuterol (VENTOLIN HFA) 108 (90 Base) MCG/ACT inhaler Inhale 2 puffs into the lungs 2 (two) times daily.    [provider]  celecoxib (CELEBREX) 200 MG capsule Take 1 capsule (200 mg total) by mouth 2 (two) times daily. 08/22/19   Fausto Skillern, PA-C   dexlansoprazole (DEXILANT) 60 MG capsule Take 60 mg by mouth daily.    [provider]  enoxaparin (LOVENOX) 40 MG/0.4ML injection Inject 0.4 mLs (40 mg total) into the skin daily for 14 days. 08/22/19 09/05/19  Tamala Julian B, PA-C  lisinopril (ZESTRIL) 10 MG tablet Take 1 tablet by mouth daily. 08/21/18   [provider]  magnesium oxide (MAG-OX) 400 MG tablet Take 800 mg by mouth 2 (two) times daily.     [provider]  Multiple Vitamins-Minerals (MULTIVITAMIN WITH MINERALS) tablet Take 1 tablet by mouth daily.     [provider]  oxyCODONE (OXY IR/ROXICODONE) 5 MG immediate release tablet Take 1 tablet (5 mg total) by mouth every 4 (four) hours as needed for moderate pain (pain score 4-6). 08/22/19   Tamala Julian B, PA-C  sucralfate (CARAFATE) 1 g tablet Take 1 tablet (1 g total) by mouth 4 (four) times daily. 01/09/20   Nance Pear, MD  traMADol (ULTRAM) 50 MG tablet Take 1 tablet (50 mg total) by mouth every 4 (four) hours as needed for moderate pain. 08/22/19   Fausto Skillern, PA-C    Allergies Hydrocodone and Penicillins  Family History  Problem Relation Age of Onset  . COPD Mother   . Heart disease Father     Social History Social History   Tobacco Use  . Smoking status: Current Every Day Smoker    Packs/day: 1.00    Years: 30.00    Pack years: 30.00    Types: Cigarettes  . Smokeless tobacco: Never Used  Vaping Use  . Vaping Use: Never used  Substance Use Topics  . Alcohol use: No    Alcohol/week: 58.0 standard drinks    Types: 58 Cans of beer per week    Comment: 1 pint liquer per week. Pt reports he has stopped drinking like this in August of 2017  . Drug use: No    Review of Systems  Constitutional: No fever/chills Eyes: No visual changes. ENT: No sore throat. Respiratory: Denies cough Cardiovascular: Denies chest pain Gastrointestinal: Denies abdominal pain Genitourinary: Negative for dysuria. Musculoskeletal:  Negative for back pain.  Positive for left hip pain Skin: Negative for rash. Psychiatric: no mood changes,     ____________________________________________   PHYSICAL EXAM:  VITAL SIGNS: ED Triage Vitals  Enc Vitals Group     BP 05/23/20 0814 (!) 148/92     Pulse Rate 05/23/20 0814 93     Resp 05/23/20 0814 18     Temp 05/23/20 0814 97.9 F (36.6 C)     Temp Source 05/23/20 0814 Oral     SpO2 05/23/20 0814 94 %     Weight 05/23/20 0815 200 lb (90.7 kg)     Height 05/23/20 0815 5\' 7"  (1.702 m)     Head Circumference --      Peak Flow --  Pain Score 05/23/20 0814 9     Pain Loc --      Pain Edu? --      Excl. in [place]? --     Constitutional: Alert and oriented. Well appearing and in no acute distress. Eyes: Conjunctivae are normal.  Head: Atraumatic. Nose: No congestion/rhinnorhea. Mouth/Throat: Mucous membranes are moist.   Neck:  supple no lymphadenopathy noted Cardiovascular: Normal rate, regular rhythm. Heart sounds are normal Respiratory: Normal respiratory effort.  No retractions, lungs c t a  GU: deferred Musculoskeletal: FROM all extremities, warm and well perfused, left hip is tender to palpation, lumbar spine is tender but the patient states this is his normal amount of pain Neurologic:  Normal speech and language.  Skin:  Skin is warm, dry and intact. No rash noted. Psychiatric: Mood and affect are normal. Speech and behavior are normal.  ____________________________________________   LABS (all labs ordered are listed, but only abnormal results are displayed)  Labs Reviewed  COMPREHENSIVE METABOLIC PANEL - Abnormal; Notable for the following components:      Result Value   Calcium 8.8 (*)    All other components within normal limits  CBC WITH DIFFERENTIAL/PLATELET - Abnormal; Notable for the following components:   RDW 15.7 (*)    All other components within normal limits  BLOOD GAS, VENOUS - Abnormal; Notable for the following components:    Bicarbonate 34.9 (*)    Acid-Base Excess 7.3 (*)    All other components within normal limits  ETHANOL - Abnormal; Notable for the following components:   Alcohol, Ethyl (B) 238 (*)    All other components within normal limits  TROPONIN I (HIGH SENSITIVITY)  TROPONIN I (HIGH SENSITIVITY)   ____________________________________________   ____________________________________________  RADIOLOGY  X-ray of the left hip CT of the head  ____________________________________________   PROCEDURES  Procedure(s) performed: No  Procedures    ____________________________________________   INITIAL IMPRESSION / ASSESSMENT AND PLAN / ED COURSE  Pertinent labs & imaging results that were available during my care of the patient were reviewed by me and considered in my medical decision making (see chart for details).   Patient is a 64 year old male presents with unwitnessed fall today.  Physical exam shows patient to have tenderness along the left hip and lower back.  DDx: Left hip fracture, left hip contusion, chronic back pain, head injury secondary to fall  CT of the head, x-ray of the left hip  ----------------------------------------- 9:43 AM on 05/23/2020 -----------------------------------------  Patient's O2 dropped to 89/91.  We did place him on 2 L of oxygen which raised it to 95%.  The patient has act a little more confused since his fall.  I do feel he would benefit from being moved to the major side for further evaluation.  We did order an EKG, lab work due to his past medical history.  The unwitnessed fall is concerning as he does not have a good reason for his fall.  Also his wife stated that he has fallen multiple times.  Report  given to Dr. Cheri Fowler.  Patient  moved to room 25.   CT of the head shows atrophy but no acute intracranial abnormality. This was also reviewed by me. The x-ray of the left hip is negative for fracture. This was also reviewed by me.   AMRITPAL SHROPSHIRE was evaluated in Emergency Department on 05/23/2020 for the symptoms described in the history of present illness. He was evaluated in the context of the [place] COVID-19  pandemic, which necessitated consideration that the patient might be at risk for infection with the SARS-CoV-2 virus that causes COVID-19. Institutional protocols and algorithms that pertain to the evaluation of patients at risk for COVID-19 are in a state of rapid change based on information released by regulatory bodies including the CDC and federal and state organizations. These policies and algorithms were followed during the patient's care in the ED.    As part of my medical decision making, I reviewed the following data within the [place] notes reviewed and incorporated, Labs reviewed CBC is normal, EKG interpreted see physician read, Old chart reviewed, Radiograph reviewed , Evaluated by EM attending , Notes from prior ED visits and [place] Controlled Substance Database  ____________________________________________   FINAL CLINICAL IMPRESSION(S) / ED DIAGNOSES  Final diagnoses:  Fall, initial encounter      NEW MEDICATIONS STARTED DURING THIS VISIT:  Discharge Medication List as of 05/23/2020 12:04 PM       Note:  This document was prepared using Dragon voice recognition software and may include unintentional dictation errors.    Versie Starks, PA-C 05/23/20 1256    Naaman Plummer, MD 05/24/20 [phone]

## 2020-05-23 NOTE — ED Provider Notes (Signed)
Emergency department transfer of care note  Care of this patient was signed out to me by the previous provider due to incidentally found hypoxia for which patient was requiring 2 L oxygen by nasal cannula.  Patient does have history of COPD but states that he does not feel any shortness of breath or chest tightness.  Patient has physical exam shows clear breath sounds auscultation bilaterally.  Patient was weaned off oxygen during my care and patient maintained adequate oxygenation off any supplemental oxygen.  The patient has been reexamined and is ready to be discharged.  All diagnostic results have been reviewed and discussed with the patient/family.  Care plan has been outlined and the patient/family understands all current diagnoses, results, and treatment plans.  There are no new complaints, changes, or physical findings at this time.  All questions have been addressed and answered.  All medications, if any, that were given while in the emergency department or any that are being prescribed have been reviewed with the patient/family.  All side effects and adverse reactions have been explained.  Patient was instructed to, and agrees to follow-up with their primary care physician as well as return to the emergency department if any new or worsening symptoms develop.   Naaman Plummer, MD 05/23/20 1242

## 2020-05-23 NOTE — ED Notes (Signed)
Pt uncomfortable and moving around in the bed. When moving, pt desats to 89% and HR jumps to 110. PA aware. See orders for pain meds.

## 2020-06-16 ENCOUNTER — Other Ambulatory Visit: Payer: Self-pay | Admitting: Neurosurgery

## 2020-06-23 ENCOUNTER — Encounter
Admission: RE | Admit: 2020-06-23 | Discharge: 2020-06-23 | Disposition: A | Discharge status: Home / Self Care | Payer: Medicare Other | Source: Ambulatory Visit | Attending: Neurosurgery | Admitting: Neurosurgery

## 2020-06-23 ENCOUNTER — Other Ambulatory Visit: Payer: Self-pay

## 2020-06-23 DIAGNOSIS — Z01812 Encounter for preprocedural laboratory examination: Secondary | ICD-10-CM | POA: Insufficient documentation

## 2020-06-23 HISTORY — DX: Irritable bowel syndrome, unspecified: K58.9

## 2020-06-23 LAB — URINALYSIS, ROUTINE W REFLEX MICROSCOPIC
Bilirubin Urine: NEGATIVE
Glucose, UA: NEGATIVE mg/dL
Hgb urine dipstick: NEGATIVE
Ketones, ur: NEGATIVE mg/dL
Leukocytes,Ua: NEGATIVE
Nitrite: NEGATIVE
Protein, ur: NEGATIVE mg/dL
Specific Gravity, Urine: 1.021 (ref 1.005–1.030)
pH: 5 (ref 5.0–8.0)

## 2020-06-23 LAB — BASIC METABOLIC PANEL
Anion gap: 9 (ref 5–15)
BUN: 6 mg/dL — ABNORMAL LOW (ref 8–23)
CO2: 24 mmol/L (ref 22–32)
Calcium: 8.8 mg/dL — ABNORMAL LOW (ref 8.9–10.3)
Chloride: 104 mmol/L (ref 98–111)
Creatinine, Ser: 0.79 mg/dL (ref 0.61–1.24)
GFR, Estimated: >60 mL/min (ref >60)
Glucose, Bld: 103 mg/dL — ABNORMAL HIGH (ref 70–99)
Potassium: 4 mmol/L (ref 3.5–5.1)
Sodium: 137 mmol/L (ref 135–145)

## 2020-06-23 LAB — TYPE AND SCREEN
ABO/RH(D): A POS
Antibody Screen: NEGATIVE

## 2020-06-23 LAB — PROTIME-INR
INR: 1 (ref 0.8–1.2)
Prothrombin Time: 12.5 seconds (ref 11.4–15.2)

## 2020-06-23 LAB — CBC
HCT: 43.5 % (ref 39.0–52.0)
Hemoglobin: 15 g/dL (ref 13.0–17.0)
MCH: 31 pg (ref 26.0–34.0)
MCHC: 34.5 g/dL (ref 30.0–36.0)
MCV: 89.9 fL (ref 80.0–100.0)
Platelets: 159 10*3/uL (ref 150–400)
RBC: 4.84 MIL/uL (ref 4.22–5.81)
RDW: 17.3 % — ABNORMAL HIGH (ref 11.5–15.5)
WBC: 4.5 10*3/uL (ref 4.0–10.5)
nRBC: 0 % (ref 0.0–0.2)

## 2020-06-23 LAB — APTT: aPTT: 26 seconds (ref 24–36)

## 2020-06-23 LAB — SURGICAL PCR SCREEN
MRSA, PCR: NEGATIVE
Staphylococcus aureus: NEGATIVE

## 2020-06-23 NOTE — Patient Instructions (Signed)
Your procedure is scheduled on: Monday June 29, 2020 Report to Day Surgery inside [facility] [place] 2nd floor (stop by registration desk first). To find out your arrival time please call [phone] between 1PM - 3PM on Friday June 26, 2020.  Remember: Instructions that are not followed completely may result in serious medical risk,  up to and including death, or upon the discretion of your surgeon and anesthesiologist your  surgery may need to be rescheduled.     _X__ 1. Do not eat food after midnight the night before your procedure.                 No chewing gum or hard candies. You may drink clear liquids up to 2 hours                 before you are scheduled to arrive for your surgery- DO not drink clear                 liquids within 2 hours of the start of your surgery.                 Clear Liquids include:  water, apple juice without pulp, clear Gatorade, G2 or                  Gatorade Zero (avoid Red/Purple/Blue), Black Coffee or Tea (Do not add                 anything to coffee or tea).  __X__2.  On the morning of surgery brush your teeth with toothpaste and water, you                may rinse your mouth with mouthwash if you wish.  Do not swallow any toothpaste of mouthwash.     _X__ 3.  No Alcohol for 24 hours before or after surgery.   _X__ 4.  Do Not Smoke or use e-cigarettes For 24 Hours Prior to Your Surgery.                 Do not use any chewable tobacco products for at least 6 hours prior to                 Surgery.  _X__  5.  Do not use any recreational drugs (marijuana, cocaine, heroin, ecstasy, MDMA or other)                For at least one week prior to your surgery.  Combination of these drugs with anesthesia                May have life threatening results.  __X__ 6.  Notify your doctor if there is any change in your medical condition      (cold, fever, infections).     Do not wear jewelry, make-up, hairpins, clips or nail  polish. Do not wear lotions, powders, or perfumes. You may wear deodorant. Do not shave 48 hours prior to surgery. Men may shave face and neck. Do not bring valuables to the hospital.    [facility] is not responsible for any belongings or valuables.  Contacts, dentures or bridgework may not be worn into surgery. Leave your suitcase in the car. After surgery it may be brought to your room. For patients admitted to the hospital, discharge time is determined by your treatment team.   Patients discharged the day of surgery will not be allowed to drive home.   Make  arrangements for someone to be with you for the first 24 hours of your Same Day Discharge.    Please read over the following fact sheets that you were given:      __X__ Take these medicines the morning of surgery with A SIP OF WATER:    1. oxyCODONE-acetaminophen (PERCOCET) 10-325 MG     ____ Fleet Enema (as directed)   __X__ Use CHG Soap (or wipes) as directed  ____ Use Benzoyl Peroxide Gel as instructed  __X__ Use inhalers on the day of surgery  albuterol (VENTOLIN HFA) 108 (90 Base) MCG/ACT inhaler ____ Stop metformin 2 days prior to surgery    ____ Take 1/2 of usual insulin dose the night before surgery. No insulin the morning          of surgery.   __X__ Stop aspirin EC 81 MG as instructed by your provider.   __X__ Stop Anti-inflammatories such as Ibuprofen, Aleve, Advil, naproxen, and BC powders.    __X__ Stop supplements until after surgery.    __X__ Do not start any herbal supplements before your procedure.    If you have any questions regarding your pre-procedure instructions,  Please call Pre-admit Testing at [phone].

## 2020-06-25 ENCOUNTER — Other Ambulatory Visit: Payer: Self-pay

## 2020-06-25 ENCOUNTER — Other Ambulatory Visit
Admission: RE | Admit: 2020-06-25 | Discharge: 2020-06-25 | Disposition: A | Discharge status: Home / Self Care | Payer: Medicare Other | Source: Ambulatory Visit | Attending: Neurosurgery | Admitting: Neurosurgery

## 2020-06-25 DIAGNOSIS — Z20822 Contact with and (suspected) exposure to covid-19: Secondary | ICD-10-CM | POA: Insufficient documentation

## 2020-06-25 LAB — SARS CORONAVIRUS 2 (TAT 6-24 HRS): SARS Coronavirus 2: NEGATIVE

## 2020-06-28 MED ORDER — CHLORHEXIDINE GLUCONATE 0.12 % MT SOLN: 15.0000 mL | Freq: Once | OROMUCOSAL | Status: AC

## 2020-06-28 MED ORDER — FAMOTIDINE 20 MG PO TABS: 20.0000 mg | ORAL_TABLET | Freq: Once | ORAL | Status: AC

## 2020-06-28 MED ORDER — CEFAZOLIN SODIUM-DEXTROSE 2-4 GM/100ML-% IV SOLN
2.0000 g | INTRAVENOUS | Status: AC
  Administered 2020-06-29: 08:00:00 2 g via INTRAVENOUS

## 2020-06-28 MED ORDER — LACTATED RINGERS IV SOLN: INTRAVENOUS | Status: DC

## 2020-06-28 MED ORDER — ORAL CARE MOUTH RINSE: 15.0000 mL | Freq: Once | OROMUCOSAL | Status: AC

## 2020-06-28 NOTE — Progress Notes (Signed)
Pharmacy Antibiotic Note  Russell Rivas is a 64 y.o. male admitted on (Not on file) with surgical prophylaxis.  Pharmacy has been consulted for cefazolin dosing. TBW = 95.3 kg   Plan: Cefazolin 2 gm IV X 1 60 min pre-op.      No data recorded.  Recent Labs  Lab 06/23/20 0948  WBC 4.5  CREATININE 0.79    Estimated Creatinine Clearance: 100.8 mL/min (by C-G formula based on SCr of 0.79 mg/dL).    Allergies  Allergen Reactions  . Hydrocodone Itching  . Penicillins Nausea And Vomiting and Rash    Did it involve swelling of the face/tongue/throat, SOB, or low BP? No Did it involve sudden or severe rash/hives, skin peeling, or any reaction on the inside of your mouth or nose? No Did you need to seek medical attention at a hospital or doctor's office? No When did it last happen?10 + years If all above answers are "NO", may proceed with cephalosporin use.    Antimicrobials this admission:   >>    >>   Dose adjustments this admission:   Microbiology results:  BCx:  UCx:    Sputum:    MRSA PCR:   Thank you for allowing pharmacy to be a part of this patient's care.  Talana Slatten D 06/28/2020 11:31 PM

## 2020-06-29 ENCOUNTER — Encounter
Admission: RE | Disposition: A | Discharge status: Home / Self Care | Payer: Self-pay | Source: Home / Self Care | Attending: Neurosurgery

## 2020-06-29 ENCOUNTER — Ambulatory Visit: Payer: Medicare Other | Admitting: Certified Registered Nurse Anesthetist

## 2020-06-29 ENCOUNTER — Other Ambulatory Visit: Payer: Self-pay

## 2020-06-29 ENCOUNTER — Encounter: Payer: Self-pay | Admitting: Neurosurgery

## 2020-06-29 ENCOUNTER — Ambulatory Visit: Payer: Medicare Other

## 2020-06-29 ENCOUNTER — Ambulatory Visit
Admission: RE | Admit: 2020-06-29 | Discharge: 2020-06-29 | Disposition: A | Discharge status: Home / Self Care | Payer: Medicare Other | Attending: Neurosurgery | Admitting: Neurosurgery

## 2020-06-29 DIAGNOSIS — Z880 Allergy status to penicillin: Secondary | ICD-10-CM | POA: Insufficient documentation

## 2020-06-29 DIAGNOSIS — M5116 Intervertebral disc disorders with radiculopathy, lumbar region: Secondary | ICD-10-CM | POA: Insufficient documentation

## 2020-06-29 DIAGNOSIS — Z7982 Long term (current) use of aspirin: Secondary | ICD-10-CM | POA: Insufficient documentation

## 2020-06-29 DIAGNOSIS — Z79899 Other long term (current) drug therapy: Secondary | ICD-10-CM | POA: Diagnosis not present

## 2020-06-29 DIAGNOSIS — Z885 Allergy status to narcotic agent status: Secondary | ICD-10-CM | POA: Insufficient documentation

## 2020-06-29 DIAGNOSIS — M48061 Spinal stenosis, lumbar region without neurogenic claudication: Secondary | ICD-10-CM | POA: Diagnosis not present

## 2020-06-29 DIAGNOSIS — F17210 Nicotine dependence, cigarettes, uncomplicated: Secondary | ICD-10-CM | POA: Diagnosis not present

## 2020-06-29 DIAGNOSIS — Z419 Encounter for procedure for purposes other than remedying health state, unspecified: Secondary | ICD-10-CM

## 2020-06-29 HISTORY — PX: LUMBAR LAMINECTOMY/DECOMPRESSION MICRODISCECTOMY: SHX5026

## 2020-06-29 SURGERY — LUMBAR LAMINECTOMY/DECOMPRESSION MICRODISCECTOMY
Anesthesia: General | Laterality: Right

## 2020-06-29 MED ORDER — PHENYLEPHRINE HCL-NACL 10-0.9 MG/250ML-% IV SOLN
INTRAVENOUS | Status: DC | PRN
  Administered 2020-06-29: 25 ug/min via INTRAVENOUS

## 2020-06-29 MED ORDER — MIDAZOLAM HCL 2 MG/2ML IJ SOLN
INTRAMUSCULAR | Status: DC | PRN
  Administered 2020-06-29: 2 mg via INTRAVENOUS

## 2020-06-29 MED ORDER — ACETAMINOPHEN 10 MG/ML IV SOLN
INTRAVENOUS | Status: DC | PRN
  Administered 2020-06-29: 1000 mg via INTRAVENOUS

## 2020-06-29 MED ORDER — ACETAMINOPHEN 10 MG/ML IV SOLN
INTRAVENOUS | Status: AC
  Filled 2020-06-29: qty 100

## 2020-06-29 MED ORDER — BUPIVACAINE-EPINEPHRINE (PF) 0.5% -1:200000 IJ SOLN
INTRAMUSCULAR | Status: AC
  Filled 2020-06-29: qty 30

## 2020-06-29 MED ORDER — FENTANYL CITRATE (PF) 100 MCG/2ML IJ SOLN
INTRAMUSCULAR | Status: AC
  Filled 2020-06-29: qty 2

## 2020-06-29 MED ORDER — OXYCODONE HCL 5 MG PO TABS
ORAL_TABLET | ORAL | Status: AC
  Filled 2020-06-29: qty 1

## 2020-06-29 MED ORDER — ROCURONIUM BROMIDE 10 MG/ML (PF) SYRINGE
PREFILLED_SYRINGE | INTRAVENOUS | Status: AC
  Filled 2020-06-29: qty 10

## 2020-06-29 MED ORDER — THROMBIN 5000 UNITS EX SOLR
CUTANEOUS | Status: DC | PRN
  Administered 2020-06-29: 5000 [IU] via TOPICAL

## 2020-06-29 MED ORDER — LIDOCAINE HCL (CARDIAC) PF 100 MG/5ML IV SOSY
PREFILLED_SYRINGE | INTRAVENOUS | Status: DC | PRN
  Administered 2020-06-29: 100 mg via INTRAVENOUS

## 2020-06-29 MED ORDER — ONDANSETRON HCL 4 MG/2ML IJ SOLN
INTRAMUSCULAR | Status: AC
  Filled 2020-06-29: qty 2

## 2020-06-29 MED ORDER — OXYCODONE HCL 5 MG PO TABS: 5.0000 mg | ORAL_TABLET | ORAL | 0 refills | Status: AC | PRN

## 2020-06-29 MED ORDER — HYDROMORPHONE HCL 1 MG/ML IJ SOLN: 0.5000 mg | INTRAMUSCULAR | Status: DC | PRN

## 2020-06-29 MED ORDER — DEXMEDETOMIDINE HCL 200 MCG/2ML IV SOLN
INTRAVENOUS | Status: DC | PRN
  Administered 2020-06-29: 8 ug via INTRAVENOUS
  Administered 2020-06-29: 12 ug via INTRAVENOUS

## 2020-06-29 MED ORDER — CEFAZOLIN SODIUM-DEXTROSE 2-4 GM/100ML-% IV SOLN
INTRAVENOUS | Status: AC
  Filled 2020-06-29: qty 100

## 2020-06-29 MED ORDER — FENTANYL CITRATE (PF) 100 MCG/2ML IJ SOLN
INTRAMUSCULAR | Status: DC | PRN
  Administered 2020-06-29 (×2): 100 ug via INTRAVENOUS
  Administered 2020-06-29: 50 ug via INTRAVENOUS
  Administered 2020-06-29: 100 ug via INTRAVENOUS

## 2020-06-29 MED ORDER — HYDROMORPHONE HCL 1 MG/ML IJ SOLN
INTRAMUSCULAR | Status: DC | PRN
  Administered 2020-06-29: 1 mg via INTRAVENOUS
  Administered 2020-06-29 (×2): .5 mg via INTRAVENOUS

## 2020-06-29 MED ORDER — FENTANYL CITRATE (PF) 100 MCG/2ML IJ SOLN
25.0000 ug | INTRAMUSCULAR | Status: DC | PRN
  Administered 2020-06-29 (×2): 25 ug via INTRAVENOUS

## 2020-06-29 MED ORDER — CHLORHEXIDINE GLUCONATE 0.12 % MT SOLN
OROMUCOSAL | Status: AC
  Administered 2020-06-29: 07:00:00 15 mL via OROMUCOSAL
  Filled 2020-06-29: qty 15

## 2020-06-29 MED ORDER — BUPIVACAINE-EPINEPHRINE (PF) 0.5% -1:200000 IJ SOLN
INTRAMUSCULAR | Status: DC | PRN
  Administered 2020-06-29: 7 mL via PERINEURAL

## 2020-06-29 MED ORDER — DEXAMETHASONE SODIUM PHOSPHATE 10 MG/ML IJ SOLN
INTRAMUSCULAR | Status: DC | PRN
  Administered 2020-06-29: 10 mg via INTRAVENOUS

## 2020-06-29 MED ORDER — ONDANSETRON HCL 4 MG/2ML IJ SOLN
INTRAMUSCULAR | Status: DC | PRN
  Administered 2020-06-29: 4 mg via INTRAVENOUS

## 2020-06-29 MED ORDER — SUCCINYLCHOLINE CHLORIDE 20 MG/ML IJ SOLN
INTRAMUSCULAR | Status: DC | PRN
  Administered 2020-06-29: 100 mg via INTRAVENOUS

## 2020-06-29 MED ORDER — METHYLPREDNISOLONE ACETATE 40 MG/ML IJ SUSP
INTRAMUSCULAR | Status: DC | PRN
  Administered 2020-06-29: 40 mg

## 2020-06-29 MED ORDER — DEXAMETHASONE SODIUM PHOSPHATE 10 MG/ML IJ SOLN
INTRAMUSCULAR | Status: AC
  Filled 2020-06-29: qty 1

## 2020-06-29 MED ORDER — PHENYLEPHRINE HCL (PRESSORS) 10 MG/ML IV SOLN
INTRAVENOUS | Status: DC | PRN
  Administered 2020-06-29: 50 ug via INTRAVENOUS

## 2020-06-29 MED ORDER — GLYCOPYRROLATE 0.2 MG/ML IJ SOLN
INTRAMUSCULAR | Status: AC
  Filled 2020-06-29: qty 1

## 2020-06-29 MED ORDER — SUCCINYLCHOLINE CHLORIDE 200 MG/10ML IV SOSY
PREFILLED_SYRINGE | INTRAVENOUS | Status: AC
  Filled 2020-06-29: qty 10

## 2020-06-29 MED ORDER — OXYCODONE HCL 5 MG PO TABS: 5.0000 mg | ORAL_TABLET | ORAL | 0 refills | Status: DC | PRN

## 2020-06-29 MED ORDER — PHENYLEPHRINE HCL (PRESSORS) 10 MG/ML IV SOLN
INTRAVENOUS | Status: AC
  Filled 2020-06-29: qty 1

## 2020-06-29 MED ORDER — IPRATROPIUM-ALBUTEROL 0.5-2.5 (3) MG/3ML IN SOLN
3.0000 mL | Freq: Once | RESPIRATORY_TRACT | Status: AC
  Administered 2020-06-29: 3 mL via RESPIRATORY_TRACT

## 2020-06-29 MED ORDER — MIDAZOLAM HCL 2 MG/2ML IJ SOLN
INTRAMUSCULAR | Status: AC
  Filled 2020-06-29: qty 2

## 2020-06-29 MED ORDER — IPRATROPIUM-ALBUTEROL 0.5-2.5 (3) MG/3ML IN SOLN
RESPIRATORY_TRACT | Status: AC
  Filled 2020-06-29: qty 3

## 2020-06-29 MED ORDER — OXYCODONE HCL 5 MG/5ML PO SOLN
5.0000 mg | Freq: Once | ORAL | Status: AC | PRN
Start: 2020-06-29 — End: 2020-06-29

## 2020-06-29 MED ORDER — HYDROMORPHONE HCL 1 MG/ML IJ SOLN
INTRAMUSCULAR | Status: AC
  Filled 2020-06-29: qty 1

## 2020-06-29 MED ORDER — FENTANYL CITRATE (PF) 100 MCG/2ML IJ SOLN
INTRAMUSCULAR | Status: AC
  Administered 2020-06-29: 10:00:00 50 ug via INTRAVENOUS
  Filled 2020-06-29: qty 2

## 2020-06-29 MED ORDER — OXYCODONE HCL 5 MG PO TABS
5.0000 mg | ORAL_TABLET | Freq: Once | ORAL | Status: AC | PRN
  Administered 2020-06-29: 5 mg via ORAL

## 2020-06-29 MED ORDER — ONDANSETRON HCL 4 MG/2ML IJ SOLN: 4.0000 mg | Freq: Once | INTRAMUSCULAR | Status: DC | PRN

## 2020-06-29 MED ORDER — THROMBIN 5000 UNITS EX SOLR
CUTANEOUS | Status: AC
  Filled 2020-06-29: qty 5000

## 2020-06-29 MED ORDER — DEXMEDETOMIDINE (PRECEDEX) IN NS 20 MCG/5ML (4 MCG/ML) IV SYRINGE
PREFILLED_SYRINGE | INTRAVENOUS | Status: AC
  Filled 2020-06-29: qty 5

## 2020-06-29 MED ORDER — PROPOFOL 10 MG/ML IV BOLUS
INTRAVENOUS | Status: AC
  Filled 2020-06-29: qty 40

## 2020-06-29 MED ORDER — PROPOFOL 10 MG/ML IV BOLUS
INTRAVENOUS | Status: DC | PRN
  Administered 2020-06-29: 200 mg via INTRAVENOUS

## 2020-06-29 MED ORDER — METHYLPREDNISOLONE ACETATE 40 MG/ML IJ SUSP
INTRAMUSCULAR | Status: AC
  Filled 2020-06-29: qty 1

## 2020-06-29 MED ORDER — LIDOCAINE HCL (PF) 2 % IJ SOLN
INTRAMUSCULAR | Status: AC
  Filled 2020-06-29: qty 5

## 2020-06-29 MED ORDER — FAMOTIDINE 20 MG PO TABS
ORAL_TABLET | ORAL | Status: AC
  Administered 2020-06-29: 07:00:00 20 mg via ORAL
  Filled 2020-06-29: qty 1

## 2020-06-29 MED ORDER — ACETAMINOPHEN 10 MG/ML IV SOLN: 1000.0000 mg | Freq: Once | INTRAVENOUS | Status: DC | PRN

## 2020-06-29 SURGICAL SUPPLY — 64 items
ADH SKN CLS APL DERMABOND .7 (GAUZE/BANDAGES/DRESSINGS) ×1
AGENT HMST MTR 8 SURGIFLO (HEMOSTASIS) ×1
APL PRP STRL LF DISP 70% ISPRP (MISCELLANEOUS) ×2
APL SRG 60D 8 XTD TIP BNDBL (TIP)
BUR NEURO DRILL SOFT 3.0X3.8M (BURR) ×3 IMPLANT
CANISTER SUCT 1200ML W/VALVE (MISCELLANEOUS) ×6 IMPLANT
CHLORAPREP W/TINT 26 (MISCELLANEOUS) ×6 IMPLANT
COUNTER NEEDLE 20/40 LG (NEEDLE) ×3 IMPLANT
COVER LIGHT HANDLE STERIS (MISCELLANEOUS) ×6 IMPLANT
COVER WAND RF STERILE (DRAPES) ×3 IMPLANT
CUP MEDICINE 2OZ PLAST GRAD ST (MISCELLANEOUS) ×3 IMPLANT
DERMABOND ADVANCED (GAUZE/BANDAGES/DRESSINGS) ×2
DERMABOND ADVANCED .7 DNX12 (GAUZE/BANDAGES/DRESSINGS) ×1 IMPLANT
DRAPE C-ARM 42X72 X-RAY (DRAPES) ×6 IMPLANT
DRAPE LAPAROTOMY 100X77 ABD (DRAPES) ×3 IMPLANT
DRAPE MICROSCOPE SPINE 48X150 (DRAPES) IMPLANT
DRAPE SURG 17X11 SM STRL (DRAPES) ×3 IMPLANT
DRSG TEGADERM 4X4.75 (GAUZE/BANDAGES/DRESSINGS) ×2 IMPLANT
DRSG TELFA 4X3 1S NADH ST (GAUZE/BANDAGES/DRESSINGS) IMPLANT
DURASEAL APPLICATOR TIP (TIP) IMPLANT
DURASEAL SPINE SEALANT 3ML (MISCELLANEOUS) IMPLANT
ELECT CAUTERY BLADE TIP 2.5 (TIP) ×3
ELECT EZSTD 165MM 6.5IN (MISCELLANEOUS) ×3
ELECT REM PT RETURN 9FT ADLT (ELECTROSURGICAL) ×3
ELECTRODE CAUTERY BLDE TIP 2.5 (TIP) ×1 IMPLANT
ELECTRODE EZSTD 165MM 6.5IN (MISCELLANEOUS) ×1 IMPLANT
ELECTRODE REM PT RTRN 9FT ADLT (ELECTROSURGICAL) ×1 IMPLANT
GAUZE SPONGE 4X4 12PLY STRL (GAUZE/BANDAGES/DRESSINGS) ×3 IMPLANT
GLOVE BIOGEL PI IND STRL 7.0 (GLOVE) ×1 IMPLANT
GLOVE BIOGEL PI INDICATOR 7.0 (GLOVE) ×2
GLOVE INDICATOR 8.0 STRL GRN (GLOVE) ×3 IMPLANT
GLOVE SURG SYN 7.0 (GLOVE) ×6 IMPLANT
GLOVE SURG SYN 7.0 PF PI (GLOVE) ×2 IMPLANT
GLOVE SURG SYN 8.0 (GLOVE) ×6 IMPLANT
GLOVE SURG SYN 8.0 PF PI (GLOVE) ×2 IMPLANT
GOWN STRL REUS W/ TWL XL LVL3 (GOWN DISPOSABLE) ×2 IMPLANT
GOWN STRL REUS W/TWL XL LVL3 (GOWN DISPOSABLE) ×6
GRADUATE 1200CC STRL 31836 (MISCELLANEOUS) ×3 IMPLANT
IV CATH ANGIO 12GX3 LT BLUE (NEEDLE) ×3 IMPLANT
KIT TURNOVER KIT A (KITS) ×3 IMPLANT
KIT WILSON FRAME (KITS) ×3 IMPLANT
KNIFE BAYONET SHORT DISCETOMY (MISCELLANEOUS) ×2 IMPLANT
MANIFOLD NEPTUNE II (INSTRUMENTS) ×3 IMPLANT
MARKER SKIN DUAL TIP RULER LAB (MISCELLANEOUS) ×6 IMPLANT
NDL SAFETY ECLIPSE 18X1.5 (NEEDLE) ×1 IMPLANT
NEEDLE HYPO 18GX1.5 SHARP (NEEDLE) ×3
NEEDLE HYPO 22GX1.5 SAFETY (NEEDLE) ×3 IMPLANT
NS IRRIG 1000ML POUR BTL (IV SOLUTION) ×3 IMPLANT
PACK LAMINECTOMY NEURO (CUSTOM PROCEDURE TRAY) ×3 IMPLANT
PAD ARMBOARD 7.5X6 YLW CONV (MISCELLANEOUS) ×3 IMPLANT
SPOGE SURGIFLO 8M (HEMOSTASIS) ×3
SPONGE SURGIFLO 8M (HEMOSTASIS) ×1 IMPLANT
STAPLER SKIN PROX 35W (STAPLE) IMPLANT
SUT NURALON 4 0 TR CR/8 (SUTURE) IMPLANT
SUT POLYSORB 2-0 5X18 GS-10 (SUTURE) ×3 IMPLANT
SUT VIC AB 0 CT1 18XCR BRD 8 (SUTURE) ×1 IMPLANT
SUT VIC AB 0 CT1 8-18 (SUTURE) ×3
SYR 10ML LL (SYRINGE) ×6 IMPLANT
SYR 30ML LL (SYRINGE) ×3 IMPLANT
SYR 3ML LL SCALE MARK (SYRINGE) ×3 IMPLANT
SYR 5ML 18GX1 1/2 (NEEDLE) ×2 IMPLANT
TOWEL OR 17X26 4PK STRL BLUE (TOWEL DISPOSABLE) ×12 IMPLANT
TUBING CONNECTING 10 (TUBING) ×2 IMPLANT
TUBING CONNECTING 10' (TUBING) ×1

## 2020-06-29 NOTE — Transfer of Care (Signed)
Immediate Anesthesia Transfer of Care Note  Patient: Russell Rivas  Procedure(s) Performed: RIGHT L3/4 FAR LATERAL DISCECTOMY (Right )  Patient Location: PACU  Anesthesia Type:General  Level of Consciousness: awake, alert  and oriented  Airway & Oxygen Therapy: Patient Spontanous Breathing and Patient connected to face mask oxygen  Post-op Assessment: Report given to RN and Post -op Vital signs reviewed and stable  Post vital signs: Reviewed and stable  Last Vitals:  Vitals Value Taken Time  BP 154/99 06/29/20 0933  Temp    Pulse 105 06/29/20 0938  Resp 14 06/29/20 0935  SpO2 92 % 06/29/20 0938  Vitals shown include unvalidated device data.  Last Pain:  Vitals:   06/29/20 0651  TempSrc: Oral  PainSc: 0-No pain         Complications: No complications documented.

## 2020-06-29 NOTE — Anesthesia Preprocedure Evaluation (Addendum)
Anesthesia Evaluation  Patient identified by MRN, date of birth, ID band Patient awake    Reviewed: Allergy & Precautions, H&P , NPO status , Patient's Chart, lab work & pertinent test results  History of Anesthesia Complications Negative for: history of anesthetic complications  Airway Mallampati: III  TM Distance: >3 FB Neck ROM: Full    Dental  (+) Chipped, Poor Dentition, Missing, Partial Upper   Pulmonary neg shortness of breath, COPD, Current Smoker and Patient abstained from smoking.,    Pulmonary exam normal breath sounds clear to auscultation       Cardiovascular Exercise Tolerance: Good hypertension, (-) angina+ CAD  (-) DOE  Rhythm:Regular Rate:Normal - Systolic murmurs TTE 1610: 1. The left ventricle has normal systolic function of 96-04%. The cavity  size was normal. Left ventricular diastolic Doppler parameters are  consistent with impaired relaxation.  2. The right ventricle has normal systolic function. The cavity was  normal. There is no increase in right ventricular wall thickness.  3. The mitral valve is normal in structure.  4. The tricuspid valve is normal in structure.  5. The aortic valve is tricuspid.  6. The pulmonic valve was normal in structure.    Neuro/Psych PSYCHIATRIC DISORDERS Anxiety Depression Chronic opioids TIA Neuromuscular disease    GI/Hepatic GERD  Medicated and Controlled,(+)     substance abuse  alcohol use,   Endo/Other  Hypothyroidism   Renal/GU      Musculoskeletal  (+) Arthritis ,   Abdominal   Peds  Hematology negative hematology ROS (+)   Anesthesia Other Findings Past Medical History: No date: Alcohol abuse     Comment:  pt reports drinking at least one pint everyday.  No date: Anxiety No date: Arthritis No date: COPD (chronic obstructive pulmonary disease) (HCC)     Comment:  NO inhalers No date: Depression No date: GERD (gastroesophageal reflux  disease) No date: Hypertension No date: Hypothyroidism     Comment:  does not take medication at this time.  has in the past No date: Polio     Comment:  as a child, caused poblems in right knee.  Past Surgical History: No date: APPENDECTOMY 10/02/2015: CARDIAC CATHETERIZATION; Left     Comment:  Procedure: Left Heart Cath and Coronary Angiography;                Surgeon: Dionisio David, MD;  Location: [place] [facility]               LAB;  Service: Cardiovascular;  Laterality: Left; 12/08/2015: COLONOSCOPY WITH PROPOFOL; N/A     Comment:  Procedure: COLONOSCOPY WITH PROPOFOL;  Surgeon: Lucilla Lame, MD;  Location: [facility] ENDOSCOPY;  Service: Endoscopy;              Laterality: N/A; 09/02/2015: KNEE ARTHROSCOPY; Right     Comment:  Procedure: ARTHROSCOPY KNEE, debridement, microfracture;              Surgeon: Leanor Kail, MD;  Location: [facility] ORS;                Service: Orthopedics;  Laterality: Right; 11/26/2014: KNEE ARTHROSCOPY WITH MEDIAL MENISECTOMY; Right     Comment:  Procedure: KNEE ARTHROSCOPY WITH MEDIAL MENISECTOMY;                Surgeon: Leanor Kail, MD;  Location: [facility] ORS;  Service: Orthopedics;  Laterality: Right; 1959: KNEE LIGAMENT RECONSTRUCTION; Right     Comment:  pt did not have ligaments, ligaments were made and               implanted. 1969: KNEE SURGERY; Left     Comment:  growth bone removed. No date: SHOULDER ARTHROSCOPY; Bilateral     Comment:  one in January and one in June  BMI    Body Mass Index: 31.79 kg/m      Reproductive/Obstetrics negative OB ROS                            Anesthesia Physical  Anesthesia Plan  ASA: III  Anesthesia Plan: General   Post-op Pain Management:    Induction: Intravenous  PONV Risk Score and Plan: 2 and Ondansetron and Dexamethasone  Airway Management Planned: Oral ETT  Additional Equipment: None  Intra-op Plan:   Post-operative Plan: Extubation  in OR  Informed Consent: I have reviewed the patients History and Physical, chart, labs and discussed the procedure including the risks, benefits and alternatives for the proposed anesthesia with the patient or authorized representative who has indicated his/her understanding and acceptance.     Dental Advisory Given  Plan Discussed with: Anesthesiologist, CRNA and Surgeon  Anesthesia Plan Comments: (Discussed risks of anesthesia with patient, including PONV, sore throat, lip/dental damage. Rare risks discussed as well, such as cardiorespiratory and neurological sequelae. Patient understands. Patient counseled on benefits of smoking cessation, and increased perioperative risks associated with continued smoking.  Patient has listed allergy to PCN - rash many years ago; tolerated cefazolin before Severe blistering skin reaction (SJS/TEN)? no Liver or kidney injury caused by PCN? no Hemolytic anemia from PCN? no Drug fever? no Painful swollen joints? no Severe reaction involving inside of mouth, eye, or genital ulcers? no Based on current evidence Alfonse Alpers et al, J Allergy Clin Immunol Pract, 2019), will proceed with cefazolin use: Yes  )      Anesthesia Quick Evaluation

## 2020-06-29 NOTE — Anesthesia Procedure Notes (Signed)
Procedure Name: Intubation Performed by: Demetrius Charity, CRNA Pre-anesthesia Checklist: Patient identified, Patient being monitored, Timeout performed, Emergency Drugs available and Suction available Patient Re-evaluated:Patient Re-evaluated prior to induction Oxygen Delivery Method: Circle system utilized Preoxygenation: Pre-oxygenation with 100% oxygen Induction Type: IV induction Ventilation: Mask ventilation without difficulty Laryngoscope Size: McGraph and 4 Grade View: Grade I Tube type: Oral Tube size: 7.0 mm Number of attempts: 1 Airway Equipment and Method: Stylet Placement Confirmation: ETT inserted through vocal cords under direct vision,  positive ETCO2 and breath sounds checked- equal and bilateral Secured at: 23 cm Tube secured with: Tape Dental Injury: Teeth and Oropharynx as per pre-operative assessment

## 2020-06-29 NOTE — Discharge Instructions (Addendum)
Your surgeon has performed an operation on your lumbar spine (low back) to relieve pressure on one or more nerves. Many times, patients feel better immediately after surgery and can "overdo it." Even if you feel well, it is important that you follow these activity guidelines. If you do not let your back heal properly from the surgery, you can increase the chance of a disc herniation and/or return of your symptoms. The following are instructions to help in your recovery once you have been discharged from the hospital.  * Do not take anti-inflammatory medications for 3 days after surgery (naproxen [Aleve], ibuprofen [Advil, Motrin], celecoxib [Celebrex], etc.)  Activity    No bending, lifting, or twisting ("BLT"). Avoid lifting objects heavier than 10 pounds (gallon milk jug).  Where possible, avoid household activities that involve lifting, bending, pushing, or pulling such as laundry, vacuuming, grocery shopping, and childcare. Try to arrange for help from friends and family for these activities while your back heals.  Increase physical activity slowly as tolerated.  Taking short walks is encouraged, but avoid strenuous exercise. Do not jog, run, bicycle, lift weights, or participate in any other exercises unless specifically allowed by your doctor. Avoid prolonged sitting, including car rides.  Talk to your doctor before resuming sexual activity.  You should not drive until cleared by your doctor.  Until released by your doctor, you should not return to work or school.  You should rest at home and let your body heal.   You may shower two days after your surgery.  After showering, lightly dab your incision dry. Do not take a tub bath or go swimming for 3 weeks, or until approved by your doctor at your follow-up appointment.  If you smoke, we strongly recommend that you quit.  Smoking has been proven to interfere with normal healing in your back and will dramatically reduce the success rate of  your surgery. Please contact [facility] (800-QUIT-NOW) and use the resources at www.[facility].com for assistance in stopping smoking.  Surgical Incision   If you have a dressing on your incision, you may remove it three days after your surgery. Keep your incision area clean and dry.  If you have staples or stitches on your incision, you should have a follow up scheduled for removal. If you do not have staples or stitches, you will have steri-strips (small pieces of surgical tape) or Dermabond glue. The steri-strips/glue should begin to peel away within about a week (it is fine if the steri-strips fall off before then). If the strips are still in place one week after your surgery, you may gently remove them.  Diet            You may return to your usual diet. Be sure to stay hydrated.  When to Contact Us  Although your surgery and recovery will likely be uneventful, you may have some residual numbness, aches, and pains in your back and/or legs. This is normal and should improve in the next few weeks.  However, should you experience any of the following, contact us immediately: . New numbness or weakness . Pain that is progressively getting worse, and is not relieved by your pain medications or rest . Bleeding, redness, swelling, pain, or drainage from surgical incision . Chills or flu-like symptoms . Fever greater than 101.0 F (38.3 C) . Problems with bowel or bladder functions . Difficulty breathing or shortness of breath . Warmth, tenderness, or swelling in your calf  Contact Information . During office hours (Monday-Friday   9 am to 5 pm), please call your physician at [phone] . After hours and weekends, please call [phone] and an answering service will put you in touch with either Dr. Cook or Dr. Yarbrough.  . For a life-threatening emergency, call 911    AMBULATORY SURGERY  DISCHARGE INSTRUCTIONS   1) The drugs that you were given will stay in your system until  tomorrow so for the next 24 hours you should not:  A) Drive an automobile B) Make any legal decisions C) Drink any alcoholic beverage   2) You may resume regular meals tomorrow.  Today it is better to start with liquids and gradually work up to solid foods.  You may eat anything you prefer, but it is better to start with liquids, then soup and crackers, and gradually work up to solid foods.   3) Please notify your doctor immediately if you have any unusual bleeding, trouble breathing, redness and pain at the surgery site, drainage, fever, or pain not relieved by medication.    4) Additional Instructions:        Please contact your physician with any problems or Same Day Surgery at [phone], Monday through Friday 6 am to 4 pm, or [facility] Main number at [phone]. 

## 2020-06-29 NOTE — Discharge Summary (Signed)
Procedure: Far lateral right L3-4 discectomy Procedure date: 06/29/2020 Diagnosis: lumbar radiculopathy    History: Russell Rivas is s/p Far lateral right L3-4 discectomy POD0: Tolerated procedure well. Evaluated in post op recovery still disoriented from anesthesia but able to answer questions and obey commands.   Physical Exam: Vitals:   06/29/20 0651 06/29/20 0933  BP: (!) 156/96   Pulse: 100   Resp: 16 11  Temp: 97.9 F (36.6 C) 98 F (36.7 C)  SpO2: 96%     General: Alert and oriented, lying in bed Strength:5/5 throughout  Sensation: intact and symmetric throughout  Skin: incision clean, dry, intact  Data:  Recent Labs  Lab 06/23/20 0948  NA 137  K 4.0  CL 104  CO2 24  BUN 6*  CREATININE 0.79  GLUCOSE 103*  CALCIUM 8.8*   No results for input(s): AST, ALT, ALKPHOS in the last 168 hours.  Invalid input(s): TBILI   Recent Labs  Lab 06/23/20 0948  WBC 4.5  HGB 15.0  HCT 43.5  PLT 159   Recent Labs  Lab 06/23/20 0948  APTT 26  INR 1.0         Assessment/Plan:  Russell Rivas is POD0 s/p Far lateral right L3-4 discectomy. Once he ambulates, tolerates PO, and urinates, he is able to be discharged to home.  He will follow up in clinic in two weeks.   Lonell Face, NP Department of Neurosurgery

## 2020-06-29 NOTE — Progress Notes (Signed)
sats staying 90-95 since neb treatment, did drop to 89 couple times but came up right away.  Discussed with dr Bertell Maria, ok to move to phase II. Unlabored, breath sounds CTA

## 2020-06-29 NOTE — Progress Notes (Signed)
Patient sats routinely dropping 87-89.  sats increase when pt doing incentive spirometry or taking deep breath.  Is a smoker and has COPD.  Dr Bertell Maria informed and duoneb ordered. Pt is in recliner

## 2020-06-29 NOTE — H&P (Signed)
Russell Rivas is an 64 y.o. male.   Chief Complaint: Right leg pain HPI: Russell Rivas is here for evaluation of ongoing symptoms of right leg pain. He states he has a chronic history of left leg pain but this is not as bothersome. The right leg pain started approximate 2 months ago and goes from the groin area into the anterior thigh. There can be some intermittent numbness in this area. He did go for an injection at the L3-4 foramen and this did not provide significant relief. He has been doing physical therapy exercises at home. He is very active and goes to the gym daily. He has been unable to perform his normal activities lately as the leg will sometimes give out on him due to pain. He has been trying to treat this with medication including oxycodone and the exercises but it has become intolerable. He did go for an MRI of the lumbar spine which showed far alteral right L3/4 disc herniation. He is here for surgery for decomrpession    Past Medical History:  Diagnosis Date  . Alcohol abuse    pt reports drinking at least one pint everyday.   . Anxiety   . Arthritis   . COPD (chronic obstructive pulmonary disease) (HCC)    NO inhalers  . Depression   . GERD (gastroesophageal reflux disease)   . Hypertension   . Hypothyroidism    does not take medication at this time.  has in the past  . IBS (irritable bowel syndrome)   . Polio    as a child, caused poblems in right knee.    Past Surgical History:  Procedure Laterality Date  . APPENDECTOMY    . CARDIAC CATHETERIZATION Left 10/02/2015   Procedure: Left Heart Cath and Coronary Angiography;  Surgeon: Dionisio David, MD;  Location: [facility];  Service: Cardiovascular;  Laterality: Left;  . COLONOSCOPY WITH PROPOFOL N/A 12/08/2015   Procedure: COLONOSCOPY WITH PROPOFOL;  Surgeon: Lucilla Lame, MD;  Location: [facility];  Service: Endoscopy;  Laterality: N/A;  . JOINT REPLACEMENT Right 2021  . KNEE ARTHROPLASTY Right 08/21/2019    Procedure: COMPUTER ASSISTED TOTAL KNEE ARTHROPLASTY;  Surgeon: Dereck Leep, MD;  Location: [facility];  Service: Orthopedics;  Laterality: Right;  . KNEE ARTHROSCOPY Right 09/02/2015   Procedure: ARTHROSCOPY KNEE, debridement, microfracture;  Surgeon: Leanor Kail, MD;  Location: [facility];  Service: Orthopedics;  Laterality: Right;  . KNEE ARTHROSCOPY WITH MEDIAL MENISECTOMY Right 11/26/2014   Procedure: KNEE ARTHROSCOPY WITH MEDIAL MENISECTOMY;  Surgeon: Leanor Kail, MD;  Location: [facility];  Service: Orthopedics;  Laterality: Right;  . KNEE LIGAMENT RECONSTRUCTION Right 1959   pt did not have ligaments, ligaments were made and implanted.  Marland Kitchen KNEE SURGERY Left 1969   growth bone removed.  Marland Kitchen SHOULDER ARTHROSCOPY Bilateral    one in January and one in June    Family History  Problem Relation Age of Onset  . COPD Mother   . Heart disease Father    Social History:  reports that he has been smoking cigarettes. He has a 30.00 pack-year smoking history. He has never used smokeless tobacco. He reports current alcohol use of about 24.0 standard drinks of alcohol per week. He reports that he does not use drugs.  Allergies:  Allergies  Allergen Reactions  . Hydrocodone Itching  . Penicillins Nausea And Vomiting and Rash    Did it involve swelling of the face/tongue/throat, SOB, or low BP? No Did it involve  sudden or severe rash/hives, skin peeling, or any reaction on the inside of your mouth or nose? No Did you need to seek medical attention at a hospital or doctor's office? No When did it last happen?10 + years If all above answers are "NO", may proceed with cephalosporin use.    Medications Prior to Admission  Medication Sig Dispense Refill  . albuterol (VENTOLIN HFA) 108 (90 Base) MCG/ACT inhaler Inhale 2 puffs into the lungs every 6 (six) hours as needed for wheezing or shortness of breath.     . oxyCODONE-acetaminophen (PERCOCET) 10-325 MG tablet Take 1 tablet by mouth  every 8 (eight) hours as needed.    Marland Kitchen acetaminophen (TYLENOL) 500 MG tablet Take 500 mg by mouth every 6 (six) hours as needed for moderate pain or headache.    Marland Kitchen aspirin EC 81 MG tablet Take 81 mg by mouth daily. Swallow whole.    . celecoxib (CELEBREX) 200 MG capsule Take 1 capsule (200 mg total) by mouth 2 (two) times daily. (Patient not taking: Reported on 06/23/2020) 84 capsule 0  . cephALEXin (KEFLEX) 500 MG capsule Take 500 mg by mouth 1 day or 1 dose.    . magnesium oxide (MAG-OX) 400 MG tablet Take 400 mg by mouth daily.     . Multiple Vitamins-Minerals (MULTIVITAMIN WITH MINERALS) tablet Take 1 tablet by mouth daily.     Marland Kitchen oxyCODONE (OXY IR/ROXICODONE) 5 MG immediate release tablet Take 1 tablet (5 mg total) by mouth every 4 (four) hours as needed for moderate pain (pain score 4-6). (Patient not taking: No sig reported) 30 tablet 0  . sucralfate (CARAFATE) 1 g tablet Take 1 tablet (1 g total) by mouth 4 (four) times daily. (Patient not taking: Reported on 06/17/2020) 60 tablet 0  . traMADol (ULTRAM) 50 MG tablet Take 1 tablet (50 mg total) by mouth every 4 (four) hours as needed for moderate pain. (Patient not taking: Reported on 06/17/2020) 30 tablet 0    No results found for this or any previous visit (from the past 48 hour(s)). No results found.  Review of Systems General ROS: Negative Psychological ROS: Negative Ophthalmic ROS: Negative ENT ROS: Negative Hematological and Lymphatic ROS: Negative  Endocrine ROS: Negative Respiratory ROS: Negative Cardiovascular ROS: Negative Gastrointestinal ROS: Negative Genito-Urinary ROS: Negative Musculoskeletal ROS: Positive for back pain Neurological ROS: Positive for bilateral leg pain Dermatological ROS: Negative   There were no vitals taken for this visit. Physical Exam  General appearance: Alert, cooperative, in no acute distress Head: Normocephalic, atraumatic Eyes: Normal, EOM intact Oropharynx: Wearing facemask CV: Regular  rate and rhythm Pulm: Clear to asucultation Back: Tenderness to palpation over the entire lumbar spine Ext: No edema in LE bilaterally, well-healed incisions over the right knee  Neurologic exam:  Mental status: alertness: alert, affect: normal Speech: fluent and clear Motor:strength symmetric 5/5 in bilateral hip flexion, knee flexion, knee extension, dorsiflexion, plantarflexion Sensory: intact to light touch in bilateral lower extremities Gait: Antalgic appearing gait  Imaging: MRI lumbar spine: There is a normal lordotic curvature. There is a mild grade 1 anterior listhesis of L5 on S1. There is mild degenerative disease noted throughout. There is a far lateral disc herniation at L3-4 on the right causing severe stenosis. There is mild central stenosis noted at L4-5 due to a broad-based disc bulge and facet hypertrophy. There is some foraminal stenosis noted L5-S1.   Assessment/Plan -Plan for right L3-4 far lateral discectomy    Deetta Perla, MD 06/29/2020, 6:41 AM

## 2020-06-29 NOTE — Anesthesia Postprocedure Evaluation (Signed)
Anesthesia Post Note  Patient: Russell Rivas  Procedure(s) Performed: RIGHT L3/4 FAR LATERAL DISCECTOMY (Right )  Patient location during evaluation: PACU Anesthesia Type: General Level of consciousness: awake and alert Pain management: pain level controlled Vital Signs Assessment: post-procedure vital signs reviewed and stable Respiratory status: spontaneous breathing, nonlabored ventilation, respiratory function stable and patient connected to nasal cannula oxygen Cardiovascular status: blood pressure returned to baseline and stable Postop Assessment: no apparent nausea or vomiting Anesthetic complications: no   No complications documented.   Last Vitals:  Vitals:   06/29/20 0951 06/29/20 1005  BP: (!) 141/74 (!) 152/74  Pulse: 95 95  Resp: 17 15  Temp:    SpO2: 95% 93%    Last Pain:  Vitals:   06/29/20 1015  TempSrc:   PainSc: 4                  Arita Miss

## 2020-06-29 NOTE — Op Note (Signed)
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 presented to the clinic on 12/7 with ongoing right thigh pain. He underwent conservative management to include OTC medications, steroids, steroid injection, physical therapy, and prescription medications without relief. MRI shows an extruded disc fragment far lateral to the foramen on the right at L3-4. Given his symptoms, a right L3-4 far lateral discectomy was recommended. The risks of surgery were explained to include hematoma, infection, damage to nerve roots, CSF leak, weakness, numbness, pain, need for future surgery, heart attack, and stroke. He elected to proceed with surgery for symptom relief.   Procedure The patient was brought to the OR after informed consent was obtained. He was given general anesthesia and intubated by the anesthesia service. Vascular access lines were placed.The patient was then placed prone on a Wilson frame ensuring all pressure points were padded. A time-out was performed per protocol.   The patient was sterilely prepped and draped. The fluoroscopy unit was used to identify the L3/4 interspace and an incision marked about 5.5 cm off midline at that level on the right. Local anesthetic was instilled. The skin was opened sharply and cautery used to make a fascial opening. The dilator was placed down through the muscle and docked on the right L3 transverse process. This was confirmed by fluoroscopy. The remaining dilators were inserted until 75mm tube was inserted. Fluoroscopy again confirmed adequate placement. The  dilators were removed leaving only the working channel. The microscope was brought into the field.   Next, the muscle was removed from the transverse process as well as the lateral side of the facet at L3-4.  Next, the anterior transversalis muscle and ligament was dissected free from the transverse process and retracted inferiorly and lateral.  Beneath this, epidural fat was seen and the exiting nerve root was exposed.  The L3 nerve root was seen to be full without any obvious undue compression.  The overlying muscle and ligament was removed.  This was followed proximally until it entered into the foramen adjacent to the pedicle.  The disc base was found deep and inferior to this.  The nerve root was retracted and the disc base coagulated.  The space was entered sharply and a combination of curettes and a soft probe was used to remove any small disc fragments.  There was a firm disc protrusion seen and this was impacted beneath the nerve.  Once the nerve appeared adequately free, hemostasis was obtained.  The wound was irrigated profusely.  Depo-Medrol was placed along the nerve root.    The working channel was removed. The fascia was re-approximated with a 0 vicryl.  Next, multiple subcutaneous and dermal layers were closed with 2-0 vicryl until the epidermis was well approximated. The skin was closed with Dermabond.   The patient was returned to supine position and extubated by the anesthesia service. The patient was then taken to the PACU for post-operative care where he was moving extremities symmetrically.     ESTIMATED BLOOD LOSS: 20cc  SPECIMENS None  IMPLANT None   I performed the case in its entiretywith assistance of NP, Christine Zdeb  Deetta Perla, [place]

## 2020-06-29 NOTE — Interval H&P Note (Signed)
History and Physical Interval Note:  06/29/2020 6:43 AM  Russell Rivas  has presented today for surgery, with the diagnosis of m54.16 lumbar radiculopathy.  The various methods of treatment have been discussed with the patient and family. After consideration of risks, benefits and other options for treatment, the patient has consented to  Procedure(s): RIGHT L3/4 FAR LATERAL DISCECTOMY (Right) as a surgical intervention.  The patient's history has been reviewed, patient examined, no change in status, stable for surgery.  I have reviewed the patient's chart and labs.  Questions were answered to the patient's satisfaction.     Deetta Perla

## 2020-07-15 DIAGNOSIS — I10 Essential (primary) hypertension: Secondary | ICD-10-CM | POA: Insufficient documentation

## 2020-07-28 ENCOUNTER — Other Ambulatory Visit: Payer: Self-pay | Admitting: Neurosurgery

## 2020-07-28 DIAGNOSIS — M5441 Lumbago with sciatica, right side: Secondary | ICD-10-CM

## 2020-07-28 DIAGNOSIS — M5416 Radiculopathy, lumbar region: Secondary | ICD-10-CM

## 2020-08-01 ENCOUNTER — Ambulatory Visit
Admission: RE | Admit: 2020-08-01 | Discharge: 2020-08-01 | Disposition: A | Discharge status: Home / Self Care | Payer: Medicare Other | Source: Ambulatory Visit | Attending: Neurosurgery | Admitting: Neurosurgery

## 2020-08-01 ENCOUNTER — Other Ambulatory Visit: Payer: Self-pay

## 2020-08-01 DIAGNOSIS — M5416 Radiculopathy, lumbar region: Secondary | ICD-10-CM | POA: Diagnosis present

## 2020-08-01 DIAGNOSIS — M5441 Lumbago with sciatica, right side: Secondary | ICD-10-CM | POA: Insufficient documentation

## 2020-08-01 MED ORDER — GADOBUTROL 1 MMOL/ML IV SOLN
10.0000 mL | Freq: Once | INTRAVENOUS | Status: AC | PRN
  Administered 2020-08-01: 10 mL via INTRAVENOUS

## 2020-08-19 DIAGNOSIS — U071 COVID-19: Secondary | ICD-10-CM

## 2020-08-19 HISTORY — DX: COVID-19: U07.1

## 2020-09-11 ENCOUNTER — Other Ambulatory Visit: Payer: Self-pay

## 2020-09-14 ENCOUNTER — Encounter: Payer: Self-pay | Admitting: *Deleted

## 2020-09-15 ENCOUNTER — Other Ambulatory Visit: Payer: Self-pay

## 2020-09-15 ENCOUNTER — Ambulatory Visit (INDEPENDENT_AMBULATORY_CARE_PROVIDER_SITE_OTHER): Payer: Medicare Other | Admitting: Gastroenterology

## 2020-09-15 ENCOUNTER — Encounter: Payer: Self-pay | Admitting: Gastroenterology

## 2020-09-15 VITALS — BP 140/87 | HR 94 | Temp 98.3°F | Ht 69.0 in | Wt 201.2 lb

## 2020-09-15 DIAGNOSIS — K2270 Barrett's esophagus without dysplasia: Secondary | ICD-10-CM

## 2020-09-15 DIAGNOSIS — Z1211 Encounter for screening for malignant neoplasm of colon: Secondary | ICD-10-CM

## 2020-09-15 DIAGNOSIS — K219 Gastro-esophageal reflux disease without esophagitis: Secondary | ICD-10-CM

## 2020-09-15 DIAGNOSIS — K625 Hemorrhage of anus and rectum: Secondary | ICD-10-CM

## 2020-09-15 MED ORDER — NA SULFATE-K SULFATE-MG SULF 17.5-3.13-1.6 GM/177ML PO SOLN: ORAL | 0 refills | Status: DC

## 2020-09-15 NOTE — Progress Notes (Signed)
Russell Rivas [address]  [place], [place] [address]  Main: [phone]  Fax: [phone]   Gastroenterology Consultation  Referring Provider:     Baxter Hire, MD Primary Care Physician:  Baxter Hire, MD Reason for Consultation:     Intractable hiccups        HPI:    Chief Complaint  Patient presents with  . Hiccups    Russell Rivas is a 65 y.o. y/o male referred for consultation & management  by Dr. Edwina Barth, Chrystie Nose, MD.  Patient presents with his wife and reports having 1 month history of intractable hiccups, with daily symptoms, multiple times a day, associated with burping, belching and vomiting.  No hematemesis.  This has occurred once before years ago.  Denies any dysphagia.  However, does report heartburn and is on once daily PPI and despite this continues to have heartburn symptoms 3-4 times a week.  No weight loss.  No recent upper endoscopy.  Last colonoscopy was in 2017 with Dr. Allen Norris, with 1 polyp removed and repeat recommended in 5 years.  Also reports bright red blood per rectum 4-5 times a month.  Past Medical History:  Diagnosis Date  . Alcohol abuse    pt reports drinking at least one pint everyday.   . Anxiety   . Arthritis   . COPD (chronic obstructive pulmonary disease) (HCC)    NO inhalers  . Depression   . GERD (gastroesophageal reflux disease)   . Hypertension   . Hypothyroidism    does not take medication at this time.  has in the past  . IBS (irritable bowel syndrome)   . Polio    as a child, caused poblems in right knee.    Past Surgical History:  Procedure Laterality Date  . APPENDECTOMY    . CARDIAC CATHETERIZATION Left 10/02/2015   Procedure: Left Heart Cath and Coronary Angiography;  Surgeon: Dionisio David, MD;  Location: [facility];  Service: Cardiovascular;  Laterality: Left;  . COLONOSCOPY WITH PROPOFOL N/A 12/08/2015   Procedure: COLONOSCOPY WITH PROPOFOL;  Surgeon: Lucilla Lame, MD;   Location: [facility];  Service: Endoscopy;  Laterality: N/A;  . JOINT REPLACEMENT Right 2021  . KNEE ARTHROPLASTY Right 08/21/2019   Procedure: COMPUTER ASSISTED TOTAL KNEE ARTHROPLASTY;  Surgeon: Dereck Leep, MD;  Location: [facility];  Service: Orthopedics;  Laterality: Right;  . KNEE ARTHROSCOPY Right 09/02/2015   Procedure: ARTHROSCOPY KNEE, debridement, microfracture;  Surgeon: Leanor Kail, MD;  Location: [facility];  Service: Orthopedics;  Laterality: Right;  . KNEE ARTHROSCOPY WITH MEDIAL MENISECTOMY Right 11/26/2014   Procedure: KNEE ARTHROSCOPY WITH MEDIAL MENISECTOMY;  Surgeon: Leanor Kail, MD;  Location: [facility];  Service: Orthopedics;  Laterality: Right;  . KNEE LIGAMENT RECONSTRUCTION Right 1959   pt did not have ligaments, ligaments were made and implanted.  Marland Kitchen KNEE SURGERY Left 1969   growth bone removed.  . LUMBAR LAMINECTOMY/DECOMPRESSION MICRODISCECTOMY Right 06/29/2020   Procedure: RIGHT L3/4 FAR LATERAL DISCECTOMY;  Surgeon: Deetta Perla, MD;  Location: [facility];  Service: Neurosurgery;  Laterality: Right;  L3- L4  . SHOULDER ARTHROSCOPY Bilateral    one in January and one in June    Prior to Admission medications   Medication Sig Start Date End Date Taking? Authorizing Provider  acetaminophen (TYLENOL) 500 MG tablet Take 500 mg by mouth every 6 (six) hours as needed for moderate pain or headache.   Yes [provider]  baclofen (  LIORESAL) 10 MG tablet Take 1 tablet by mouth 3 (three) times daily. 09/07/20  Yes [provider]  diltiazem (CARDIZEM CD) 120 MG 24 hr capsule Take 120 mg by mouth daily. 09/09/20  Yes [provider]  fluocinonide (LIDEX) 0.05 % external solution Apply 1 application topically 2 (two) times daily. 08/06/20  Yes [provider]  levothyroxine (SYNTHROID) 25 MCG tablet Take 25 mcg by mouth daily. 07/15/20  Yes [provider]  magnesium oxide (MAG-OX) 400 MG tablet Take 400 mg by mouth daily.    Yes  [provider]  Multiple Vitamins-Minerals (MULTIVITAMIN WITH MINERALS) tablet Take 1 tablet by mouth daily.    Yes [provider]  Na Sulfate-K Sulfate-Mg Sulf 17.5-3.13-1.6 GM/177ML SOLN At 5 PM the day before procedure take 1 bottle and 5 hours before procedure take 1 bottle. 09/15/20  Yes Virgel Manifold, MD  oxyCODONE-acetaminophen (PERCOCET) 10-325 MG tablet Take 1 tablet by mouth every 8 (eight) hours as needed. 06/16/20  Yes [provider]  pantoprazole (PROTONIX) 40 MG tablet Take 40 mg by mouth daily. 07/15/20  Yes [provider]  rosuvastatin (CRESTOR) 20 MG tablet Take 20 mg by mouth daily. 09/09/20  Yes [provider]  sucralfate (CARAFATE) 1 g tablet Take 1 tablet (1 g total) by mouth 4 (four) times daily. 01/09/20  Yes Nance Pear, MD    Family History  Problem Relation Age of Onset  . COPD Mother   . Heart disease Father      Social History   Tobacco Use  . Smoking status: Current Every Day Smoker    Packs/day: 1.00    Years: 30.00    Pack years: 30.00    Types: Cigarettes  . Smokeless tobacco: Never Used  Vaping Use  . Vaping Use: Never used  Substance Use Topics  . Alcohol use: Yes    Alcohol/week: 24.0 standard drinks    Types: 24 Cans of beer per week    Comment: .5 pint liquer per week. Pt reports he has stopped drinking like this in August of 2017  . Drug use: No    Allergies as of 09/15/2020 - Review Complete 09/15/2020  Allergen Reaction Noted  . Hydrocodone Itching 02/27/2015  . Penicillins Nausea And Vomiting and Rash 07/26/2017    Review of Systems:    All systems reviewed and negative except where noted in HPI.   Physical Exam:  BP 140/87   Pulse 94   Temp 98.3 F (36.8 C) (Oral)   Ht 5\' 9"  (1.753 m)   Wt 201 lb 3.2 oz (91.3 kg)   BMI 29.71 kg/m  No LMP for male patient. Psych:  Alert and cooperative. Normal mood and affect. General:   Alert,  Well-developed, well-nourished, pleasant  and cooperative in NAD Head:  Normocephalic and atraumatic. Eyes:  Sclera clear, no icterus.   Conjunctiva pink. Ears:  Normal auditory acuity. Nose:  No deformity, discharge, or lesions. Mouth:  No deformity or lesions,oropharynx pink & moist. Neck:  Supple; no masses or thyromegaly. Abdomen:  Normal bowel sounds.  No bruits.  Soft, non-tender and non-distended without masses, hepatosplenomegaly or hernias noted.  No guarding or rebound tenderness.    Msk:  Symmetrical without gross deformities. Good, equal movement & strength bilaterally. Pulses:  Normal pulses noted. Extremities:  No clubbing or edema.  No cyanosis. Neurologic:  Alert and oriented x3;  grossly normal neurologically. Skin:  Intact without significant lesions or rashes. No jaundice. Lymph Nodes:  No significant cervical adenopathy. Psych:  Alert and cooperative. Normal mood and affect.   Labs: CBC    Component Value Date/Time   WBC 4.5 06/23/2020 0948   RBC 4.84 06/23/2020 0948   HGB 15.0 06/23/2020 0948   HCT 43.5 06/23/2020 0948   PLT 159 06/23/2020 0948   MCV 89.9 06/23/2020 0948   MCH 31.0 06/23/2020 0948   MCHC 34.5 06/23/2020 0948   RDW 17.3 (H) 06/23/2020 0948   LYMPHSABS 1.8 05/23/2020 0921   MONOABS 0.7 05/23/2020 0921   EOSABS 0.1 05/23/2020 0921   BASOSABS 0.0 05/23/2020 0921   CMP     Component Value Date/Time   NA 137 06/23/2020 0948   K 4.0 06/23/2020 0948   CL 104 06/23/2020 0948   CO2 24 06/23/2020 0948   GLUCOSE 103 (H) 06/23/2020 0948   BUN 6 (L) 06/23/2020 0948   CREATININE 0.79 06/23/2020 0948   CALCIUM 8.8 (L) 06/23/2020 0948   PROT 7.9 05/23/2020 0921   ALBUMIN 3.9 05/23/2020 0921   AST 34 05/23/2020 0921   ALT 21 05/23/2020 0921   ALKPHOS 52 05/23/2020 0921   BILITOT 0.7 05/23/2020 0921   GFRNONAA >60 06/23/2020 0948   GFRAA >60 01/09/2020 2113    Imaging Studies: No results found.  Assessment and Plan:   Russell Rivas is a 65 y.o. y/o male has been referred for  hiccups  Patient had persistent hiccups, associated with belching and vomiting for a whole month and has had previous history of similar episodes years ago.  He also has chronic GERD and breakthrough symptoms 3-4 times a week despite PPI therapy  He has more than 1 risk factor to qualify for Barrett's screening.  Given intractable hiccups that previously occurred, and resolved after starting baclofen, would also help [place] rule out any underlying lesions  Repeat colonoscopy was recommended in 5 years after the procedure he had in 2017  I have discussed alternative options, risks & benefits,  which include, but are not limited to, bleeding, infection, perforation,respiratory complication & drug reaction.  The patient agrees with this plan & written consent will be obtained.    Patient educated extensively on acid reflux lifestyle modification, including buying a bed wedge, not eating 3 hrs before bedtime, diet modifications, and handout given for the same.   Given breakthrough symptoms despite Protonix daily, consider change of PPI therapy after upper endoscopy.  Patient states he was previously on Dexilant and unclear why this was changed.  We may elect to change his PPI to another PPI if symptoms continue  Intermittent bright red blood per rectum likely due to underlying internal hemorrhoids.  However, symptoms continue despite patient having soft bowel movements daily without straining.  Assess size of hemorrhoids during colonoscopy  Dr Russell Rivas  Speech recognition software was used to dictate the above note.

## 2020-09-15 NOTE — Patient Instructions (Signed)
Barrett's Esophagus  Barrett's esophagus occurs when the tissue that lines the esophagus changes or becomes damaged. The esophagus is the tube that carries food from the throat to the stomach. With Barrett's esophagus, the cells that line the esophagus are replaced by cells that are similar to the lining of the intestines (intestinal metaplasia). Barrett's esophagus itself may not cause any symptoms. However, many people who have Barrett's esophagus also have gastroesophageal reflux disease (GERD), which may cause symptoms such as heartburn. Over time, a few people with this condition may develop cancer of the esophagus. Treatment may include medicines, procedures to destroy the abnormal cells, or surgery. What are the causes? The exact cause of this condition is not known. In some cases, the condition develops from damage to the lining of the esophagus caused by gastroesophageal reflux disease (GERD). GERD occurs when stomach acids flow up from the stomach into the esophagus. Frequent symptoms of GERD may cause intestinal metaplasia or cause cell changes (dysplasia). What increases the risk? You are more likely to develop this condition if you:  Have GERD.  Are male.  Are of European descent.  Are obese.  Are older than 50.  Have a hiatal hernia. This is a condition in which part of your stomach bulges into your chest.  Smoke. What are the signs or symptoms? People with Barrett's esophagus often have no symptoms. However, many people with this condition also have GERD. Symptoms of GERD may include:  Heartburn.  Difficulty swallowing.  Dry cough. How is this diagnosed? This condition may be diagnosed based on:  Results of an upper gastrointestinal endoscopy. For this exam, a thin, flexible tube with a light and a camera on the end (endoscope) is passed down your esophagus. Your health care provider can view the inside of your esophagus during this procedure.  Results of a biopsy.  For this procedure, several tissue samples are removed (biopsy) from your esophagus to look at under a microscope. They are then checked for intestinal metaplasia or dysplasia. How is this treated? Treatment for this condition may include:  Medicines (proton pump inhibitors, or PPIs) to decrease or stop GERD.  Periodic endoscopic exams to make sure that cancer is not developing.  A procedure or surgery for dysplasia. This may include: ? Removal or destruction of abnormal cells. ? Removal of part of the esophagus. Follow these instructions at home: Eating and drinking  Eat more fruits and vegetables.  Avoid fatty foods.  Eat small, frequent meals instead of large meals.  Avoid foods that cause heartburn. These foods include: ? Coffee and alcoholic drinks. ? Tomatoes and foods made with tomatoes. ? Greasy or spicy foods. ? Chocolate and peppermint.  Do not drink alcohol. General instructions  Take over-the-counter and prescription medicines only as told by your health care provider.  Do not use any products that contain nicotine or tobacco, such as cigarettes, e-cigarettes, and chewing tobacco. If you need help quitting, ask your health care provider.  If you are being treated for GERD, make sure you take medicines and follow all instructions as told by your health care provider.  Keep all follow-up visits as told by your health care provider. This is important. Contact a health care provider if:  You have heartburn or GERD symptoms.  You have difficulty swallowing. Get help right away if:  You have chest pain.  You are unable to swallow.  You vomit blood or material that looks like coffee grounds.  Your stool (feces) is bright red   or dark. These symptoms may represent a serious problem that is an emergency. Do not wait to see if the symptoms will go away. Get medical help right away. Call your local emergency services (911 in the [place] Do not drive yourself to the  hospital. Summary  Barrett's esophagus occurs when the tissue that lines the esophagus changes or becomes damaged.  Barrett's esophagus may be diagnosed with an upper gastrointestinal endoscopy and a biopsy.  Treatment may include medicines, procedures to remove abnormal cells, or surgery.  Follow your health care provider's instructions about what to eat and drink, what medicines to take, and when to call for help. This information is not intended to replace advice given to you by your health care provider. Make sure you discuss any questions you have with your health care provider. Document Revised: 09/14/2019 Document Reviewed: 09/14/2019 Elsevier Patient Education  2021 Elsevier Inc.  

## 2020-09-16 ENCOUNTER — Encounter: Payer: Self-pay | Admitting: Anesthesiology

## 2020-09-16 ENCOUNTER — Telehealth: Payer: Self-pay

## 2020-09-16 NOTE — Telephone Encounter (Signed)
Per Grover Canavan, RN Colonoscopy pt added for Dr. Daphane Shepherd for 3/14. Dr. Clayborn Bigness suggested a cardiac cath at pt's 3/2 visit. Anesthesiology will need a note from Dr Clayborn Bigness that [place] pt for colonoscopy or for pt to have cath prior to procedure here.  I will send Dr. Clayborn Bigness a cardiac clearance form and hopefully we receive it soon.

## 2020-09-17 ENCOUNTER — Other Ambulatory Visit: Payer: Medicare Other | Attending: Gastroenterology

## 2020-09-17 NOTE — Discharge Instructions (Signed)

## 2020-09-18 NOTE — Telephone Encounter (Signed)
Sandy from [facility] stated that the patient will be needing a cath therefore he will have to cancel the upcoming procedure until they let us know when he could have it done. Dr. Bonna Gains will be notified.

## 2020-09-21 ENCOUNTER — Inpatient Hospital Stay: Admission: RE | Admit: 2020-09-21 | Payer: Medicare Other | Source: Ambulatory Visit

## 2020-09-21 ENCOUNTER — Ambulatory Visit: Admission: RE | Admit: 2020-09-21 | Payer: Medicare Other | Source: Home / Self Care | Admitting: Gastroenterology

## 2020-09-21 HISTORY — DX: Transient cerebral ischemic attack, unspecified: G45.9

## 2020-09-21 HISTORY — DX: Presence of dental prosthetic device (complete) (partial): Z97.2

## 2020-09-21 SURGERY — ESOPHAGOGASTRODUODENOSCOPY (EGD) WITH PROPOFOL
Anesthesia: Choice

## 2020-09-23 DIAGNOSIS — R079 Chest pain, unspecified: Secondary | ICD-10-CM

## 2020-09-24 ENCOUNTER — Encounter: Admission: RE | Payer: Self-pay | Source: Home / Self Care

## 2020-09-24 ENCOUNTER — Ambulatory Visit: Admission: RE | Admit: 2020-09-24 | Payer: Medicare Other | Source: Home / Self Care | Admitting: Internal Medicine

## 2020-09-24 SURGERY — LEFT HEART CATH AND CORONARY ANGIOGRAPHY
Anesthesia: Moderate Sedation | Laterality: Left

## 2020-11-19 ENCOUNTER — Other Ambulatory Visit
Admission: RE | Admit: 2020-11-19 | Discharge: 2020-11-19 | Disposition: A | Discharge status: Home / Self Care | Payer: Medicare Other | Source: Ambulatory Visit | Attending: Internal Medicine | Admitting: Internal Medicine

## 2020-11-19 DIAGNOSIS — Z01818 Encounter for other preprocedural examination: Secondary | ICD-10-CM | POA: Insufficient documentation

## 2020-11-19 DIAGNOSIS — Z79899 Other long term (current) drug therapy: Secondary | ICD-10-CM | POA: Diagnosis not present

## 2020-11-19 LAB — BRAIN NATRIURETIC PEPTIDE: B Natriuretic Peptide: 31.3 pg/mL (ref 0.0–100.0)

## 2020-11-21 ENCOUNTER — Emergency Department
Admission: EM | Admit: 2020-11-21 | Discharge: 2020-11-21 | Disposition: A | Discharge status: Home / Self Care | Payer: Medicare Other | Attending: Emergency Medicine | Admitting: Emergency Medicine

## 2020-11-21 ENCOUNTER — Other Ambulatory Visit: Payer: Self-pay

## 2020-11-21 DIAGNOSIS — Z7982 Long term (current) use of aspirin: Secondary | ICD-10-CM | POA: Insufficient documentation

## 2020-11-21 DIAGNOSIS — I2510 Atherosclerotic heart disease of native coronary artery without angina pectoris: Secondary | ICD-10-CM | POA: Diagnosis not present

## 2020-11-21 DIAGNOSIS — Z96651 Presence of right artificial knee joint: Secondary | ICD-10-CM | POA: Insufficient documentation

## 2020-11-21 DIAGNOSIS — I10 Essential (primary) hypertension: Secondary | ICD-10-CM | POA: Insufficient documentation

## 2020-11-21 DIAGNOSIS — F10129 Alcohol abuse with intoxication, unspecified: Secondary | ICD-10-CM | POA: Insufficient documentation

## 2020-11-21 DIAGNOSIS — F10920 Alcohol use, unspecified with intoxication, uncomplicated: Secondary | ICD-10-CM

## 2020-11-21 DIAGNOSIS — Z8616 Personal history of COVID-19: Secondary | ICD-10-CM | POA: Insufficient documentation

## 2020-11-21 DIAGNOSIS — Y908 Blood alcohol level of 240 mg/100 ml or more: Secondary | ICD-10-CM | POA: Diagnosis not present

## 2020-11-21 DIAGNOSIS — F17210 Nicotine dependence, cigarettes, uncomplicated: Secondary | ICD-10-CM | POA: Diagnosis not present

## 2020-11-21 DIAGNOSIS — Z79899 Other long term (current) drug therapy: Secondary | ICD-10-CM | POA: Diagnosis not present

## 2020-11-21 DIAGNOSIS — E039 Hypothyroidism, unspecified: Secondary | ICD-10-CM | POA: Diagnosis not present

## 2020-11-21 LAB — CBC
HCT: 40.5 % (ref 39.0–52.0)
Hemoglobin: 14.5 g/dL (ref 13.0–17.0)
MCH: 33.3 pg (ref 26.0–34.0)
MCHC: 35.8 g/dL (ref 30.0–36.0)
MCV: 93.1 fL (ref 80.0–100.0)
Platelets: 125 10*3/uL — ABNORMAL LOW (ref 150–400)
RBC: 4.35 MIL/uL (ref 4.22–5.81)
RDW: 13.5 % (ref 11.5–15.5)
WBC: 5 10*3/uL (ref 4.0–10.5)
nRBC: 0 % (ref 0.0–0.2)

## 2020-11-21 LAB — COMPREHENSIVE METABOLIC PANEL
ALT: 24 U/L (ref 0–44)
AST: 56 U/L — ABNORMAL HIGH (ref 15–41)
Albumin: 3.7 g/dL (ref 3.5–5.0)
Alkaline Phosphatase: 56 U/L (ref 38–126)
Anion gap: 7 (ref 5–15)
BUN: 9 mg/dL (ref 8–23)
CO2: 27 mmol/L (ref 22–32)
Calcium: 8.4 mg/dL — ABNORMAL LOW (ref 8.9–10.3)
Chloride: 105 mmol/L (ref 98–111)
Creatinine, Ser: 0.76 mg/dL (ref 0.61–1.24)
GFR, Estimated: >60 mL/min (ref >60)
Glucose, Bld: 99 mg/dL (ref 70–99)
Potassium: 3.6 mmol/L (ref 3.5–5.1)
Sodium: 139 mmol/L (ref 135–145)
Total Bilirubin: 0.6 mg/dL (ref 0.3–1.2)
Total Protein: 7 g/dL (ref 6.5–8.1)

## 2020-11-21 LAB — ETHANOL: Alcohol, Ethyl (B): 384 mg/dL (ref <10)

## 2020-11-21 NOTE — ED Notes (Signed)
Blood drawn and sent to lab.

## 2020-11-21 NOTE — ED Triage Notes (Signed)
Pt arrives with EMS after drinking vodka and beer.  Pt admits to drinking daily.  Denies SI.

## 2020-11-21 NOTE — Discharge Instructions (Addendum)
Please seek medical attention for any high fevers, chest pain, shortness of breath, change in behavior, persistent vomiting, bloody stool or any other new or concerning symptoms.  

## 2020-11-21 NOTE — ED Provider Notes (Signed)
[facility] Emergency Department Provider Note  ____________________________________________   I have reviewed the triage vital signs and the nursing notes.   HISTORY  Chief Complaint Alcohol Intoxication   History limited by: Not Limited   HPI Russell Rivas is a 65 y.o. male who presents to the emergency department today because of concern that he might have overdosed on alcohol. The patient states that he drank half a pint of peach brandy today. He then started feeling weird and started having a hard time walking. The patient was concerned that he might have drunk too much so he called 911. At the time of my exam he is feeling better.   Records reviewed. Per medical record review patient has a history of alcohol abuse and ED visits for alcohol intoxication although those were a number of years ago.   Past Medical History:  Diagnosis Date  . Alcohol abuse    pt reports drinking at least one pint everyday.   . Anxiety   . Arthritis   . COPD (chronic obstructive pulmonary disease) (HCC)    NO inhalers  . COVID-19 08/19/2020  . Depression   . GERD (gastroesophageal reflux disease)   . Hypertension   . Hypothyroidism    does not take medication at this time.  has in the past  . IBS (irritable bowel syndrome)   . Polio    as a child, caused poblems in right knee.  Marland Kitchen TIA (transient ischemic attack)    no deficits  . Wears dentures    partial upper    Patient Active Problem List   Diagnosis Date Noted  . Hypertension, essential 07/15/2020  . Acute bilateral low back pain with bilateral sciatica 05/12/2020  . Right leg weakness 05/12/2020  . Irritable bowel syndrome 02/26/2020  . Total knee replacement status 08/21/2019  . Status post left rotator cuff repair 11/30/2018  . Depression, prolonged 08/28/2018  . Hypothyroidism 08/28/2018  . TIA (transient ischemic attack) 08/20/2018  . COPD mixed type ([place]) 04/26/2017  . Coronary artery disease,  non-occlusive 04/26/2017  . Tobacco abuse 04/26/2017  . Primary osteoarthritis of right knee 05/30/2016  . Blood in stool   . Rectal polyp   . First degree hemorrhoids   . Increased MCV 06/17/2015  . Current tear of meniscus 12/05/2014  . 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 Lumbar radiculopathy 04/15/2013  . Complete rotator cuff rupture of left shoulder 09/04/2012    Past Surgical History:  Procedure Laterality Date  . APPENDECTOMY    . CARDIAC CATHETERIZATION Left 10/02/2015   Procedure: Left Heart Cath and Coronary Angiography;  Surgeon: Dionisio David, MD;  Location: [facility];  Service: Cardiovascular;  Laterality: Left;  . COLONOSCOPY WITH PROPOFOL N/A 12/08/2015   Procedure: COLONOSCOPY WITH PROPOFOL;  Surgeon: Lucilla Lame, MD;  Location: [facility];  Service: Endoscopy;  Laterality: N/A;  . JOINT REPLACEMENT Right 2021  . KNEE ARTHROPLASTY Right 08/21/2019   Procedure: COMPUTER ASSISTED TOTAL KNEE ARTHROPLASTY;  Surgeon: Dereck Leep, MD;  Location: [facility];  Service: Orthopedics;  Laterality: Right;  . KNEE ARTHROSCOPY Right 09/02/2015   Procedure: ARTHROSCOPY KNEE, debridement, microfracture;  Surgeon: Leanor Kail, MD;  Location: [facility];  Service: Orthopedics;  Laterality: Right;  . KNEE ARTHROSCOPY WITH MEDIAL MENISECTOMY Right 11/26/2014   Procedure: KNEE ARTHROSCOPY WITH MEDIAL MENISECTOMY;  Surgeon: Leanor Kail, MD;  Location: [facility];  Service: Orthopedics;  Laterality: Right;  . KNEE LIGAMENT RECONSTRUCTION Right 1959   pt  did not have ligaments, ligaments were made and implanted.  Marland Kitchen KNEE SURGERY Left 1969   growth bone removed.  . LUMBAR LAMINECTOMY/DECOMPRESSION MICRODISCECTOMY Right 06/29/2020   Procedure: RIGHT L3/4 FAR LATERAL DISCECTOMY;  Surgeon: Deetta Perla, MD;  Location: [facility];  Service: Neurosurgery;  Laterality: Right;  L3- L4  . SHOULDER ARTHROSCOPY Bilateral    one in January and one in June    Prior to Admission  medications   Medication Sig Start Date End Date Taking? Authorizing Provider  acetaminophen (TYLENOL) 500 MG tablet Take 500 mg by mouth every 6 (six) hours as needed for moderate pain or headache.    [provider]  aspirin EC 81 MG tablet Take 81 mg by mouth daily. Swallow whole.    [provider]  diltiazem (CARDIZEM CD) 120 MG 24 hr capsule Take 120 mg by mouth in the morning. 09/09/20   [provider]  levothyroxine (SYNTHROID) 25 MCG tablet Take 25 mcg by mouth daily before breakfast. 30 minutes before eating 07/15/20   [provider]  magnesium oxide (MAG-OX) 400 MG tablet Take 400 mg by mouth daily.     [provider]  Multiple Vitamins-Minerals (MULTIVITAMIN WITH MINERALS) tablet Take 1 tablet by mouth daily.     [provider]  Na Sulfate-K Sulfate-Mg Sulf 17.5-3.13-1.6 GM/177ML SOLN At 5 PM the day before procedure take 1 bottle and 5 hours before procedure take 1 bottle. 09/15/20   Virgel Manifold, MD  pantoprazole (PROTONIX) 40 MG tablet Take 40 mg by mouth in the morning. 07/15/20   [provider]  rosuvastatin (CRESTOR) 20 MG tablet Take 20 mg by mouth every evening. 09/09/20   [provider]    Allergies Hydrocodone and Penicillins  Family History  Problem Relation Age of Onset  . COPD Mother   . Heart disease Father     Social History Social History   Tobacco Use  . Smoking status: Current Every Day Smoker    Packs/day: 1.00    Years: 35.00    Pack years: 35.00    Types: Cigarettes  . Smokeless tobacco: Never Used  . Tobacco comment: started 1987  Vaping Use  . Vaping Use: Never used  Substance Use Topics  . Alcohol use: Yes    Alcohol/week: 35.0 - 42.0 standard drinks    Types: 35 - 42 Shots of liquor per week    Comment: (09/15/20 - pt reports 5-7 shots of liquor per day) .5 pint liquer per week. Pt reports he has stopped drinking like this in August of 2017  . Drug use: No     Review of Systems Constitutional: No fever/chills Eyes: No visual changes. ENT: No sore throat. Cardiovascular: Denies chest pain. Respiratory: Denies shortness of breath. Gastrointestinal: No abdominal pain.  No nausea, no vomiting.  No diarrhea.   Genitourinary: Negative for dysuria. Musculoskeletal: Negative for back pain. Skin: Negative for rash. Neurological: Positive for difficulty with ambulation.  ____________________________________________   PHYSICAL EXAM:  VITAL SIGNS: ED Triage Vitals  Enc Vitals Group     BP 11/21/20 1653 (!) 140/92     Pulse Rate 11/21/20 1653 81     Resp 11/21/20 1653 18     Temp 11/21/20 1653 97.9 F (36.6 C)     Temp Source 11/21/20 1653 Oral     SpO2 11/21/20 1653 96 %     Weight 11/21/20 1718 200 lb (90.7 kg)     Height 11/21/20 1718 5\' 6"  (1.676  m)     Head Circumference --      Peak Flow --      Pain Score 11/21/20 1654 0   Constitutional: Alert and oriented.  Eyes: Conjunctivae are normal.  ENT      Head: Normocephalic and atraumatic.      Nose: No congestion/rhinnorhea.      Mouth/Throat: Mucous membranes are moist.      Neck: No stridor. Hematological/Lymphatic/Immunilogical: No cervical lymphadenopathy. Cardiovascular: Normal rate, regular rhythm.  No murmurs, rubs, or gallops.  Respiratory: Normal respiratory effort without tachypnea nor retractions. Breath sounds are clear and equal bilaterally. No wheezes/rales/rhonchi. Gastrointestinal: Soft and non tender. No rebound. No guarding.  Genitourinary: Deferred Musculoskeletal: Normal range of motion in all extremities. No lower extremity edema. Neurologic:  Normal speech and language. No gross focal neurologic deficits are appreciated.  Skin:  Skin is warm, dry and intact. No rash noted. Psychiatric: Mood and affect are normal. Speech and behavior are normal. Patient exhibits appropriate insight and judgment.  ____________________________________________    LABS  (pertinent positives/negatives)  CBC wbc 5.0, hgb 14.5, plt 125 CMP wnl except ca 8.4, ast 56 ____________________________________________   EKG  None  ____________________________________________    RADIOLOGY  None  ____________________________________________   PROCEDURES  Procedures  ____________________________________________   INITIAL IMPRESSION / ASSESSMENT AND PLAN / ED COURSE  Pertinent labs & imaging results that were available during my care of the patient were reviewed by me and considered in my medical decision making (see chart for details).   Patient presented to the emergency department today because of concern for alcohol intoxication. He states he drank half a pint of peach brandy and then felt off and was having a hard time walking. On exam patient states he feels better. Blood work without concerning electrolyte abnormality. Patient does state he has someone who can come pick him up. At this time I do think discharge is reasonable. Will give patient RTS information.  ____________________________________________   FINAL CLINICAL IMPRESSION(S) / ED DIAGNOSES  Final diagnoses:  Alcoholic intoxication without complication ([place])     Note: This dictation was prepared with Dragon dictation. Any transcriptional errors that result from this process are unintentional     Nance Pear, MD 11/21/20 [id number]

## 2020-12-10 ENCOUNTER — Encounter
Admission: RE | Disposition: A | Discharge status: Home / Self Care | Payer: Self-pay | Source: Home / Self Care | Attending: Internal Medicine

## 2020-12-10 ENCOUNTER — Other Ambulatory Visit: Payer: Self-pay

## 2020-12-10 ENCOUNTER — Ambulatory Visit
Admission: RE | Admit: 2020-12-10 | Discharge: 2020-12-10 | Disposition: A | Discharge status: Home / Self Care | Payer: Medicare Other | Attending: Internal Medicine | Admitting: Internal Medicine

## 2020-12-10 ENCOUNTER — Encounter: Payer: Self-pay | Admitting: Internal Medicine

## 2020-12-10 DIAGNOSIS — I2510 Atherosclerotic heart disease of native coronary artery without angina pectoris: Secondary | ICD-10-CM | POA: Diagnosis not present

## 2020-12-10 DIAGNOSIS — I200 Unstable angina: Secondary | ICD-10-CM

## 2020-12-10 HISTORY — PX: LEFT HEART CATH AND CORONARY ANGIOGRAPHY: CATH118249

## 2020-12-10 SURGERY — LEFT HEART CATH AND CORONARY ANGIOGRAPHY
Anesthesia: Moderate Sedation | Laterality: Left

## 2020-12-10 MED ORDER — SODIUM CHLORIDE 0.9 % WEIGHT BASED INFUSION: 1.0000 mL/kg/h | INTRAVENOUS | Status: DC

## 2020-12-10 MED ORDER — VERAPAMIL HCL 2.5 MG/ML IV SOLN
INTRAVENOUS | Status: AC
  Filled 2020-12-10: qty 2

## 2020-12-10 MED ORDER — MIDAZOLAM HCL 2 MG/2ML IJ SOLN
INTRAMUSCULAR | Status: DC | PRN
  Administered 2020-12-10 (×2): 1 mg via INTRAVENOUS

## 2020-12-10 MED ORDER — MIDAZOLAM HCL 2 MG/2ML IJ SOLN
INTRAMUSCULAR | Status: AC
  Filled 2020-12-10: qty 2

## 2020-12-10 MED ORDER — FENTANYL CITRATE (PF) 100 MCG/2ML IJ SOLN
INTRAMUSCULAR | Status: DC | PRN
  Administered 2020-12-10 (×2): 25 ug via INTRAVENOUS

## 2020-12-10 MED ORDER — SODIUM CHLORIDE 0.9% FLUSH: 3.0000 mL | Freq: Two times a day (BID) | INTRAVENOUS | Status: DC

## 2020-12-10 MED ORDER — SODIUM CHLORIDE 0.9 % IV SOLN: 250.0000 mL | INTRAVENOUS | Status: DC | PRN

## 2020-12-10 MED ORDER — SODIUM CHLORIDE 0.9 % WEIGHT BASED INFUSION
270.0000 mL/h | INTRAVENOUS | Status: AC
  Administered 2020-12-10: 3 mL/kg/h via INTRAVENOUS

## 2020-12-10 MED ORDER — SODIUM CHLORIDE 0.9% FLUSH: 3.0000 mL | INTRAVENOUS | Status: DC | PRN

## 2020-12-10 MED ORDER — IOHEXOL 300 MG/ML  SOLN
INTRAMUSCULAR | Status: DC | PRN
  Administered 2020-12-10: 72 mL

## 2020-12-10 MED ORDER — HEPARIN (PORCINE) IN NACL 2000-0.9 UNIT/L-% IV SOLN
INTRAVENOUS | Status: DC | PRN
  Administered 2020-12-10: 1000 mL

## 2020-12-10 MED ORDER — ASPIRIN 81 MG PO CHEW: 81.0000 mg | CHEWABLE_TABLET | ORAL | Status: DC

## 2020-12-10 MED ORDER — HEPARIN (PORCINE) IN NACL 1000-0.9 UT/500ML-% IV SOLN
INTRAVENOUS | Status: AC
  Filled 2020-12-10: qty 1000

## 2020-12-10 MED ORDER — HEPARIN SODIUM (PORCINE) 1000 UNIT/ML IJ SOLN
INTRAMUSCULAR | Status: AC
  Filled 2020-12-10: qty 1

## 2020-12-10 MED ORDER — FENTANYL CITRATE (PF) 100 MCG/2ML IJ SOLN
INTRAMUSCULAR | Status: AC
  Filled 2020-12-10: qty 2

## 2020-12-10 MED ORDER — HEPARIN SODIUM (PORCINE) 1000 UNIT/ML IJ SOLN
INTRAMUSCULAR | Status: DC | PRN
  Administered 2020-12-10: 4500 [IU] via INTRAVENOUS

## 2020-12-10 SURGICAL SUPPLY — 10 items
CATH INFINITI 5 FR JL3.5 (CATHETERS) ×1 IMPLANT
CATH INFINITI JR4 5F (CATHETERS) ×1 IMPLANT
DEVICE RAD TR BAND REGULAR (VASCULAR PRODUCTS) ×1 IMPLANT
GLIDESHEATH SLEND SS 6F .021 (SHEATH) ×1 IMPLANT
GUIDEWIRE INQWIRE 1.5J.035X260 (WIRE) IMPLANT
INQWIRE 1.5J .035X260CM (WIRE) ×2
PACK CARDIAC CATH (CUSTOM PROCEDURE TRAY) ×2 IMPLANT
PROTECTION STATION PRESSURIZED (MISCELLANEOUS) ×2
SET ATX SIMPLICITY (MISCELLANEOUS) ×1 IMPLANT
STATION PROTECTION PRESSURIZED (MISCELLANEOUS) IMPLANT

## 2020-12-10 NOTE — Progress Notes (Signed)
Dr. Clayborn Bigness at bedside, speaking with pt. And his spouse re: cath results. Both verbalize understanding of conversation.

## 2020-12-17 NOTE — Telephone Encounter (Signed)
Russell Rivas, patient will be coming to see Dr. Bonna Gains for a follow up on June 14th. He had a procedure done with Dr. Clayborn Bigness on the beginning of June. Please see what Dr. Bonna Gains recommends on this visit and maybe you would be able to request and obtain cardiac clearance for the patient to schedule his colonoscopy. Thank you.

## 2020-12-18 NOTE — Telephone Encounter (Signed)
Russell Rivas will take over once patient is seen.

## 2020-12-21 ENCOUNTER — Telehealth: Payer: Self-pay | Admitting: Gastroenterology

## 2020-12-21 NOTE — Telephone Encounter (Signed)
Fax received for Pre Op clearance from Dr Lujean Amel 12/15/2020....   Reports patient is at mild/moderate risk for cardiovascular complications from his upcoming procedure. It is recommended that the patient continue his BETA BLOCKER (if on one) preoperatively including taking it with a small sip of water the morning of surgery. It is also recommended diligent hemodynamic and cardiopulmonary monitoring.   Pre op clearance sent to be scanned into chart

## 2020-12-21 NOTE — Telephone Encounter (Signed)
Call to schedule Colonoscopy

## 2020-12-21 NOTE — Telephone Encounter (Signed)
Pt canceled appt for tomorrow...Marland Kitchen please advise if appropriate to schedule procedure

## 2020-12-21 NOTE — Telephone Encounter (Signed)
Form faxed requesting procedure clearance to Dr Karma Greaser D. [place] at [facility]

## 2020-12-22 ENCOUNTER — Ambulatory Visit: Payer: Medicare Other | Admitting: Gastroenterology

## 2020-12-22 ENCOUNTER — Other Ambulatory Visit: Payer: Self-pay

## 2020-12-22 DIAGNOSIS — K2270 Barrett's esophagus without dysplasia: Secondary | ICD-10-CM

## 2020-12-22 DIAGNOSIS — K219 Gastro-esophageal reflux disease without esophagitis: Secondary | ICD-10-CM

## 2020-12-22 NOTE — Telephone Encounter (Signed)
I spoke to pt and scheduled for colon/EGD for June 22 at [facility]

## 2020-12-22 NOTE — Telephone Encounter (Signed)
Patient wife called and states that she is ready to scheduled the colonoscopy for her husband

## 2020-12-29 ENCOUNTER — Encounter: Payer: Self-pay | Admitting: Gastroenterology

## 2020-12-30 ENCOUNTER — Ambulatory Visit: Payer: Medicare Other | Admitting: Anesthesiology

## 2020-12-30 ENCOUNTER — Ambulatory Visit
Admission: RE | Admit: 2020-12-30 | Discharge: 2020-12-30 | Disposition: A | Discharge status: Home / Self Care | Payer: Medicare Other | Attending: Gastroenterology | Admitting: Gastroenterology

## 2020-12-30 ENCOUNTER — Encounter
Admission: RE | Disposition: A | Discharge status: Home / Self Care | Payer: Self-pay | Source: Home / Self Care | Attending: Gastroenterology

## 2020-12-30 ENCOUNTER — Encounter: Payer: Self-pay | Admitting: Gastroenterology

## 2020-12-30 DIAGNOSIS — D120 Benign neoplasm of cecum: Secondary | ICD-10-CM | POA: Insufficient documentation

## 2020-12-30 DIAGNOSIS — K648 Other hemorrhoids: Secondary | ICD-10-CM | POA: Insufficient documentation

## 2020-12-30 DIAGNOSIS — R066 Hiccough: Secondary | ICD-10-CM | POA: Diagnosis not present

## 2020-12-30 DIAGNOSIS — Z8612 Personal history of poliomyelitis: Secondary | ICD-10-CM | POA: Insufficient documentation

## 2020-12-30 DIAGNOSIS — K5730 Diverticulosis of large intestine without perforation or abscess without bleeding: Secondary | ICD-10-CM | POA: Insufficient documentation

## 2020-12-30 DIAGNOSIS — Z7982 Long term (current) use of aspirin: Secondary | ICD-10-CM | POA: Insufficient documentation

## 2020-12-30 DIAGNOSIS — K222 Esophageal obstruction: Secondary | ICD-10-CM | POA: Insufficient documentation

## 2020-12-30 DIAGNOSIS — K449 Diaphragmatic hernia without obstruction or gangrene: Secondary | ICD-10-CM | POA: Insufficient documentation

## 2020-12-30 DIAGNOSIS — Z79899 Other long term (current) drug therapy: Secondary | ICD-10-CM | POA: Insufficient documentation

## 2020-12-30 DIAGNOSIS — K2270 Barrett's esophagus without dysplasia: Secondary | ICD-10-CM

## 2020-12-30 DIAGNOSIS — K635 Polyp of colon: Secondary | ICD-10-CM

## 2020-12-30 DIAGNOSIS — Z8673 Personal history of transient ischemic attack (TIA), and cerebral infarction without residual deficits: Secondary | ICD-10-CM | POA: Insufficient documentation

## 2020-12-30 DIAGNOSIS — Z96651 Presence of right artificial knee joint: Secondary | ICD-10-CM | POA: Insufficient documentation

## 2020-12-30 DIAGNOSIS — K219 Gastro-esophageal reflux disease without esophagitis: Secondary | ICD-10-CM

## 2020-12-30 DIAGNOSIS — K2100 Gastro-esophageal reflux disease with esophagitis, without bleeding: Secondary | ICD-10-CM | POA: Diagnosis not present

## 2020-12-30 DIAGNOSIS — K3189 Other diseases of stomach and duodenum: Secondary | ICD-10-CM | POA: Diagnosis not present

## 2020-12-30 DIAGNOSIS — Z8616 Personal history of COVID-19: Secondary | ICD-10-CM | POA: Insufficient documentation

## 2020-12-30 DIAGNOSIS — F17210 Nicotine dependence, cigarettes, uncomplicated: Secondary | ICD-10-CM | POA: Diagnosis not present

## 2020-12-30 DIAGNOSIS — Z1211 Encounter for screening for malignant neoplasm of colon: Secondary | ICD-10-CM | POA: Diagnosis not present

## 2020-12-30 DIAGNOSIS — Z79890 Hormone replacement therapy (postmenopausal): Secondary | ICD-10-CM | POA: Insufficient documentation

## 2020-12-30 DIAGNOSIS — K269 Duodenal ulcer, unspecified as acute or chronic, without hemorrhage or perforation: Secondary | ICD-10-CM | POA: Diagnosis not present

## 2020-12-30 DIAGNOSIS — Z13810 Encounter for screening for upper gastrointestinal disorder: Secondary | ICD-10-CM | POA: Insufficient documentation

## 2020-12-30 HISTORY — PX: COLONOSCOPY WITH PROPOFOL: SHX5780

## 2020-12-30 HISTORY — PX: ESOPHAGOGASTRODUODENOSCOPY: SHX5428

## 2020-12-30 SURGERY — COLONOSCOPY WITH PROPOFOL
Anesthesia: General

## 2020-12-30 MED ORDER — SODIUM CHLORIDE 0.9 % IV SOLN
INTRAVENOUS | Status: DC
Start: 2020-12-30 — End: 2020-12-30

## 2020-12-30 MED ORDER — LIDOCAINE HCL (CARDIAC) PF 100 MG/5ML IV SOSY
PREFILLED_SYRINGE | INTRAVENOUS | Status: DC | PRN
  Administered 2020-12-30: 40 mg via INTRAVENOUS

## 2020-12-30 MED ORDER — LIDOCAINE HCL (PF) 2 % IJ SOLN
INTRAMUSCULAR | Status: AC
  Filled 2020-12-30: qty 2

## 2020-12-30 MED ORDER — PANTOPRAZOLE SODIUM 40 MG PO TBEC: 40.0000 mg | DELAYED_RELEASE_TABLET | Freq: Two times a day (BID) | ORAL | 0 refills | Status: DC

## 2020-12-30 MED ORDER — PROPOFOL 500 MG/50ML IV EMUL
INTRAVENOUS | Status: AC
  Filled 2020-12-30: qty 50

## 2020-12-30 MED ORDER — PROPOFOL 10 MG/ML IV BOLUS
INTRAVENOUS | Status: DC | PRN
  Administered 2020-12-30: 20 mg via INTRAVENOUS
  Administered 2020-12-30: 80 mg via INTRAVENOUS
  Administered 2020-12-30: 50 mg via INTRAVENOUS

## 2020-12-30 MED ORDER — PROPOFOL 10 MG/ML IV BOLUS
INTRAVENOUS | Status: AC
  Filled 2020-12-30: qty 20

## 2020-12-30 MED ORDER — PROPOFOL 500 MG/50ML IV EMUL
INTRAVENOUS | Status: DC | PRN
  Administered 2020-12-30: 175 ug/kg/min via INTRAVENOUS

## 2020-12-30 NOTE — H&P (Signed)
Vonda Antigua, MD [address], [place], [place], [place], [address] [id number], [place], [place], [place], [address] Phone: [phone]  Fax: [phone]  Primary Care Physician:  Baxter Hire, MD   Pre-Procedure History & Physical: HPI:  Russell Rivas is a 65 y.o. male is here for a colonoscopy and EGD.   Past Medical History:  Diagnosis Date   Alcohol abuse    pt reports drinking at least one pint everyday.    Anxiety    Arthritis    COPD (chronic obstructive pulmonary disease) (HCC)    NO inhalers   COVID-19 08/19/2020   Depression    GERD (gastroesophageal reflux disease)    Hypertension    Hypothyroidism    does not take medication at this time.  has in the past   IBS (irritable bowel syndrome)    Polio    as a child, caused poblems in right knee.   TIA (transient ischemic attack)    no deficits   Wears dentures    partial upper    Past Surgical History:  Procedure Laterality Date   APPENDECTOMY     CARDIAC CATHETERIZATION Left 10/02/2015   Procedure: Left Heart Cath and Coronary Angiography;  Surgeon: Dionisio David, MD;  Location: [place] [facility];  Service: Cardiovascular;  Laterality: Left;   COLONOSCOPY WITH PROPOFOL N/A 12/08/2015   Procedure: COLONOSCOPY WITH PROPOFOL;  Surgeon: Lucilla Lame, MD;  Location: [facility];  Service: Endoscopy;  Laterality: N/A;   JOINT REPLACEMENT Right 2021   KNEE ARTHROPLASTY Right 08/21/2019   Procedure: COMPUTER ASSISTED TOTAL KNEE ARTHROPLASTY;  Surgeon: Dereck Leep, MD;  Location: [facility];  Service: Orthopedics;  Laterality: Right;   KNEE ARTHROSCOPY Right 09/02/2015   Procedure: ARTHROSCOPY KNEE, debridement, microfracture;  Surgeon: Leanor Kail, MD;  Location: [facility] OR[facility];  Service: Orthopedics;  Laterality: Right;   KNEE ARTHROSCOPY WITH MEDIAL MENISECTOMY Right 11/26/2014   Procedure: KNEE ARTHROSCOPY WITH MEDIAL MENISECTOMY;  Surgeon: Leanor Kail, MD;  Location: [facility];  Service:  Orthopedics;  Laterality: Right;   KNEE LIGAMENT RECONSTRUCTION Right 1959   pt did not have ligaments, ligaments were made and implanted.   KNEE SURGERY Left 1969   growth bone removed.   LEFT HEART CATH AND CORONARY ANGIOGRAPHY Left 12/10/2020   Procedure: LEFT HEART CATH AND CORONARY ANGIOGRAPHY;  Surgeon: Yolonda Kida, MD;  Location: [place] [facility];  Service: Cardiovascular;  Laterality: Left;   LUMBAR LAMINECTOMY/DECOMPRESSION MICRODISCECTOMY Right 06/29/2020   Procedure: RIGHT L3/4 FAR LATERAL DISCECTOMY;  Surgeon: Deetta Perla, MD;  Location: [facility];  Service: Neurosurgery;  Laterality: Right;  L3- L4   SHOULDER ARTHROSCOPY Bilateral    one in January and one in June    Prior to Admission medications   Medication Sig Start Date End Date Taking? Authorizing Provider  aspirin EC 81 MG tablet Take 81 mg by mouth daily. Swallow whole.    [provider]  baclofen (LIORESAL) 10 MG tablet Take 10 mg by mouth in the morning. 11/18/20   [provider]  diltiazem (CARDIZEM CD) 120 MG 24 hr capsule Take 120 mg by mouth in the morning. 09/09/20   [provider]  levothyroxine (SYNTHROID) 25 MCG tablet Take 25 mcg by mouth daily before breakfast. 30 minutes before eating 07/15/20   [provider]  magnesium oxide (MAG-OX) 400 MG tablet Take 400 mg by mouth in the morning. Patient not taking: Reported on 12/10/2020    [provider]  Multiple Vitamins-Minerals (MULTIVITAMIN WITH MINERALS) tablet Take 1 tablet by mouth in the morning.    [provider]  Na Sulfate-K Sulfate-Mg Sulf 17.5-3.13-1.6 GM/177ML SOLN At 5 PM the day before procedure take 1 bottle and 5 hours before procedure take 1 bottle. 09/15/20   Virgel Manifold, MD  oxyCODONE-acetaminophen (PERCOCET) 7.5-325 MG tablet Take 1 tablet by mouth 2 (two) times daily as needed (pain). 11/13/20   [provider]  pantoprazole (PROTONIX) 40 MG tablet Take 40 mg by mouth  in the morning. 07/15/20   [provider]  rosuvastatin (CRESTOR) 20 MG tablet Take 20 mg by mouth in the morning. 09/09/20   [provider]    Allergies as of 12/22/2020 - Review Complete 12/10/2020  Allergen Reaction Noted   Hydrocodone Itching 02/27/2015   Penicillins Nausea And Vomiting and Rash 07/26/2017    Family History  Problem Relation Age of Onset   COPD Mother    Heart disease Father     Social History   Socioeconomic History   Marital status: Married    Spouse name: Butch Penny    Number of children: Not on file   Years of education: Not on file   Highest education level: Not on file  Occupational History   Not on file  Tobacco Use   Smoking status: Every Day    Packs/day: 1.00    Years: 35.00    Pack years: 35.00    Types: Cigarettes   Smokeless tobacco: Never   Tobacco comments:    started 1987  Vaping Use   Vaping Use: Never used  Substance and Sexual Activity   Alcohol use: Yes    Alcohol/week: 35.0 - 42.0 standard drinks    Types: 35 - 42 Shots of liquor per week    Comment: (09/15/20 - pt reports 5-7 shots of liquor per day) .5 pint liquer per week. Pt reports he has stopped drinking like this in August of 2017   Drug use: No   Sexual activity: Not on file  Other Topics Concern   Not on file  Social History Narrative   Not on file   Social Determinants of Health   Financial Resource Strain: Not on file  Food Insecurity: Not on file  Transportation Needs: Not on file  Physical Activity: Not on file  Stress: Not on file  Social Connections: Not on file  Intimate Partner Violence: Not on file    Review of Systems: See HPI, otherwise negative ROS  Physical Exam: BP (!) 146/85   Pulse 79   Temp (!) 97 F (36.1 C) (Temporal)   Resp 18   Ht 5\' 6"  (1.676 m)   Wt 89.8 kg   SpO2 100%   BMI 31.96 kg/m  General:   Alert,  pleasant and cooperative in NAD Head:  Normocephalic and atraumatic. Neck:  Supple; no masses or  thyromegaly. Lungs:  Clear throughout to auscultation, normal respiratory effort.    Heart:  +S1, +S2, Regular rate and rhythm, No edema. Abdomen:  Soft, nontender and nondistended. Normal bowel sounds, without guarding, and without rebound.   Neurologic:  Alert and  oriented x4;  grossly normal neurologically.  Impression/Plan: Russell Rivas is here for a colonoscopy to be performed for screening and EGD for GERD, barretts esophagus, intractable hiccups  Risks, benefits, limitations, and alternatives regarding the procedures have been reviewed with the patient.  Questions have been answered.  All parties agreeable.   Virgel Manifold, MD  12/30/2020,  9:22 AM

## 2020-12-30 NOTE — Op Note (Addendum)
[facility] Patient Name: Russell Rivas Procedure Date: 12/30/2020 9:06 AM MRN: [id number] Account #: [id number] Date of Birth: 10-10-1955 Admit Type: Outpatient Age: 65 Room: [facility] ENDO ROOM 3 Gender: Male Note Status: Finalized Procedure:             Upper GI endoscopy Indications:           Heartburn, Esophageal reflux symptoms that persist                         despite appropriate therapy, Screening for Barrett's                         esophagus Providers:             Vickey Boak B. Bonna Gains MD, MD Medicines:             Monitored Anesthesia Care Complications:         No immediate complications. Procedure:             Pre-Anesthesia Assessment:                        - Prior to the procedure, a History and Physical was                         performed, and patient medications, allergies and                         sensitivities were reviewed. The patient's tolerance                         of previous anesthesia was reviewed.                        - The risks and benefits of the procedure and the                         sedation options and risks were discussed with the                         patient. All questions were answered and informed                         consent was obtained.                        - Patient identification and proposed procedure were                         verified prior to the procedure by the physician, the                         nurse, the anesthesiologist, the anesthetist and the                         technician. The procedure was verified in the                         procedure room.                        -  ASA Grade Assessment: II - A patient with mild                         systemic disease.                        After obtaining informed consent, the endoscope was                         passed under direct vision. Throughout the procedure,                         the patient's blood pressure,  pulse, and oxygen                         saturations were monitored continuously. The Endoscope                         was introduced through the mouth, and advanced to the                         second part of duodenum. The upper GI endoscopy was                         accomplished with ease. The patient tolerated the                         procedure well. Findings:      A widely patent Schatzki ring was found at the gastroesophageal junction.      The exam of the esophagus was otherwise normal.      A 2 cm hiatal hernia was present.      The exam of the stomach was otherwise normal.      The entire examined stomach was normal. Biopsies were obtained in the       gastric body, at the incisura and in the gastric antrum with cold       forceps for histology. Biopsies were taken with a cold forceps for       Helicobacter pylori testing.      One non-bleeding superficial duodenal ulcer with a clean ulcer base       (Forrest Class III) was found in the duodenal bulb. The lesion was 3 mm       in largest dimension. Biopsies were taken with a cold forceps for       histology.      Patchy mildly erythematous mucosa without active bleeding and with no       stigmata of bleeding was found in the duodenal bulb.      Mild mucosal changes characterized by discoloration were found in the       second portion of the duodenum. Biopsies were taken with a cold forceps       for histology. This was present around the 5 o clock position and one       bite/biopsy was taken. This was not present close to the ampulla and       thus the ampulla was not biopsied or intervened on.      The exam of the duodenum was otherwise normal. Impression:            - Widely patent Schatzki ring.                        -  2 cm hiatal hernia.                        - Normal stomach. Biopsied.                        - Non-bleeding duodenal ulcer with a clean ulcer base                         (Forrest Class III).  Biopsied.                        - Erythematous duodenopathy.                        - Mucosal changes in the duodenum. Biopsied.                        - Biopsies were obtained in the gastric body, at the                         incisura and in the gastric antrum. Recommendation:        - Await pathology results.                        - Follow an antireflux regimen.                        - Avoid NSAIDs except Aspirin if medically indicated                         by PCP                        - Take prescribed proton pump inhibitor or H2 blocker                         (antacid) medications 30 - 60 minutes before meals.                        - Discharge patient to home (with escort).                        - Advance diet as tolerated.                        - Continue present medications.                        - Patient has a contact number available for                         emergencies. The signs and symptoms of potential                         delayed complications were discussed with the patient.                         Return to normal activities tomorrow. Written  discharge instructions were provided to the patient.                        - Discharge patient to home (with escort).                        - The findings and recommendations were discussed with                         the patient.                        - The findings and recommendations were discussed with                         the patient's family. Procedure Code(s):     --- Professional ---                        [phone], Esophagogastroduodenoscopy, flexible,                         transoral; with biopsy, single or multiple Diagnosis Code(s):     --- Professional ---                        K22.2, Esophageal obstruction                        K44.9, Diaphragmatic hernia without obstruction or                         gangrene                        K26.9, Duodenal ulcer, unspecified  as acute or                         chronic, without hemorrhage or perforation                        K31.89, Other diseases of stomach and duodenum                        R12, Heartburn                        K21.9, Gastro-esophageal reflux disease without                         esophagitis                        Z13.810, Encounter for screening for upper                         gastrointestinal disorder CPT copyright 2019 American Medical Association. All rights reserved. The codes documented in this report are preliminary and upon coder review may  be revised to meet current compliance requirements.  Vonda Antigua, MD Margretta Sidle B. Bonna Gains MD, MD 12/30/2020 9:42:22 AM This report has been signed electronically. Number of Addenda: 0 Note Initiated On: 12/30/2020 9:06 AM Estimated Blood Loss:  Estimated blood loss: none.  [facility]

## 2020-12-30 NOTE — Anesthesia Postprocedure Evaluation (Signed)
Anesthesia Post Note  Patient: Russell Rivas  Procedure(s) Performed: COLONOSCOPY WITH PROPOFOL ESOPHAGOGASTRODUODENOSCOPY (EGD)  Patient location during evaluation: Endoscopy Anesthesia Type: General Level of consciousness: awake and alert and oriented Pain management: pain level controlled Vital Signs Assessment: post-procedure vital signs reviewed and stable Respiratory status: spontaneous breathing, nonlabored ventilation and respiratory function stable Cardiovascular status: blood pressure returned to baseline and stable Postop Assessment: no signs of nausea or vomiting Anesthetic complications: no   No notable events documented.   Last Vitals:  Vitals:   12/30/20 1040 12/30/20 1050  BP: (!) 152/91 (!) 152/89  Pulse: 74 71  Resp: 14 14  Temp:    SpO2: 100% 99%    Last Pain:  Vitals:   12/30/20 1020  TempSrc: Temporal  PainSc:                  Russell Rivas

## 2020-12-30 NOTE — Anesthesia Preprocedure Evaluation (Signed)
Anesthesia Evaluation  Patient identified by MRN, date of birth, ID band Patient awake    Reviewed: Allergy & Precautions, NPO status , Patient's Chart, lab work & pertinent test results  History of Anesthesia Complications Negative for: history of anesthetic complications  Airway Mallampati: II  TM Distance: >3 FB Neck ROM: Full    Dental  (+) Poor Dentition   Pulmonary COPD, Current Smoker and Patient abstained from smoking.,    breath sounds clear to auscultation- rhonchi (-) wheezing      Cardiovascular hypertension, Pt. on medications (-) CAD, (-) Past MI, (-) Cardiac Stents and (-) CABG  Rhythm:Regular Rate:Normal - Systolic murmurs and - Diastolic murmurs    Neuro/Psych PSYCHIATRIC DISORDERS Anxiety Depression TIA   GI/Hepatic Neg liver ROS, GERD  ,  Endo/Other  neg diabetesHypothyroidism   Renal/GU negative Renal ROS     Musculoskeletal  (+) Arthritis ,   Abdominal (+) + obese,   Peds  Hematology negative hematology ROS (+)   Anesthesia Other Findings Past Medical History: No date: Alcohol abuse     Comment:  pt reports drinking at least one pint everyday.  No date: Anxiety No date: Arthritis No date: COPD (chronic obstructive pulmonary disease) (HCC)     Comment:  NO inhalers 08/19/2020: COVID-19 No date: Depression No date: GERD (gastroesophageal reflux disease) No date: Hypertension No date: Hypothyroidism     Comment:  does not take medication at this time.  has in the past No date: IBS (irritable bowel syndrome) No date: Polio     Comment:  as a child, caused poblems in right knee. No date: TIA (transient ischemic attack)     Comment:  no deficits No date: Wears dentures     Comment:  partial upper   Reproductive/Obstetrics                             Anesthesia Physical Anesthesia Plan  ASA: 3  Anesthesia Plan: General   Post-op Pain Management:     Induction: Intravenous  PONV Risk Score and Plan: 0 and Propofol infusion  Airway Management Planned: Natural Airway  Additional Equipment:   Intra-op Plan:   Post-operative Plan:   Informed Consent: I have reviewed the patients History and Physical, chart, labs and discussed the procedure including the risks, benefits and alternatives for the proposed anesthesia with the patient or authorized representative who has indicated his/her understanding and acceptance.     Dental advisory given  Plan Discussed with: CRNA and Anesthesiologist  Anesthesia Plan Comments:         Anesthesia Quick Evaluation

## 2020-12-30 NOTE — Anesthesia Procedure Notes (Signed)
Date/Time: 12/30/2020 9:20 AM Performed by: Johnna Acosta, CRNA Pre-anesthesia Checklist: Patient identified, Emergency Drugs available, Suction available and Patient being monitored Patient Re-evaluated:Patient Re-evaluated prior to induction Oxygen Delivery Method: Nasal cannula Preoxygenation: Pre-oxygenation with 100% oxygen Induction Type: IV induction

## 2020-12-30 NOTE — Transfer of Care (Signed)
Immediate Anesthesia Transfer of Care Note  Patient: Russell Rivas  Procedure(s) Performed: COLONOSCOPY WITH PROPOFOL ESOPHAGOGASTRODUODENOSCOPY (EGD)  Patient Location: PACU  Anesthesia Type:General  Level of Consciousness: awake and drowsy  Airway & Oxygen Therapy: Patient Spontanous Breathing  Post-op Assessment: Report given to RN and Post -op Vital signs reviewed and stable  Post vital signs: Reviewed and stable  Last Vitals:  Vitals Value Taken Time  BP 129/86 12/30/20 1023  Temp 36.1 C 12/30/20 1020  Pulse 82 12/30/20 1026  Resp 22 12/30/20 1026  SpO2 100 % 12/30/20 1026  Vitals shown include unvalidated device data.  Last Pain:  Vitals:   12/30/20 1020  TempSrc: Temporal  PainSc:          Complications: No notable events documented.

## 2020-12-30 NOTE — Op Note (Signed)
[facility] Gastroenterology Patient Name: Russell Rivas Procedure Date: 12/30/2020 9:05 AM MRN: [id number] Account #: [id number] Date of Birth: [birth year] Admit Type: Outpatient Age: 65 Room: [facility] ENDO ROOM 3 Gender: Male Note Status: Finalized Procedure:             Colonoscopy Indications:           Screening for colorectal malignant neoplasm Providers:             Aloysius Heinle B. Bonna Gains MD, MD Medicines:             Monitored Anesthesia Care Complications:         No immediate complications. Procedure:             Pre-Anesthesia Assessment:                        - ASA Grade Assessment: II - A patient with mild                         systemic disease.                        - Prior to the procedure, a History and Physical was                         performed, and patient medications, allergies and                         sensitivities were reviewed. The patient's tolerance                         of previous anesthesia was reviewed.                        - The risks and benefits of the procedure and the                         sedation options and risks were discussed with the                         patient. All questions were answered and informed                         consent was obtained.                        - Patient identification and proposed procedure were                         verified prior to the procedure by the physician, the                         nurse, the anesthesiologist, the anesthetist and the                         technician. The procedure was verified in the                         procedure room.  After obtaining informed consent, the colonoscope was                         passed under direct vision. Throughout the procedure,                         the patient's blood pressure, pulse, and oxygen                         saturations were monitored continuously. The                         Colonoscope was  introduced through the anus and                         advanced to the the cecum, identified by appendiceal                         orifice and ileocecal valve. The colonoscopy was                         performed with ease. The patient tolerated the                         procedure well. The quality of the bowel preparation                         was fair. Prep was fair specifically in the right                         colon. It was cleaned with suction and water as much                         as possible. Findings:      The perianal and digital rectal examinations were normal.      Two flat polyps were found in the cecum. The polyps were 3 to 4 mm in       size. These polyps were removed with a cold biopsy forceps. Resection       and retrieval were complete.      A 8 mm polyp was found in the cecum. The polyp was flat. The polyp was       removed with a cold snare. Resection and retrieval were complete.      Multiple diverticula were found in the sigmoid colon.      The exam was otherwise without abnormality.      The rectum, sigmoid colon, descending colon, transverse colon, ascending       colon and cecum appeared normal.      Non-bleeding internal hemorrhoids were found during retroflexion.      No additional abnormalities were found on retroflexion. Impression:            - Preparation of the colon was fair.                        - Two 3 to 4 mm polyps in the cecum, removed with a  cold biopsy forceps. Resected and retrieved.                        - One 8 mm polyp in the cecum, removed with a cold                         snare. Resected and retrieved.                        - Diverticulosis in the sigmoid colon.                        - The examination was otherwise normal.                        - The rectum, sigmoid colon, descending colon,                         transverse colon, ascending colon and cecum are normal.                        -  Non-bleeding internal hemorrhoids. Recommendation:        - Discharge patient to home (with escort).                        - High fiber diet.                        - Advance diet as tolerated.                        - Continue present medications.                        - Await pathology results.                        - Repeat colonoscopy in 3 years, with 2 day prep.                         because the bowel preparation was suboptimal.                        - The findings and recommendations were discussed with                         the patient.                        - The findings and recommendations were discussed with                         the patient's family.                        - Return to primary care physician as previously                         scheduled. Procedure Code(s):     --- Professional ---  45385, Colonoscopy, flexible; with removal of                         tumor(s), polyp(s), or other lesion(s) by snare                         technique                        45380, 51, Colonoscopy, flexible; with biopsy, single                         or multiple Diagnosis Code(s):     --- Professional ---                        K63.5, Polyp of colon                        Z12.11, Encounter for screening for malignant neoplasm                         of colon CPT copyright 2019 [employer]. All rights reserved. The codes documented in this report are preliminary and upon coder review may  be revised to meet current compliance requirements.  Vonda Antigua, MD Margretta Sidle B. Bonna Gains MD, MD 12/30/2020 10:30:26 AM This report has been signed electronically. Number of Addenda: 0 Note Initiated On: 12/30/2020 9:05 AM Scope Withdrawal Time: 0 hours 23 minutes 17 seconds  Total Procedure Duration: 0 hours 35 minutes 26 seconds  Estimated Blood Loss:  Estimated blood loss: none.      [facility]

## 2020-12-31 ENCOUNTER — Encounter: Payer: Self-pay | Admitting: Gastroenterology

## 2021-01-01 ENCOUNTER — Encounter: Payer: Self-pay | Admitting: Gastroenterology

## 2021-01-01 LAB — SURGICAL PATHOLOGY

## 2021-01-04 ENCOUNTER — Telehealth: Payer: Self-pay | Admitting: Gastroenterology

## 2021-01-04 NOTE — Telephone Encounter (Signed)
Patient asks for call to discuss results.

## 2021-01-06 NOTE — Telephone Encounter (Signed)
I spoke to pt and he is aware of results as well as that he should be receiving letter in mail with information

## 2021-03-09 ENCOUNTER — Ambulatory Visit: Payer: Medicare Other | Admitting: Gastroenterology

## 2021-03-09 ENCOUNTER — Encounter: Payer: Self-pay | Admitting: *Deleted

## 2021-04-16 ENCOUNTER — Other Ambulatory Visit: Payer: Self-pay | Admitting: Physical Medicine & Rehabilitation

## 2021-04-16 DIAGNOSIS — M48062 Spinal stenosis, lumbar region with neurogenic claudication: Secondary | ICD-10-CM

## 2021-04-16 DIAGNOSIS — G8929 Other chronic pain: Secondary | ICD-10-CM

## 2021-04-16 DIAGNOSIS — M5441 Lumbago with sciatica, right side: Secondary | ICD-10-CM

## 2021-04-27 ENCOUNTER — Other Ambulatory Visit: Payer: Self-pay

## 2021-04-27 ENCOUNTER — Ambulatory Visit
Admission: RE | Admit: 2021-04-27 | Discharge: 2021-04-27 | Disposition: A | Discharge status: Home / Self Care | Payer: Medicare Other | Source: Ambulatory Visit | Attending: Physical Medicine & Rehabilitation | Admitting: Physical Medicine & Rehabilitation

## 2021-04-27 DIAGNOSIS — G8929 Other chronic pain: Secondary | ICD-10-CM | POA: Diagnosis present

## 2021-04-27 DIAGNOSIS — M5441 Lumbago with sciatica, right side: Secondary | ICD-10-CM | POA: Diagnosis present

## 2021-04-27 DIAGNOSIS — M48062 Spinal stenosis, lumbar region with neurogenic claudication: Secondary | ICD-10-CM | POA: Insufficient documentation

## 2021-04-27 MED ORDER — GADOBUTROL 1 MMOL/ML IV SOLN
9.0000 mL | Freq: Once | INTRAVENOUS | Status: AC | PRN
  Administered 2021-04-27: 9 mL via INTRAVENOUS

## 2021-12-03 ENCOUNTER — Emergency Department
Admission: EM | Admit: 2021-12-03 | Discharge: 2021-12-03 | Disposition: A | Discharge status: Home / Self Care | Payer: Medicare Other | Attending: Emergency Medicine | Admitting: Emergency Medicine

## 2021-12-03 ENCOUNTER — Other Ambulatory Visit: Payer: Self-pay

## 2021-12-03 DIAGNOSIS — J449 Chronic obstructive pulmonary disease, unspecified: Secondary | ICD-10-CM | POA: Diagnosis not present

## 2021-12-03 DIAGNOSIS — I10 Essential (primary) hypertension: Secondary | ICD-10-CM | POA: Diagnosis not present

## 2021-12-03 DIAGNOSIS — Z7982 Long term (current) use of aspirin: Secondary | ICD-10-CM | POA: Diagnosis not present

## 2021-12-03 DIAGNOSIS — Z96651 Presence of right artificial knee joint: Secondary | ICD-10-CM | POA: Insufficient documentation

## 2021-12-03 DIAGNOSIS — Z79899 Other long term (current) drug therapy: Secondary | ICD-10-CM | POA: Diagnosis not present

## 2021-12-03 DIAGNOSIS — M545 Low back pain, unspecified: Secondary | ICD-10-CM | POA: Diagnosis present

## 2021-12-03 DIAGNOSIS — M5442 Lumbago with sciatica, left side: Secondary | ICD-10-CM | POA: Insufficient documentation

## 2021-12-03 DIAGNOSIS — Z8616 Personal history of COVID-19: Secondary | ICD-10-CM | POA: Insufficient documentation

## 2021-12-03 DIAGNOSIS — I2510 Atherosclerotic heart disease of native coronary artery without angina pectoris: Secondary | ICD-10-CM | POA: Insufficient documentation

## 2021-12-03 DIAGNOSIS — E039 Hypothyroidism, unspecified: Secondary | ICD-10-CM | POA: Insufficient documentation

## 2021-12-03 LAB — URINALYSIS, ROUTINE W REFLEX MICROSCOPIC
Bilirubin Urine: NEGATIVE
Glucose, UA: NEGATIVE mg/dL
Hgb urine dipstick: NEGATIVE
Ketones, ur: NEGATIVE mg/dL
Leukocytes,Ua: NEGATIVE
Nitrite: NEGATIVE
Protein, ur: NEGATIVE mg/dL
Specific Gravity, Urine: 1.004 — ABNORMAL LOW (ref 1.005–1.030)
pH: 7 (ref 5.0–8.0)

## 2021-12-03 LAB — CBC
HCT: 40.1 % (ref 39.0–52.0)
Hemoglobin: 13.4 g/dL (ref 13.0–17.0)
MCH: 32.1 pg (ref 26.0–34.0)
MCHC: 33.4 g/dL (ref 30.0–36.0)
MCV: 96.2 fL (ref 80.0–100.0)
Platelets: 171 10*3/uL (ref 150–400)
RBC: 4.17 MIL/uL — ABNORMAL LOW (ref 4.22–5.81)
RDW: 14.8 % (ref 11.5–15.5)
WBC: 5.4 10*3/uL (ref 4.0–10.5)
nRBC: 0 % (ref 0.0–0.2)

## 2021-12-03 LAB — BASIC METABOLIC PANEL
Anion gap: 10 (ref 5–15)
BUN: 7 mg/dL — ABNORMAL LOW (ref 8–23)
CO2: 26 mmol/L (ref 22–32)
Calcium: 8.5 mg/dL — ABNORMAL LOW (ref 8.9–10.3)
Chloride: 106 mmol/L (ref 98–111)
Creatinine, Ser: 0.67 mg/dL (ref 0.61–1.24)
GFR, Estimated: >60 mL/min (ref >60)
Glucose, Bld: 91 mg/dL (ref 70–99)
Potassium: 3.6 mmol/L (ref 3.5–5.1)
Sodium: 142 mmol/L (ref 135–145)

## 2021-12-03 LAB — CK: Total CK: 92 U/L (ref 49–397)

## 2021-12-03 MED ORDER — DIAZEPAM 5 MG PO TABS: 5.0000 mg | ORAL_TABLET | Freq: Three times a day (TID) | ORAL | 0 refills | Status: DC | PRN

## 2021-12-03 MED ORDER — IBUPROFEN 800 MG PO TABS: 800.0000 mg | ORAL_TABLET | Freq: Three times a day (TID) | ORAL | 0 refills | Status: DC | PRN

## 2021-12-03 MED ORDER — PREDNISONE 20 MG PO TABS
30.0000 mg | ORAL_TABLET | Freq: Once | ORAL | Status: AC
  Administered 2021-12-03: 30 mg via ORAL
  Filled 2021-12-03: qty 1

## 2021-12-03 MED ORDER — OXYCODONE-ACETAMINOPHEN 5-325 MG PO TABS
1.0000 | ORAL_TABLET | Freq: Once | ORAL | Status: AC
  Administered 2021-12-03: 1 via ORAL
  Filled 2021-12-03: qty 1

## 2021-12-03 MED ORDER — KETOROLAC TROMETHAMINE 30 MG/ML IJ SOLN
15.0000 mg | Freq: Once | INTRAMUSCULAR | Status: AC
  Administered 2021-12-03: 15 mg via INTRAVENOUS
  Filled 2021-12-03: qty 1

## 2021-12-03 MED ORDER — DIAZEPAM 5 MG PO TABS
5.0000 mg | ORAL_TABLET | Freq: Once | ORAL | Status: AC
  Administered 2021-12-03: 5 mg via ORAL
  Filled 2021-12-03: qty 1

## 2021-12-03 MED ORDER — SODIUM CHLORIDE 0.9 % IV BOLUS
1000.0000 mL | Freq: Once | INTRAVENOUS | Status: AC
  Administered 2021-12-03: 1000 mL via INTRAVENOUS

## 2021-12-03 MED ORDER — OXYCODONE-ACETAMINOPHEN 7.5-325 MG PO TABS: 1.0000 | ORAL_TABLET | ORAL | 0 refills | Status: DC | PRN

## 2021-12-03 MED ORDER — METHYLPREDNISOLONE 4 MG PO TBPK: ORAL_TABLET | ORAL | 0 refills | Status: DC

## 2021-12-03 NOTE — Discharge Instructions (Addendum)
Take Medrol Dosepak as prescribed.  Continue Naprosyn as prescribed by your doctor.  You may add Percocet for more severe pain and Valium for muscle spasms.  Return to the ER for worsening symptoms, persistent vomiting, difficulty breathing or other concerns.

## 2021-12-03 NOTE — ED Provider Notes (Signed)
[facility] Provider Note    Event Date/Time   First MD Initiated Contact with Patient 12/03/21 0034     (approximate)   History   Back Pain   HPI  Russell Rivas is a 66 y.o. male who presents to the ED from home with a chief complaint of nontraumatic left-sided lower back pain which began yesterday afternoon.  Works out 5 days a week and worked his back 2 days ago.  Endorses left lower back pain radiating into his groin and leg.  Denies extremity weakness/numbness/tingling.  Denies bowel or bladder incontinence.  Denies fall/injury/trauma.  Denies fever, cough, chest pain, shortness of breath, abdominal pain, nausea, vomiting or dizziness.     Past Medical History   Past Medical History:  Diagnosis Date   Alcohol abuse    pt reports drinking at least one pint everyday.    Anxiety    Arthritis    COPD (chronic obstructive pulmonary disease) (HCC)    NO inhalers   COVID-19 08/19/2020   Depression    GERD (gastroesophageal reflux disease)    Hypertension    Hypothyroidism    does not take medication at this time.  has in the past   IBS (irritable bowel syndrome)    Polio    as a child, caused poblems in right knee.   TIA (transient ischemic attack)    no deficits   Wears dentures    partial upper     Active Problem List   Patient Active Problem List   Diagnosis Date Noted   Encounter for screening for upper gastrointestinal disorder    Gastroesophageal reflux disease    Intractable hiccups    Hiatal hernia    Duodenal ulcer    Special screening for malignant neoplasms, colon    Cecal polyp    Hypertension, essential 07/15/2020   Acute bilateral low back pain with bilateral sciatica 05/12/2020   Right leg weakness 05/12/2020   Irritable bowel syndrome 02/26/2020   Total knee replacement status 08/21/2019   Status post left rotator cuff repair 11/30/2018   Depression, prolonged 08/28/2018   Hypothyroidism 08/28/2018   TIA  (transient ischemic attack) 08/20/2018   COPD mixed type ([place]) 04/26/2017   Coronary artery disease, non-occlusive 04/26/2017   Tobacco abuse 04/26/2017   Primary osteoarthritis of right knee 05/30/2016   Blood in stool    Rectal polyp    First degree hemorrhoids    Increased MCV 06/17/2015   Current tear of meniscus 12/05/2014   Arthropathy, traumatic, knee 12/05/2014   Lumbar radiculopathy 04/15/2013   Complete rotator cuff rupture of left shoulder 09/04/2012     Past Surgical History   Past Surgical History:  Procedure Laterality Date   APPENDECTOMY     CARDIAC CATHETERIZATION Left 10/02/2015   Procedure: Left Heart Cath and Coronary Angiography;  Surgeon: Dionisio David, MD;  Location: [place] [facility];  Service: Cardiovascular;  Laterality: Left;   COLONOSCOPY WITH PROPOFOL N/A 12/08/2015   Procedure: COLONOSCOPY WITH PROPOFOL;  Surgeon: Lucilla Lame, MD;  Location: [facility] ENDOSCOPY;  Service: Endoscopy;  Laterality: N/A;   COLONOSCOPY WITH PROPOFOL N/A 12/30/2020   Procedure: COLONOSCOPY WITH PROPOFOL;  Surgeon: Virgel Manifold, MD;  Location: [facility] ENDOSCOPY;  Service: Endoscopy;  Laterality: N/A;   ESOPHAGOGASTRODUODENOSCOPY N/A 12/30/2020   Procedure: ESOPHAGOGASTRODUODENOSCOPY (EGD);  Surgeon: Virgel Manifold, MD;  Location: [facility] ENDOSCOPY;  Service: Endoscopy;  Laterality: N/A;   JOINT REPLACEMENT Right 2021   KNEE ARTHROPLASTY Right  08/21/2019   Procedure: COMPUTER ASSISTED TOTAL KNEE ARTHROPLASTY;  Surgeon: Dereck Leep, MD;  Location: [facility] ORS;  Service: Orthopedics;  Laterality: Right;   KNEE ARTHROSCOPY Right 09/02/2015   Procedure: ARTHROSCOPY KNEE, debridement, microfracture;  Surgeon: Leanor Kail, MD;  Location: [facility] ORS;  Service: Orthopedics;  Laterality: Right;   KNEE ARTHROSCOPY WITH MEDIAL MENISECTOMY Right 11/26/2014   Procedure: KNEE ARTHROSCOPY WITH MEDIAL MENISECTOMY;  Surgeon: Leanor Kail, MD;  Location: [facility] ORS;  Service: Orthopedics;   Laterality: Right;   KNEE LIGAMENT RECONSTRUCTION Right 1959   pt did not have ligaments, ligaments were made and implanted.   KNEE SURGERY Left 1969   growth bone removed.   LEFT HEART CATH AND CORONARY ANGIOGRAPHY Left 12/10/2020   Procedure: LEFT HEART CATH AND CORONARY ANGIOGRAPHY;  Surgeon: Yolonda Kida, MD;  Location: [place] [facility];  Service: Cardiovascular;  Laterality: Left;   LUMBAR LAMINECTOMY/DECOMPRESSION MICRODISCECTOMY Right 06/29/2020   Procedure: RIGHT L3/4 FAR LATERAL DISCECTOMY;  Surgeon: Deetta Perla, MD;  Location: [facility] ORS;  Service: Neurosurgery;  Laterality: Right;  L3- L4   SHOULDER ARTHROSCOPY Bilateral    one in January and one in June     Home Medications   Prior to Admission medications   Medication Sig Start Date End Date Taking? Authorizing Provider  aspirin EC 81 MG tablet Take 81 mg by mouth daily. Swallow whole.    [provider]  baclofen (LIORESAL) 10 MG tablet Take 10 mg by mouth in the morning. 11/18/20   [provider]  diltiazem (CARDIZEM CD) 120 MG 24 hr capsule Take 120 mg by mouth in the morning. 09/09/20   [provider]  levothyroxine (SYNTHROID) 25 MCG tablet Take 25 mcg by mouth daily before breakfast. 30 minutes before eating 07/15/20   [provider]  magnesium oxide (MAG-OX) 400 MG tablet Take 400 mg by mouth in the morning. Patient not taking: Reported on 12/10/2020    [provider]  Multiple Vitamins-Minerals (MULTIVITAMIN WITH MINERALS) tablet Take 1 tablet by mouth in the morning.    [provider]  Na Sulfate-K Sulfate-Mg Sulf 17.5-3.13-1.6 GM/177ML SOLN At 5 PM the day before procedure take 1 bottle and 5 hours before procedure take 1 bottle. 09/15/20   Virgel Manifold, MD  oxyCODONE-acetaminophen (PERCOCET) 7.5-325 MG tablet Take 1 tablet by mouth 2 (two) times daily as needed (pain). 11/13/20   [provider]  pantoprazole (PROTONIX) 40 MG tablet Take 1  tablet (40 mg total) by mouth 2 (two) times daily for 28 days. 12/30/20 01/27/21  Virgel Manifold, MD  rosuvastatin (CRESTOR) 20 MG tablet Take 20 mg by mouth in the morning. 09/09/20   [provider]     Allergies  Hydrocodone and Penicillins   Family History   Family History  Problem Relation Age of Onset   COPD Mother    Heart disease Father      Physical Exam  Triage Vital Signs: ED Triage Vitals  Enc Vitals Group     BP 12/03/21 0025 (!) 151/92     Pulse Rate 12/03/21 0025 84     Resp 12/03/21 0025 15     Temp 12/03/21 0025 97.8 F (36.6 C)     Temp Source 12/03/21 0025 Oral     SpO2 12/03/21 0025 95 %     Weight 12/03/21 0027 185 lb (83.9 kg)     Height 12/03/21 0027 '5\' 6"'$  (1.676 m)     Head Circumference --  Peak Flow --      Pain Score 12/03/21 0027 10     Pain Loc --      Pain Edu? --      Excl. in [place]? --     Updated Vital Signs: BP 123/74 (BP Location: Right Arm)   Pulse 74   Temp 97.8 F (36.6 C) (Oral)   Resp 16   Ht '5\' 6"'$  (1.676 m)   Wt 83.9 kg   SpO2 97%   BMI 29.86 kg/m    General: Awake, mild distress.  CV:  RRR.  Good peripheral perfusion.  Resp:  Normal effort.  CTA B. Abd:  Nontender.  No bruits.  No distention.  Other:  No spinal tenderness to palpation.  Left paraspinal lumbar muscle spasms and tenderness to palpation.  Negative straight leg raise.  5/5 motor strength and sensation.  No vesicles.   ED Results / Procedures / Treatments  Labs (all labs ordered are listed, but only abnormal results are displayed) Labs Reviewed  CBC - Abnormal; Notable for the following components:      Result Value   RBC 4.17 (*)    All other components within normal limits  BASIC METABOLIC PANEL - Abnormal; Notable for the following components:   BUN 7 (*)    Calcium 8.5 (*)    All other components within normal limits  URINALYSIS, ROUTINE W REFLEX MICROSCOPIC - Abnormal; Notable for the following components:   Color, Urine  YELLOW (*)    APPearance HAZY (*)    Specific Gravity, Urine 1.004 (*)    All other components within normal limits  CK     EKG  None   RADIOLOGY None   Official radiology report(s): No results found.   PROCEDURES:  Critical Care performed: No  Procedures   MEDICATIONS ORDERED IN ED: Medications  sodium chloride 0.9 % bolus 1,000 mL (0 mLs Intravenous Stopped 12/03/21 0239)  ketorolac (TORADOL) 30 MG/ML injection 15 mg (15 mg Intravenous Given 12/03/21 0107)  predniSONE (DELTASONE) tablet 30 mg (30 mg Oral Given 12/03/21 0107)  oxyCODONE-acetaminophen (PERCOCET/ROXICET) 5-325 MG per tablet 1 tablet (1 tablet Oral Given 12/03/21 0107)  diazepam (VALIUM) tablet 5 mg (5 mg Oral Given 12/03/21 0107)  sodium chloride 0.9 % bolus 1,000 mL (1,000 mLs Intravenous New Bag/Given 12/03/21 0247)     IMPRESSION / MDM / ASSESSMENT AND PLAN / ED COURSE  I reviewed the triage vital signs and the nursing notes.                             66 year old male presenting with left lower back pain.  Differential diagnosis includes but is not limited to musculoskeletal, renal colic, rhabdomyolysis, shingles, etc. I have personally reviewed patient's records and see that he last had an office visit with [facility] on 11/04/2021 for lumbar stenosis with neurogenic claudication.  We will check electrolytes, CK.  Initiate IV fluid hydration, IV ketorolac.  Administer oral prednisone, Percocet and Valium for muscle relaxation.  Will reassess.  Clinical Course as of 12/03/21 0354  Fri Dec 03, 2021  0229 Patient soundly asleep.  Awaken him to check pain level; patient is pain-free.  Awaiting urine specimen.  Will hang second liter IV fluids.  Updated patient and spouse on negative lab work WBC 5.4, normal electrolytes, normal CK. [JS]  0346 UA negative.  Patient feeling significantly better.  Will discharge home on Medrol Dosepak, analgesics, muscle relaxers and patient  will follow-up closely with his PCP.   Strict return precautions given.  Patient and spouse verbalized understanding and agree with plan of care. [JS]    Clinical Course User Index [JS] Paulette Blanch, MD     FINAL CLINICAL IMPRESSION(S) / ED DIAGNOSES   Final diagnoses:  Acute left-sided low back pain with left-sided sciatica     Rx / DC Orders   ED Discharge Orders     None        Note:  This document was prepared using Dragon voice recognition software and may include unintentional dictation errors.   Paulette Blanch, MD 12/03/21 [phone]

## 2021-12-03 NOTE — ED Triage Notes (Signed)
Pt presents to ER from home c/o left lower back pain that started yesterday afternoon.  Pt states he works out regularly and states he worked out his back 2 days ago.  Pt denies any urinary symptoms.  Pt endorses radiation to groin and down left leg.  Pt is A&O x4 at this time in NAD.

## 2022-04-07 ENCOUNTER — Ambulatory Visit: Payer: Medicare Other | Admitting: Orthopedic Surgery
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
## 2022-09-12 ENCOUNTER — Ambulatory Visit
Admission: RE | Admit: 2022-09-12 | Discharge: 2022-09-12 | Disposition: A | Discharge status: Home / Self Care | Payer: Self-pay | Source: Ambulatory Visit | Attending: Orthopedic Surgery | Admitting: Orthopedic Surgery

## 2022-09-12 ENCOUNTER — Other Ambulatory Visit: Payer: Self-pay

## 2022-09-12 DIAGNOSIS — Z049 Encounter for examination and observation for unspecified reason: Secondary | ICD-10-CM

## 2022-09-14 ENCOUNTER — Ambulatory Visit (INDEPENDENT_AMBULATORY_CARE_PROVIDER_SITE_OTHER): Payer: 59 | Admitting: Orthopedic Surgery

## 2022-09-14 ENCOUNTER — Encounter: Payer: Self-pay | Admitting: Orthopedic Surgery

## 2022-09-14 VITALS — BP 130/70 | Ht 66.0 in | Wt 187.2 lb

## 2022-09-14 DIAGNOSIS — M25562 Pain in left knee: Secondary | ICD-10-CM

## 2022-09-14 DIAGNOSIS — M4316 Spondylolisthesis, lumbar region: Secondary | ICD-10-CM | POA: Diagnosis not present

## 2022-09-14 DIAGNOSIS — M47816 Spondylosis without myelopathy or radiculopathy, lumbar region: Secondary | ICD-10-CM

## 2022-09-14 DIAGNOSIS — M4726 Other spondylosis with radiculopathy, lumbar region: Secondary | ICD-10-CM

## 2022-09-14 DIAGNOSIS — M5416 Radiculopathy, lumbar region: Secondary | ICD-10-CM

## 2022-09-14 NOTE — Patient Instructions (Signed)
It was so nice to see you today. Thank you so much for coming in.    Your lumbar xrays showed wear and tear (arthritis) and I think this may be causing your pain.   I want to get an MRI of your lower back to look into things further. We will get this approved through your insurance and [facility] will call you to schedule the appointment.   I want you to see ortho at the [facility] for your left knee. They should call you to schedule an appointment or you can call them at [phone].   Once I have the MRI results back, we will call you to schedule a follow up to review them.    Please do not hesitate to call if you have any questions or concerns. You can also message me in [place].   If you have not heard back about any of the above things in the next week, please call the office so we can help you get them scheduled.   Geronimo Boot PA-C [phone]

## 2022-09-17 ENCOUNTER — Ambulatory Visit
Admission: RE | Admit: 2022-09-17 | Discharge: 2022-09-17 | Disposition: A | Discharge status: Home / Self Care | Payer: 59 | Source: Ambulatory Visit | Attending: Orthopedic Surgery | Admitting: Orthopedic Surgery

## 2022-09-17 DIAGNOSIS — M5416 Radiculopathy, lumbar region: Secondary | ICD-10-CM | POA: Diagnosis present

## 2022-09-17 DIAGNOSIS — M47816 Spondylosis without myelopathy or radiculopathy, lumbar region: Secondary | ICD-10-CM | POA: Insufficient documentation

## 2022-09-17 DIAGNOSIS — M4316 Spondylolisthesis, lumbar region: Secondary | ICD-10-CM | POA: Diagnosis present

## 2022-09-23 ENCOUNTER — Other Ambulatory Visit: Payer: Self-pay | Admitting: Student

## 2022-09-23 DIAGNOSIS — M1712 Unilateral primary osteoarthritis, left knee: Secondary | ICD-10-CM

## 2022-09-23 DIAGNOSIS — M25562 Pain in left knee: Secondary | ICD-10-CM

## 2022-09-23 DIAGNOSIS — M25462 Effusion, left knee: Secondary | ICD-10-CM
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
## 2022-09-26 ENCOUNTER — Ambulatory Visit (INDEPENDENT_AMBULATORY_CARE_PROVIDER_SITE_OTHER): Payer: 59 | Admitting: Orthopedic Surgery

## 2022-09-26 ENCOUNTER — Encounter: Payer: Self-pay | Admitting: Orthopedic Surgery

## 2022-09-26 VITALS — BP 130/78 | HR 93 | Ht 66.0 in | Wt 187.0 lb

## 2022-09-26 DIAGNOSIS — M4316 Spondylolisthesis, lumbar region: Secondary | ICD-10-CM

## 2022-09-26 DIAGNOSIS — M48061 Spinal stenosis, lumbar region without neurogenic claudication: Secondary | ICD-10-CM | POA: Diagnosis not present

## 2022-09-26 DIAGNOSIS — M47816 Spondylosis without myelopathy or radiculopathy, lumbar region: Secondary | ICD-10-CM | POA: Diagnosis not present

## 2022-09-26 NOTE — Patient Instructions (Signed)
It was so nice to see you today. Thank you so much for coming in.    Your MRI showed wear and tear in your back and I think this may be causing your pain. I agree that your left knee pain is from your knee.   I want you to see physical medicine and rehab at the [facility] to discuss possible injections in your lower back. They should call you to schedule an appointment or you can call them at [phone]. I put in the referral that you wanted to see Dr. Alba Destine in [place].   I will see you back in 6-8 weeks. Please do not hesitate to call if you have any questions or concerns. You can also message me in [place].   Geronimo Boot PA-C [phone]

## 2022-09-27 ENCOUNTER — Ambulatory Visit
Admission: RE | Admit: 2022-09-27 | Discharge: 2022-09-27 | Disposition: A | Discharge status: Home / Self Care | Payer: 59 | Source: Ambulatory Visit | Attending: Student | Admitting: Student

## 2022-09-27 DIAGNOSIS — M1712 Unilateral primary osteoarthritis, left knee: Secondary | ICD-10-CM | POA: Diagnosis present

## 2022-09-27 DIAGNOSIS — M25562 Pain in left knee: Secondary | ICD-10-CM

## 2022-09-27 DIAGNOSIS — M25462 Effusion, left knee: Secondary | ICD-10-CM

## 2022-11-14 ENCOUNTER — Ambulatory Visit: Payer: 59 | Admitting: Orthopedic Surgery

## 2022-11-17 ENCOUNTER — Other Ambulatory Visit: Payer: Self-pay | Admitting: Surgery

## 2022-11-22 ENCOUNTER — Other Ambulatory Visit: Payer: Self-pay

## 2022-11-22 ENCOUNTER — Encounter
Admission: RE | Admit: 2022-11-22 | Discharge: 2022-11-22 | Disposition: A | Discharge status: Home / Self Care | Payer: 59 | Source: Ambulatory Visit | Attending: Surgery | Admitting: Surgery

## 2022-11-22 VITALS — Ht 66.5 in | Wt 185.0 lb

## 2022-11-22 DIAGNOSIS — Z720 Tobacco use: Secondary | ICD-10-CM

## 2022-11-22 DIAGNOSIS — G459 Transient cerebral ischemic attack, unspecified: Secondary | ICD-10-CM

## 2022-11-22 DIAGNOSIS — I2510 Atherosclerotic heart disease of native coronary artery without angina pectoris: Secondary | ICD-10-CM

## 2022-11-22 DIAGNOSIS — I10 Essential (primary) hypertension: Secondary | ICD-10-CM

## 2022-11-22 NOTE — Patient Instructions (Signed)
Your procedure is scheduled on: Wednesday 11/23/22 To find out your arrival time, please call [phone] between 1PM - 3PM on:   Tuesday 11/22/22 Report to the Registration Desk on the 1st floor of the [facility]. Valet parking is available.  If your arrival time is 6:00 am, do not arrive before that time as the [facility] entrance doors do not open until 6:00 am.  REMEMBER: Instructions that are not followed completely may result in serious medical risk, up to and including death; or upon the discretion of your surgeon and anesthesiologist your surgery may need to be rescheduled.  Do not eat food after midnight the night before surgery.  No gum chewing or hard candies.  You may however, drink CLEAR liquids up to 2 hours before you are scheduled to arrive for your surgery. Do not drink anything within 2 hours of your scheduled arrival time.  Clear liquids include: - water  - apple juice without pulp - gatorade (not RED colors) - black coffee or tea (Do NOT add milk or creamers to the coffee or tea) Do NOT drink anything that is not on this list.  Type 1 and Type 2 diabetics should only drink water.  One week prior to surgery: Stop Anti-inflammatories (NSAIDS) such as Advil, Aleve, Ibuprofen, Motrin, Naproxen, Naprosyn and Aspirin based products such as Excedrin, Goody's Powder, BC Powder. You may however, continue to take Tylenol if needed for pain up until the day of surgery.  Stop ANY OVER THE COUNTER supplements or vitamins until after surgery.  TAKE ONLY THESE MEDICATIONS THE MORNING OF SURGERY WITH A SIP OF WATER:  pantoprazole (PROTONIX) 40 MG tablet Antacid (take one the night before and one on the morning of surgery - helps to prevent nausea after surgery.) oxyCODONE-acetaminophen (PERCOCET/ROXICET) 5-325 MG tablet if needed baclofen (LIORESAL) 10 MG tablet if needed  No Alcohol for 24 hours before or after surgery.  No Smoking including e-cigarettes for 24 hours  before surgery.  No chewable tobacco products for at least 6 hours before surgery.  No nicotine patches on the day of surgery.  Do not use any "recreational" drugs for at least a week (preferably 2 weeks) before your surgery.  Please be advised that the combination of cocaine and anesthesia may have negative outcomes, up to and including death. If you test positive for cocaine, your surgery will be cancelled.  On the morning of surgery brush your teeth with toothpaste and water, you may rinse your mouth with mouthwash if you wish. Do not swallow any toothpaste or mouthwash.  Use CHG Soap or wipes as directed on instruction sheet. Shower with your regular soap in the morning.  Do not wear lotions, powders, or perfumes.   Do not shave body hair from the neck down 48 hours before surgery.  Wear comfortable clothing (specific to your surgery type) to the hospital.  Do not wear jewelry, make-up, hairpins, clips or nail polish.  Contact lenses, hearing aids and dentures may not be worn into surgery.  Do not bring valuables to the hospital. [facility] is not responsible for any missing/lost belongings or valuables.   Notify your doctor if there is any change in your medical condition (cold, fever, infection).  If you are being discharged the day of surgery, you will not be allowed to drive home. You will need a responsible individual to drive you home and stay with you for 24 hours after surgery.   If you are taking public transportation, you  will need to have a responsible individual with you.  If you are being admitted to the hospital overnight, leave your suitcase in the car. After surgery it may be brought to your room.  In case of increased patient census, it may be necessary for you, the patient, to continue your postoperative care in the Same Day Surgery department.  After surgery, you can help prevent lung complications by doing breathing exercises.  Take deep breaths and cough  every 1-2 hours. Your doctor may order a device called an Incentive Spirometer to help you take deep breaths. When coughing or sneezing, hold a pillow firmly against your incision with both hands. This is called "splinting." Doing this helps protect your incision. It also decreases belly discomfort.  Surgery Visitation Policy:  Patients undergoing a surgery or procedure may have two family members or support persons with them as long as the person is not COVID-19 positive or experiencing its symptoms.   Inpatient Visitation:    Visiting hours are 7 a.m. to 8 p.m. Up to four visitors are allowed at one time in a patient room. The visitors may rotate out with other people during the day. One designated support person (adult) may remain overnight.  Please call the Pre-admissions Testing Dept. at [phone] if you have any questions about these instructions.  How to Use an Incentive Spirometer  An incentive spirometer is a tool that measures how well you are filling your lungs with each breath. Learning to take long, deep breaths using this tool can help you keep your lungs clear and active. This may help to reverse or lessen your chance of developing breathing (pulmonary) problems, especially infection. You may be asked to use a spirometer: After a surgery. If you have a lung problem or a history of smoking. After a long period of time when you have been unable to move or be active. If the spirometer includes an indicator to show the highest number that you have reached, your health care provider or respiratory therapist will help you set a goal. Keep a log of your progress as told by your health care provider. What are the risks? Breathing too quickly may cause dizziness or cause you to pass out. Take your time so you do not get dizzy or light-headed. If you are in pain, you may need to take pain medicine before doing incentive spirometry. It is harder to take a deep breath if you are having  pain. How to use your incentive spirometer  Sit up on the edge of your bed or on a chair. Hold the incentive spirometer so that it is in an upright position. Before you use the spirometer, breathe out normally. Place the mouthpiece in your mouth. Make sure your lips are closed tightly around it. Breathe in slowly and as deeply as you can through your mouth, causing the piston or the ball to rise toward the top of the chamber. Hold your breath for 3-5 seconds, or for as long as possible. If the spirometer includes a coach indicator, use this to guide you in breathing. Slow down your breathing if the indicator goes above the marked areas. Remove the mouthpiece from your mouth and breathe out normally. The piston or ball will return to the bottom of the chamber. Rest for a few seconds, then repeat the steps 10 or more times. Take your time and take a few normal breaths between deep breaths so that you do not get dizzy or light-headed. Do this every  1-2 hours when you are awake. If the spirometer includes a goal marker to show the highest number you have reached (best effort), use this as a goal to work toward during each repetition. After each set of 10 deep breaths, cough a few times. This will help to make sure that your lungs are clear. If you have an incision on your chest or abdomen from surgery, place a pillow or a rolled-up towel firmly against the incision when you cough. This can help to reduce pain while taking deep breaths and coughing. General tips When you are able to get out of bed: Walk around often. Continue to take deep breaths and cough in order to clear your lungs. Keep using the incentive spirometer until your health care provider says it is okay to stop using it. If you have been in the hospital, you may be told to keep using the spirometer at home. Contact a health care provider if: You are having difficulty using the spirometer. You have trouble using the spirometer as  often as instructed. Your pain medicine is not giving enough relief for you to use the spirometer as told. You have a fever. Get help right away if: You develop shortness of breath. You develop a cough with bloody mucus from the lungs. You have fluid or blood coming from an incision site after you cough. Summary An incentive spirometer is a tool that can help you learn to take long, deep breaths to keep your lungs clear and active. You may be asked to use a spirometer after a surgery, if you have a lung problem or a history of smoking, or if you have been inactive for a long period of time. Use your incentive spirometer as instructed every 1-2 hours while you are awake. If you have an incision on your chest or abdomen, place a pillow or a rolled-up towel firmly against your incision when you cough. This will help to reduce pain. Get help right away if you have shortness of breath, you cough up bloody mucus, or blood comes from your incision when you cough. This information is not intended to replace advice given to you by your health care provider. Make sure you discuss any questions you have with your health care provider. Document Revised: 09/16/2019 Document Reviewed: 09/16/2019 Elsevier Patient Education  2023 [employer].

## 2022-11-23 ENCOUNTER — Ambulatory Visit
Admission: RE | Admit: 2022-11-23 | Discharge: 2022-11-23 | Disposition: A | Discharge status: Home / Self Care | Payer: 59 | Attending: Surgery | Admitting: Surgery

## 2022-11-23 ENCOUNTER — Encounter: Payer: Self-pay | Admitting: Surgery

## 2022-11-23 ENCOUNTER — Ambulatory Visit: Payer: 59 | Admitting: Urgent Care

## 2022-11-23 ENCOUNTER — Other Ambulatory Visit: Payer: Self-pay

## 2022-11-23 ENCOUNTER — Ambulatory Visit: Payer: 59 | Admitting: Anesthesiology

## 2022-11-23 ENCOUNTER — Encounter
Admission: RE | Disposition: A | Discharge status: Home / Self Care | Payer: Self-pay | Source: Home / Self Care | Attending: Surgery

## 2022-11-23 DIAGNOSIS — K449 Diaphragmatic hernia without obstruction or gangrene: Secondary | ICD-10-CM | POA: Diagnosis not present

## 2022-11-23 DIAGNOSIS — G709 Myoneural disorder, unspecified: Secondary | ICD-10-CM | POA: Diagnosis not present

## 2022-11-23 DIAGNOSIS — F418 Other specified anxiety disorders: Secondary | ICD-10-CM | POA: Diagnosis not present

## 2022-11-23 DIAGNOSIS — Z8673 Personal history of transient ischemic attack (TIA), and cerebral infarction without residual deficits: Secondary | ICD-10-CM | POA: Insufficient documentation

## 2022-11-23 DIAGNOSIS — Z8249 Family history of ischemic heart disease and other diseases of the circulatory system: Secondary | ICD-10-CM | POA: Insufficient documentation

## 2022-11-23 DIAGNOSIS — Z8711 Personal history of peptic ulcer disease: Secondary | ICD-10-CM | POA: Insufficient documentation

## 2022-11-23 DIAGNOSIS — I10 Essential (primary) hypertension: Secondary | ICD-10-CM | POA: Insufficient documentation

## 2022-11-23 DIAGNOSIS — F17210 Nicotine dependence, cigarettes, uncomplicated: Secondary | ICD-10-CM | POA: Insufficient documentation

## 2022-11-23 DIAGNOSIS — G459 Transient cerebral ischemic attack, unspecified: Secondary | ICD-10-CM

## 2022-11-23 DIAGNOSIS — Z79899 Other long term (current) drug therapy: Secondary | ICD-10-CM | POA: Diagnosis not present

## 2022-11-23 DIAGNOSIS — I2510 Atherosclerotic heart disease of native coronary artery without angina pectoris: Secondary | ICD-10-CM | POA: Diagnosis not present

## 2022-11-23 DIAGNOSIS — S83232A Complex tear of medial meniscus, current injury, left knee, initial encounter: Secondary | ICD-10-CM | POA: Diagnosis not present

## 2022-11-23 DIAGNOSIS — K219 Gastro-esophageal reflux disease without esophagitis: Secondary | ICD-10-CM | POA: Diagnosis not present

## 2022-11-23 DIAGNOSIS — M1712 Unilateral primary osteoarthritis, left knee: Secondary | ICD-10-CM | POA: Diagnosis not present

## 2022-11-23 DIAGNOSIS — M94262 Chondromalacia, left knee: Secondary | ICD-10-CM | POA: Diagnosis not present

## 2022-11-23 DIAGNOSIS — J449 Chronic obstructive pulmonary disease, unspecified: Secondary | ICD-10-CM | POA: Diagnosis not present

## 2022-11-23 DIAGNOSIS — Z8612 Personal history of poliomyelitis: Secondary | ICD-10-CM | POA: Insufficient documentation

## 2022-11-23 DIAGNOSIS — M23301 Other meniscus derangements, unspecified lateral meniscus, left knee: Secondary | ICD-10-CM | POA: Insufficient documentation

## 2022-11-23 DIAGNOSIS — Z720 Tobacco use: Secondary | ICD-10-CM

## 2022-11-23 HISTORY — PX: KNEE ARTHROSCOPY: SHX127

## 2022-11-23 SURGERY — ARTHROSCOPY, KNEE
Anesthesia: General | Site: Knee | Laterality: Left

## 2022-11-23 MED ORDER — LIDOCAINE HCL (PF) 1 % IJ SOLN
INTRAMUSCULAR | Status: AC
  Filled 2022-11-23: qty 30

## 2022-11-23 MED ORDER — CHLORHEXIDINE GLUCONATE 0.12 % MT SOLN
OROMUCOSAL | Status: AC
  Filled 2022-11-23: qty 15

## 2022-11-23 MED ORDER — RINGERS IRRIGATION IR SOLN
Status: DC | PRN
  Administered 2022-11-23: 3000 mL

## 2022-11-23 MED ORDER — DEXAMETHASONE SODIUM PHOSPHATE 10 MG/ML IJ SOLN
INTRAMUSCULAR | Status: AC
  Filled 2022-11-23: qty 1

## 2022-11-23 MED ORDER — OXYCODONE HCL 5 MG/5ML PO SOLN: 5.0000 mg | Freq: Once | ORAL | Status: DC | PRN

## 2022-11-23 MED ORDER — ACETAMINOPHEN 325 MG PO TABS: 325.0000 mg | ORAL_TABLET | Freq: Four times a day (QID) | ORAL | Status: DC | PRN

## 2022-11-23 MED ORDER — PANTOPRAZOLE SODIUM 40 MG PO TBEC: 40.0000 mg | DELAYED_RELEASE_TABLET | Freq: Every day | ORAL | 0 refills | Status: AC

## 2022-11-23 MED ORDER — LIDOCAINE HCL (CARDIAC) PF 100 MG/5ML IV SOSY
PREFILLED_SYRINGE | INTRAVENOUS | Status: DC | PRN
  Administered 2022-11-23: 60 mg via INTRAVENOUS

## 2022-11-23 MED ORDER — PROPOFOL 10 MG/ML IV BOLUS
INTRAVENOUS | Status: DC | PRN
  Administered 2022-11-23: 200 mg via INTRAVENOUS

## 2022-11-23 MED ORDER — ONDANSETRON HCL 4 MG/2ML IJ SOLN
INTRAMUSCULAR | Status: AC
  Filled 2022-11-23: qty 2

## 2022-11-23 MED ORDER — PROPOFOL 10 MG/ML IV BOLUS
INTRAVENOUS | Status: AC
  Filled 2022-11-23: qty 40

## 2022-11-23 MED ORDER — KETOROLAC TROMETHAMINE 15 MG/ML IJ SOLN
INTRAMUSCULAR | Status: AC
  Filled 2022-11-23: qty 1

## 2022-11-23 MED ORDER — LIDOCAINE HCL (PF) 2 % IJ SOLN
INTRAMUSCULAR | Status: AC
  Filled 2022-11-23: qty 5

## 2022-11-23 MED ORDER — FENTANYL CITRATE (PF) 100 MCG/2ML IJ SOLN
INTRAMUSCULAR | Status: DC | PRN
  Administered 2022-11-23 (×4): 25 ug via INTRAVENOUS

## 2022-11-23 MED ORDER — LIDOCAINE HCL 1 % IJ SOLN
INTRAMUSCULAR | Status: DC | PRN
  Administered 2022-11-23: 30 mL

## 2022-11-23 MED ORDER — BUPIVACAINE-EPINEPHRINE (PF) 0.5% -1:200000 IJ SOLN
INTRAMUSCULAR | Status: DC | PRN
  Administered 2022-11-23 (×2): 30 mL

## 2022-11-23 MED ORDER — SODIUM CHLORIDE 0.9 % IV SOLN: INTRAVENOUS | Status: DC

## 2022-11-23 MED ORDER — FENTANYL CITRATE (PF) 100 MCG/2ML IJ SOLN: 25.0000 ug | INTRAMUSCULAR | Status: DC | PRN

## 2022-11-23 MED ORDER — ORAL CARE MOUTH RINSE: 15.0000 mL | Freq: Once | OROMUCOSAL | Status: AC

## 2022-11-23 MED ORDER — LACTATED RINGERS IV SOLN: INTRAVENOUS | Status: DC

## 2022-11-23 MED ORDER — CEFAZOLIN SODIUM-DEXTROSE 2-4 GM/100ML-% IV SOLN
INTRAVENOUS | Status: AC
  Filled 2022-11-23: qty 100

## 2022-11-23 MED ORDER — CEFAZOLIN SODIUM-DEXTROSE 2-4 GM/100ML-% IV SOLN
2.0000 g | Freq: Once | INTRAVENOUS | Status: AC
  Administered 2022-11-23: 2 g via INTRAVENOUS

## 2022-11-23 MED ORDER — OXYCODONE HCL 5 MG PO TABS: 5.0000 mg | ORAL_TABLET | ORAL | 0 refills | Status: DC | PRN

## 2022-11-23 MED ORDER — ONDANSETRON HCL 4 MG/2ML IJ SOLN
INTRAMUSCULAR | Status: DC | PRN
  Administered 2022-11-23: 4 mg via INTRAVENOUS

## 2022-11-23 MED ORDER — EPINEPHRINE PF 1 MG/ML IJ SOLN
INTRAMUSCULAR | Status: AC
  Filled 2022-11-23: qty 1

## 2022-11-23 MED ORDER — OXYCODONE HCL 5 MG PO TABS: 5.0000 mg | ORAL_TABLET | ORAL | Status: DC | PRN

## 2022-11-23 MED ORDER — METOCLOPRAMIDE HCL 10 MG PO TABS: 5.0000 mg | ORAL_TABLET | Freq: Three times a day (TID) | ORAL | Status: DC | PRN

## 2022-11-23 MED ORDER — CHLORHEXIDINE GLUCONATE 0.12 % MT SOLN
15.0000 mL | Freq: Once | OROMUCOSAL | Status: AC
  Administered 2022-11-23: 15 mL via OROMUCOSAL

## 2022-11-23 MED ORDER — ONDANSETRON HCL 4 MG PO TABS: 4.0000 mg | ORAL_TABLET | Freq: Four times a day (QID) | ORAL | Status: DC | PRN

## 2022-11-23 MED ORDER — OXYCODONE HCL 5 MG PO TABS: 5.0000 mg | ORAL_TABLET | Freq: Once | ORAL | Status: DC | PRN

## 2022-11-23 MED ORDER — BUPIVACAINE HCL (PF) 0.5 % IJ SOLN
INTRAMUSCULAR | Status: AC
  Filled 2022-11-23: qty 60

## 2022-11-23 MED ORDER — MIDAZOLAM HCL 2 MG/2ML IJ SOLN
INTRAMUSCULAR | Status: AC
  Filled 2022-11-23: qty 2

## 2022-11-23 MED ORDER — DEXAMETHASONE SODIUM PHOSPHATE 10 MG/ML IJ SOLN
INTRAMUSCULAR | Status: DC | PRN
  Administered 2022-11-23: 4 mg via INTRAVENOUS

## 2022-11-23 MED ORDER — FENTANYL CITRATE (PF) 100 MCG/2ML IJ SOLN
INTRAMUSCULAR | Status: AC
  Filled 2022-11-23: qty 2

## 2022-11-23 MED ORDER — KETOROLAC TROMETHAMINE 15 MG/ML IJ SOLN
15.0000 mg | Freq: Once | INTRAMUSCULAR | Status: AC
  Administered 2022-11-23: 15 mg via INTRAVENOUS

## 2022-11-23 MED ORDER — MIDAZOLAM HCL 2 MG/2ML IJ SOLN
INTRAMUSCULAR | Status: DC | PRN
  Administered 2022-11-23: 2 mg via INTRAVENOUS

## 2022-11-23 MED ORDER — ONDANSETRON HCL 4 MG/2ML IJ SOLN: 4.0000 mg | Freq: Four times a day (QID) | INTRAMUSCULAR | Status: DC | PRN

## 2022-11-23 MED ORDER — METOCLOPRAMIDE HCL 5 MG/ML IJ SOLN: 5.0000 mg | Freq: Three times a day (TID) | INTRAMUSCULAR | Status: DC | PRN

## 2022-11-23 SURGICAL SUPPLY — 44 items
APL PRP STRL LF DISP 70% ISPRP (MISCELLANEOUS) ×2
BLADE FULL RADIUS 3.5 (BLADE) ×1 IMPLANT
BLADE SHAVER 4.5X7 STR FR (MISCELLANEOUS) ×1 IMPLANT
BNDG CMPR 5X62 HK CLSR LF (GAUZE/BANDAGES/DRESSINGS) ×1
BNDG CMPR 6"X 5 YARDS HK CLSR (GAUZE/BANDAGES/DRESSINGS) ×1
BNDG ELASTIC 6INX 5YD STR LF (GAUZE/BANDAGES/DRESSINGS) ×1 IMPLANT
BNDG ESMARCH 6 X 12 STRL LF (GAUZE/BANDAGES/DRESSINGS)
BNDG ESMARCH 6X12 STRL LF (GAUZE/BANDAGES/DRESSINGS) ×1 IMPLANT
CATH ROBINSON RED A/P 12FR (CATHETERS) IMPLANT
CHLORAPREP W/TINT 26 (MISCELLANEOUS) ×1 IMPLANT
COLLECTOR GRAFT TISSUE (SYSTAGENIX WOUND MANAGEMENT) ×1
CUFF TOURN SGL QUICK 24 (TOURNIQUET CUFF)
CUFF TOURN SGL QUICK 34 (TOURNIQUET CUFF)
CUFF TRNQT CYL 24X4X16.5-23 (TOURNIQUET CUFF) IMPLANT
CUFF TRNQT CYL 34X4.125X (TOURNIQUET CUFF) IMPLANT
CUP MEDICINE 2OZ PLAST GRAD ST (MISCELLANEOUS) ×1 IMPLANT
DRAPE ARTHRO LIMB 89X125 STRL (DRAPES) ×1 IMPLANT
DRAPE IMP U-DRAPE 54X76 (DRAPES) ×1 IMPLANT
ELECT REM PT RETURN 9FT ADLT (ELECTROSURGICAL) ×1
ELECTRODE REM PT RTRN 9FT ADLT (ELECTROSURGICAL) ×1 IMPLANT
GAUZE SPONGE 4X4 12PLY STRL (GAUZE/BANDAGES/DRESSINGS) ×1 IMPLANT
GLOVE BIO SURGEON STRL SZ8 (GLOVE) ×2 IMPLANT
GLOVE INDICATOR 8.0 STRL GRN (GLOVE) ×1 IMPLANT
GOWN STRL REUS W/ TWL LRG LVL3 (GOWN DISPOSABLE) ×1 IMPLANT
GOWN STRL REUS W/ TWL XL LVL3 (GOWN DISPOSABLE) ×2 IMPLANT
GOWN STRL REUS W/TWL LRG LVL3 (GOWN DISPOSABLE) ×1
GOWN STRL REUS W/TWL XL LVL3 (GOWN DISPOSABLE) ×2
IV LACTATED RINGER IRRG 3000ML (IV SOLUTION) ×1
IV LR IRRIG 3000ML ARTHROMATIC (IV SOLUTION) ×1 IMPLANT
KIT TURNOVER KIT A (KITS) ×1 IMPLANT
MANIFOLD NEPTUNE II (INSTRUMENTS) ×2 IMPLANT
NDL HYPO 21X1.5 SAFETY (NEEDLE) ×1 IMPLANT
NEEDLE HYPO 21X1.5 SAFETY (NEEDLE) ×1 IMPLANT
PACK ARTHROSCOPY KNEE (MISCELLANEOUS) ×1 IMPLANT
SLEEVE REMOTE CONTROL 5X12 (DRAPES) IMPLANT
SUT PROLENE 4 0 PS 2 18 (SUTURE) ×1 IMPLANT
SYR 30ML LL (SYRINGE) ×1 IMPLANT
SYR 50ML LL SCALE MARK (SYRINGE) ×1 IMPLANT
SYR TOOMEY 50ML (SYRINGE) IMPLANT
TISSUE GRAFT COLLECTOR (SYSTAGENIX WOUND MANAGEMENT) IMPLANT
TRAP FLUID SMOKE EVACUATOR (MISCELLANEOUS) ×1 IMPLANT
TUBE SET DOUBLEFLO INFLOW (TUBING) ×1 IMPLANT
WAND WEREWOLF FLOW 90D (MISCELLANEOUS) ×1 IMPLANT
WATER STERILE IRR 500ML POUR (IV SOLUTION) ×1 IMPLANT
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
## 2022-11-23 NOTE — Anesthesia Postprocedure Evaluation (Signed)
Anesthesia Post Note  Patient: Russell Rivas  Procedure(s) Performed: LEFT KNEE ARTHROSCOPY WITH DEBRIDEMENT, ABRASION OF CHONDROPLASTY, AND PARTIAL MEDIAL AND LATERAL MENISCECTOMIES. (Left: Knee)  Patient location during evaluation: PACU Anesthesia Type: General Level of consciousness: awake and alert Pain management: pain level controlled Vital Signs Assessment: post-procedure vital signs reviewed and stable Respiratory status: spontaneous breathing, nonlabored ventilation, respiratory function stable and patient connected to nasal cannula oxygen Cardiovascular status: blood pressure returned to baseline and stable Postop Assessment: no apparent nausea or vomiting Anesthetic complications: no   No notable events documented.   Last Vitals:  Vitals:   11/23/22 0900 11/23/22 0915  BP: (!) 154/98 (!) 157/104  Pulse: 88 85  Resp: (!) 9 (!) 22  Temp:    SpO2: 94% 91%    Last Pain:  Vitals:   11/23/22 0915  TempSrc:   PainSc: 0-No pain                 Louie Boston

## 2022-11-23 NOTE — Transfer of Care (Signed)
Immediate Anesthesia Transfer of Care Note  Patient: Migdalia Dk  Procedure(s) Performed: LEFT KNEE ARTHROSCOPY WITH DEBRIDEMENT, ABRASION OF CHONDROPLASTY, AND PARTIAL MEDIAL AND LATERAL MENISCECTOMIES. (Left: Knee)  Patient Location: PACU  Anesthesia Type:General  Level of Consciousness: awake, oriented, and patient cooperative  Airway & Oxygen Therapy: Patient Spontanous Breathing and Patient connected to face mask oxygen  Post-op Assessment: Report given to RN and Post -op Vital signs reviewed and stable  Post vital signs: stable  Last Vitals:  Vitals Value Taken Time  BP 138/84 11/23/22 0853  Temp    Pulse 91 11/23/22 0856  Resp 19 11/23/22 0856  SpO2 98 % 11/23/22 0856  Vitals shown include unvalidated device data.  Last Pain:  Vitals:   11/23/22 0627  TempSrc: Temporal  PainSc: 10-Worst pain ever         Complications: No notable events documented.

## 2022-11-23 NOTE — Op Note (Signed)
11/23/2022  9:13 AM  Patient:   Russell Rivas  Pre-Op Diagnosis:   Complex medial and lateral meniscus tears with underlying degenerative joint disease, left knee.  Postoperative diagnosis:   Complex medial meniscus tear, degenerative lateral meniscus tear, and degenerative joint disease, left knee.  Procedure:   Arthroscopic partial medial and lateral meniscectomies, abrasion chondroplasty of femoral trochlea, and reinjection of infrapatellar fat cells, left knee.  Surgeon:   Maryagnes Amos, MD  Anesthesia:   General LMA  Findings:   As above. There was a complex primarily radial tear with unstable flap component involving the posterior portion of the medial meniscus with partial compromise of the meniscal root. There was a degenerative tear involving the posterior horn and posterolateral portions of the lateral meniscus. 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 The anterior and posterior cruciate ligaments both are in satisfactory condition.  Complications:   None  EBL:   5 cc.  Total fluids:   700 cc of crystalloid.  Tourniquet time:   None  Drains:   None  Closure:   4-0 Prolene interrupted sutures.  Brief clinical note:   The patient is a 67 year old male with a 34-month history of progressively worsening pain and swelling of his left knee. His symptoms have progressed despite medications, activity modification, aspiration/injections, etc. His history and examination are consistent with a medial meniscus tear, confirmed by MRI scan. The MRI scan also demonstrated tearing of the lateral meniscus, as well as mild degenerative changes of the medial more so than lateral or patellofemoral compartments. The patient presents at this time for arthroscopy,  debridement, and partial medial and lateral meniscectomies.  Procedure:   The patient was brought into the operating room and lain in the supine position. After adequate general laryngeal mask anesthesia was obtained, a timeout was performed to verify the appropriate side. The patient's left knee was injected sterilely using a solution of 30 cc of 1% lidocaine and 30 cc of 0.5% Sensorcaine with epinephrine. The left lower extremity was prepped with ChloraPrep solution before being draped sterilely. Preoperative antibiotics were administered. The expected portal sites were injected with 0.5% Sensorcaine with epinephrine before the camera was placed in the anterolateral portal and instrumentation performed through the anteromedial portal.   The knee was sequentially examined beginning in the suprapatellar pouch, then progressing to the patellofemoral space, the medial gutter and compartment, the notch, and finally the lateral compartment and gutter. The findings were as described above. Abundant reactive synovial tissues anteriorly were debrided using the full-radius resector in order to improve visualization. These tissues were collected using the Arthrex graft net system for later reinjection into the knee. Hemostasis subsequently was achieved using the ArthroCare wand set at a low setting.    The medial meniscus was probed and found to demonstrate a complex tear pattern with both radial and longitudinal components resulting in 2 unstable flaps of meniscus.  These areas all were debrided back to stable margins using a combination of the full-radius resector, the straight and up-biting baskets, and the ArthroCare wand.  Laterally, the areas of degenerative meniscal tearing were debrided back to stable margins using the full-radius resector.  Subsequent probing of both the remaining medial and lateral meniscal tissues demonstrated good stability.   The area of grade III-IV chondromalacia involving the central  portion of the femoral trochlea was debrided back to stable margins using the full-radius resector. In addition, an area of grade II chondromalacia along the medial edge of the medial femoral condyle was debrided back to stable margins. This area of cartilage damage appears to have been the result of abrasion from the abundant infrapatellar fat tissue which had been debrided at the beginning of the case. The instruments were removed from the joint after suctioning the excess fluid.   The lateral portal site was closed using 4-0 Prolene interrupted sutures before the collected infrapatellar fat cells were reinjected into the knee through the medial portal using a Toomey syringe and red rubber catheter placed into the joint through a metal cannula the medial portal was then closed using 4-0 Prolene interrupted sutures. A sterile bulky dressing was applied to the knee before the patient was awakened, extubated, and returned to the recovery room in satisfactory condition after tolerating the procedure well.

## 2022-11-23 NOTE — Discharge Instructions (Addendum)
AMBULATORY SURGERY  DISCHARGE INSTRUCTIONS   The drugs that you were given will stay in your system until tomorrow so for the next 24 hours you should not:  Drive an automobile Make any legal decisions Drink any alcoholic beverage   You may resume regular meals tomorrow.  Today it is better to start with liquids and gradually work up to solid foods.  You may eat anything you prefer, but it is better to start with liquids, then soup and crackers, and gradually work up to solid foods.   Please notify your doctor immediately if you have any unusual bleeding, trouble breathing, redness and pain at the surgery site, drainage, fever, or pain not relieved by medication.    Additional Instructions:        Please contact your physician with any problems or Same Day Surgery at [phone], Monday through Friday 6 am to 4 pm, or  at [facility] number at [phone].Orthopedic discharge instructions: Keep dressing dry and intact.  May shower after dressing changed on post-op day #4 (Sunday).  Cover sutures with Band-Aids after drying off. Apply ice frequently to knee. Take ibuprofen 600-800 mg TID with meals for 5-7 days, then as necessary. Take pain medication as prescribed or ES Tylenol when needed.  May weight-bear as tolerated - use crutches or walker as needed. Follow-up in 10-14 days or as scheduled.

## 2022-11-23 NOTE — Anesthesia Preprocedure Evaluation (Signed)
Anesthesia Evaluation  Patient identified by MRN, date of birth, ID band Patient awake    Reviewed: Allergy & Precautions, NPO status , Patient's Chart, lab work & pertinent test results  History of Anesthesia Complications Negative for: history of anesthetic complications  Airway Mallampati: III  TM Distance: >3 FB Neck ROM: full    Dental  (+) Upper Dentures   Pulmonary COPD, Current Smoker   Pulmonary exam normal        Cardiovascular hypertension, On Medications + CAD  Normal cardiovascular exam     Neuro/Psych  PSYCHIATRIC DISORDERS Anxiety Depression    Hx of polio affecting both legs, had surgery as child TIA Neuromuscular disease    GI/Hepatic hiatal hernia, PUD,GERD  Medicated and Controlled,,(+)     substance abuse  alcohol use  Endo/Other  Hypothyroidism    Renal/GU      Musculoskeletal  (+) Arthritis ,    Abdominal   Peds  Hematology negative hematology ROS (+)   Anesthesia Other Findings Past Medical History: No date: Alcohol abuse     Comment:  pt reports drinking at least one pint everyday.  No date: Anxiety No date: Arthritis No date: COPD (chronic obstructive pulmonary disease) (HCC)     Comment:  NO inhalers 08/19/2020: COVID-19 No date: Depression No date: GERD (gastroesophageal reflux disease) No date: Hypertension No date: Hypothyroidism     Comment:  does not take medication at this time.  has in the past No date: IBS (irritable bowel syndrome) No date: Polio     Comment:  as a child, caused poblems in right knee. No date: TIA (transient ischemic attack)     Comment:  no deficits No date: Wears dentures     Comment:  partial upper  Past Surgical History: No date: APPENDECTOMY 10/02/2015: CARDIAC CATHETERIZATION; Left     Comment:  Procedure: Left Heart Cath and Coronary Angiography;                Surgeon: Laurier Nancy, MD;  Location: [facility] INVASIVE CV               LAB;   Service: Cardiovascular;  Laterality: Left; 12/08/2015: COLONOSCOPY WITH PROPOFOL; N/A     Comment:  Procedure: COLONOSCOPY WITH PROPOFOL;  Surgeon: Midge Minium, MD;  Location: [facility] ENDOSCOPY;  Service: Endoscopy;              Laterality: N/A; 12/30/2020: COLONOSCOPY WITH PROPOFOL; N/A     Comment:  Procedure: COLONOSCOPY WITH PROPOFOL;  Surgeon:               Pasty Spillers, MD;  Location: [facility] ENDOSCOPY;                Service: Endoscopy;  Laterality: N/A; 12/30/2020: ESOPHAGOGASTRODUODENOSCOPY; N/A     Comment:  Procedure: ESOPHAGOGASTRODUODENOSCOPY (EGD);  Surgeon:               Pasty Spillers, MD;  Location: [facility] ENDOSCOPY;                Service: Endoscopy;  Laterality: N/A; 2021: JOINT REPLACEMENT; Right 08/21/2019: KNEE ARTHROPLASTY; Right     Comment:  Procedure: COMPUTER ASSISTED TOTAL KNEE ARTHROPLASTY;                Surgeon: Donato Heinz, MD;  Location: [facility] ORS;  Service: Orthopedics;  Laterality: Right; 09/02/2015: KNEE ARTHROSCOPY; Right     Comment:  Procedure: ARTHROSCOPY KNEE, debridement, microfracture;              Surgeon: Erin Sons, MD;  Location: [facility] ORS;                Service: Orthopedics;  Laterality: Right; 11/26/2014: KNEE ARTHROSCOPY WITH MEDIAL MENISECTOMY; Right     Comment:  Procedure: KNEE ARTHROSCOPY WITH MEDIAL MENISECTOMY;                Surgeon: Erin Sons, MD;  Location: [facility] ORS;                Service: Orthopedics;  Laterality: Right; 1959: KNEE LIGAMENT RECONSTRUCTION; Right     Comment:  pt did not have ligaments, ligaments were made and               implanted. 1969: KNEE SURGERY; Left     Comment:  growth bone removed. 12/10/2020: LEFT HEART CATH AND CORONARY ANGIOGRAPHY; Left     Comment:  Procedure: LEFT HEART CATH AND CORONARY ANGIOGRAPHY;                Surgeon: Alwyn Pea, MD;  Location: [facility] INVASIVE              CV LAB;  Service: Cardiovascular;  Laterality:  Left; 06/29/2020: LUMBAR LAMINECTOMY/DECOMPRESSION MICRODISCECTOMY; Right     Comment:  Procedure: RIGHT L3/4 FAR LATERAL DISCECTOMY;  Surgeon:               Lucy Chris, MD;  Location: [facility] ORS;  Service:               Neurosurgery;  Laterality: Right;  L3- L4 2014: SHOULDER ARTHROSCOPY; Bilateral     Comment:  one in January and one in June     Reproductive/Obstetrics negative OB ROS                             Anesthesia Physical Anesthesia Plan  ASA: 3  Anesthesia Plan: General LMA   Post-op Pain Management: Ofirmev IV (intra-op)* and Toradol IV (intra-op)*   Induction: Intravenous  PONV Risk Score and Plan: 2 and Dexamethasone, Ondansetron, Midazolam and Treatment may vary due to age or medical condition  Airway Management Planned: LMA  Additional Equipment:   Intra-op Plan:   Post-operative Plan: Extubation in OR  Informed Consent: I have reviewed the patients History and Physical, chart, labs and discussed the procedure including the risks, benefits and alternatives for the proposed anesthesia with the patient or authorized representative who has indicated his/her understanding and acceptance.     Dental Advisory Given  Plan Discussed with: Anesthesiologist, CRNA and Surgeon  Anesthesia Plan Comments: (Patient consented for risks of anesthesia including but not limited to:  - adverse reactions to medications - damage to eyes, teeth, lips or other oral mucosa - nerve damage due to positioning  - sore throat or hoarseness - Damage to heart, brain, nerves, lungs, other parts of body or loss of life  Patient voiced understanding.)       Anesthesia Quick Evaluation

## 2022-11-24 ENCOUNTER — Other Ambulatory Visit: Payer: Self-pay

## 2022-11-24 ENCOUNTER — Encounter: Payer: Self-pay | Admitting: Surgery

## 2022-11-24 ENCOUNTER — Emergency Department
Admission: EM | Admit: 2022-11-24 | Discharge: 2022-11-24 | Disposition: A | Discharge status: Home / Self Care | Payer: 59 | Attending: Emergency Medicine | Admitting: Emergency Medicine

## 2022-11-24 DIAGNOSIS — Z48817 Encounter for surgical aftercare following surgery on the skin and subcutaneous tissue: Secondary | ICD-10-CM | POA: Insufficient documentation

## 2022-11-24 DIAGNOSIS — Z4889 Encounter for other specified surgical aftercare: Secondary | ICD-10-CM

## 2022-11-24 DIAGNOSIS — L7622 Postprocedural hemorrhage and hematoma of skin and subcutaneous tissue following other procedure: Secondary | ICD-10-CM | POA: Diagnosis present

## 2022-11-24 NOTE — ED Notes (Signed)
Pt given discharge instructions and return precautions at this time.  Pt verbalizes understanding of instructions.  Pt alert, in NAD and taken to lobby via wheelchair at this time.

## 2022-11-24 NOTE — ED Triage Notes (Signed)
Pt presents to ER with c/o left knee incision bleeding from surgical site.  Pt states he had knee surgery yesterday, and today, started to have bleeding that he feels is not what he is supposed to be having following surgery.  Pt is unsure if he has torn any of his stitches.  Pt endorses some pain to knee that radiates up and down his left leg.  Pt otherwise A&O x4 and in NAD.

## 2022-11-24 NOTE — ED Provider Notes (Signed)
[facility] Provider Note  Patient Contact: 10:45 PM (approximate)   History   Post-op Problem   HPI  Russell Rivas is a 67 y.o. male presents to the emergency department for a wound check.  Patient is status post knee arthroscopy on Nov 23, 2022.  Patient states that he had some oozing tonight from his suture sites and wanted to make sure that his wound looked okay.  He denies fever or chills.      Physical Exam   Triage Vital Signs: ED Triage Vitals  Enc Vitals Group     BP 11/24/22 2213 133/81     Pulse Rate 11/24/22 2213 91     Resp 11/24/22 2213 17     Temp 11/24/22 2213 98.2 F (36.8 C)     Temp Source 11/24/22 2213 Oral     SpO2 11/24/22 2213 94 %     Weight --      Height --      Head Circumference --      Peak Flow --      Pain Score 11/24/22 2214 10     Pain Loc --      Pain Edu? --      Excl. in GC? --     Most recent vital signs: Vitals:   11/24/22 2213  BP: 133/81  Pulse: 91  Resp: 17  Temp: 98.2 F (36.8 C)  SpO2: 94%     General: Alert and in no acute distress. Eyes:  PERRL. EOMI. Head: No acute traumatic findings ENT:      Nose: No congestion/rhinnorhea.      Mouth/Throat: Mucous membranes are moist. Neck: No stridor. No cervical spine tenderness to palpation. Cardiovascular:  Good peripheral perfusion Respiratory: Normal respiratory effort without tachypnea or retractions. Lungs CTAB. Good air entry to the bases with no decreased or absent breath sounds. Gastrointestinal: Bowel sounds 4 quadrants. Soft and nontender to palpation. No guarding or rigidity. No palpable masses. No distention. No CVA tenderness. Musculoskeletal: Full range of motion to all extremities.  Neurologic:  No gross focal neurologic deficits are appreciated.  Skin: Arthroscopy sites visualized with no expression of purulence.  No surrounding cellulitis.    ED Results / Procedures / Treatments   Labs (all labs ordered are listed, but  only abnormal results are displayed) Labs Reviewed - No data to display      PROCEDURES:  Critical Care performed: No  Procedures   MEDICATIONS ORDERED IN ED: Medications - No data to display   IMPRESSION / MDM / ASSESSMENT AND PLAN / ED COURSE  I reviewed the triage vital signs and the nursing notes.                              Assessment and plan Encounter for postsurgical wound check 67 year old male presents to the emergency department for a wound check.  Arthroscopy sites were visualized and showed no evidence of cellulitis.  There was no expression of purulence.  Recommended rest, ice, compression elevation at home.  Return precautions were given to return with new or worsening symptoms.      FINAL CLINICAL IMPRESSION(S) / ED DIAGNOSES   Final diagnoses:  Encounter for post surgical wound check     Rx / DC Orders   ED Discharge Orders     None        Note:  This document was prepared using Dragon voice recognition  software and may include unintentional dictation errors.   Pia Mau [place], PA-C 11/24/22 2248    Pilar Jarvis, MD 11/30/22 [phone]

## 2022-12-18 ENCOUNTER — Emergency Department
Admission: EM | Admit: 2022-12-18 | Discharge: 2022-12-18 | Discharge status: Against Medical Advice | Payer: 59 | Attending: Emergency Medicine | Admitting: Emergency Medicine

## 2022-12-18 ENCOUNTER — Other Ambulatory Visit: Payer: Self-pay

## 2022-12-18 DIAGNOSIS — Z5321 Procedure and treatment not carried out due to patient leaving prior to being seen by health care provider: Secondary | ICD-10-CM | POA: Diagnosis not present

## 2022-12-18 DIAGNOSIS — M25562 Pain in left knee: Secondary | ICD-10-CM | POA: Insufficient documentation

## 2022-12-18 NOTE — ED Triage Notes (Signed)
Pt to ed from home via POV for fluid built up on the left knee, which is a recent surgery knee. He is scheduled to see dr Rae Roam this month. Pt is having increased pain in the leg and the fluid is obvious in the knee. Pt states "I just need it drained today so I can work tomorrow.". Pt is caox4, in no acute distress and ambulatory in triage.

## 2023-09-25 ENCOUNTER — Other Ambulatory Visit: Payer: Self-pay | Admitting: Hospitalist

## 2023-09-25 DIAGNOSIS — N5089 Other specified disorders of the male genital organs: Secondary | ICD-10-CM

## 2023-09-25 DIAGNOSIS — N50811 Right testicular pain: Secondary | ICD-10-CM

## 2023-09-26 ENCOUNTER — Ambulatory Visit
Admission: RE | Admit: 2023-09-26 | Discharge: 2023-09-26 | Disposition: A | Discharge status: Home / Self Care | Source: Ambulatory Visit | Attending: Internal Medicine | Admitting: Internal Medicine

## 2023-09-26 DIAGNOSIS — N5089 Other specified disorders of the male genital organs: Secondary | ICD-10-CM | POA: Insufficient documentation

## 2023-09-26 DIAGNOSIS — N50811 Right testicular pain: Secondary | ICD-10-CM | POA: Insufficient documentation

## 2023-10-31 ENCOUNTER — Ambulatory Visit (INDEPENDENT_AMBULATORY_CARE_PROVIDER_SITE_OTHER): Admitting: Urology

## 2023-10-31 ENCOUNTER — Encounter: Payer: Self-pay | Admitting: Urology

## 2023-10-31 VITALS — BP 154/95 | Ht 66.0 in | Wt 198.0 lb

## 2023-10-31 DIAGNOSIS — N5082 Scrotal pain: Secondary | ICD-10-CM | POA: Diagnosis not present

## 2023-10-31 MED ORDER — CELECOXIB 200 MG PO CAPS: 200.0000 mg | ORAL_CAPSULE | Freq: Two times a day (BID) | ORAL | 0 refills | Status: DC

## 2023-10-31 NOTE — Progress Notes (Signed)
 10/31/23 1:58 PM   Russell Rivas 1956-04-06 [id number]  CC: Right scrotal pain  HPI: 68 year old male who reports about 4 months of right-sided scrotal pain.  This is primary located at the epididymis.  He was evaluated with a scrotal ultrasound which was benign aside from a small left-sided varicocele.  He denies any left-sided pain.  He was treated with a course of antibiotics with no improvement.  Urinalysis was benign.  He denies any urinary symptoms at this time or gross hematuria.   PMH: Past Medical History:  Diagnosis Date   Alcohol abuse    pt reports drinking at least one pint everyday.    Anxiety    Arthritis    COPD (chronic obstructive pulmonary disease) (HCC)    NO inhalers   COVID-19 08/19/2020   Depression    GERD (gastroesophageal reflux disease)    Hypertension    Hypothyroidism    does not take medication at this time.  has in the past   IBS (irritable bowel syndrome)    Polio    as a child, caused poblems in right knee.   TIA (transient ischemic attack)    no deficits   Wears dentures    partial upper    Surgical History: Past Surgical History:  Procedure Laterality Date   APPENDECTOMY     CARDIAC CATHETERIZATION Left 10/02/2015   Procedure: Left Heart Cath and Coronary Angiography;  Surgeon: Cherrie Cornwall, MD;  Location: [facility];  Service: Cardiovascular;  Laterality: Left;   COLONOSCOPY WITH PROPOFOL  N/A 12/08/2015   Procedure: COLONOSCOPY WITH PROPOFOL ;  Surgeon: Marnee Sink, MD;  Location: [facility];  Service: Endoscopy;  Laterality: N/A;   COLONOSCOPY WITH PROPOFOL  N/A 12/30/2020   Procedure: COLONOSCOPY WITH PROPOFOL ;  Surgeon: Irby Mannan, MD;  Location: [facility];  Service: Endoscopy;  Laterality: N/A;   ESOPHAGOGASTRODUODENOSCOPY N/A 12/30/2020   Procedure: ESOPHAGOGASTRODUODENOSCOPY (EGD);  Surgeon: Irby Mannan, MD;  Location: [facility] ENDOSCOPY;  Service: Endoscopy;  Laterality: N/A;   JOINT  REPLACEMENT Right 2021   KNEE ARTHROPLASTY Right 08/21/2019   Procedure: COMPUTER ASSISTED TOTAL KNEE ARTHROPLASTY;  Surgeon: Arlyne Lame, MD;  Location: [facility];  Service: Orthopedics;  Laterality: Right;   KNEE ARTHROSCOPY Right 09/02/2015   Procedure: ARTHROSCOPY KNEE, debridement, microfracture;  Surgeon: Josephus Nida, MD;  Location: [facility];  Service: Orthopedics;  Laterality: Right;   KNEE ARTHROSCOPY Left 11/23/2022   Procedure: LEFT KNEE ARTHROSCOPY WITH DEBRIDEMENT, ABRASION OF CHONDROPLASTY, AND PARTIAL MEDIAL AND LATERAL MENISCECTOMIES.;  Surgeon: Elner Hahn, MD;  Location: [facility];  Service: Orthopedics;  Laterality: Left;   KNEE ARTHROSCOPY WITH MEDIAL MENISECTOMY Right 11/26/2014   Procedure: KNEE ARTHROSCOPY WITH MEDIAL MENISECTOMY;  Surgeon: Josephus Nida, MD;  Location: [facility];  Service: Orthopedics;  Laterality: Right;   KNEE LIGAMENT RECONSTRUCTION Right 1959   pt did not have ligaments, ligaments were made and implanted.   KNEE SURGERY Left 1969   growth bone removed.   LEFT HEART CATH AND CORONARY ANGIOGRAPHY Left 12/10/2020   Procedure: LEFT HEART CATH AND CORONARY ANGIOGRAPHY;  Surgeon: Antonette Batters, MD;  Location: [facility];  Service: Cardiovascular;  Laterality: Left;   LUMBAR LAMINECTOMY/DECOMPRESSION MICRODISCECTOMY Right 06/29/2020   Procedure: RIGHT L3/4 FAR LATERAL DISCECTOMY;  Surgeon: Berta Brittle, MD;  Location: [facility] OR[facility];  Service: Neurosurgery;  Laterality: Right;  L3- L4   SHOULDER ARTHROSCOPY Bilateral 2014   one in January and one in June    Family  History: Family History  Problem Relation Age of Onset   COPD Mother    Heart disease Father     Social History:  reports that he has been smoking cigarettes. He has a 35 pack-year smoking history. He has never used smokeless tobacco. He reports current alcohol use of about 35.0 - 42.0 standard drinks of alcohol per week. He reports that he does not use drugs.  Physical  Exam: BP (!) 154/95   Ht 5\' 6"  (1.676 m)   Wt 198 lb (89.8 kg)   BMI 31.96 kg/m    Constitutional:  Alert and oriented, No acute distress. Cardiovascular: No clubbing, cyanosis, or edema. Respiratory: Normal respiratory effort, no increased work of breathing. GI: Abdomen is soft, nontender, nondistended, no abdominal masses GU: Testicles atrophic, no testicular masses, small left varicocele, right epididymal tenderness  Laboratory Data: Reviewed, see HPI  Pertinent Imaging: I have personally viewed and interpreted the scrotal ultrasound showing normal testicles bilaterally, small left varicocele.  Assessment & Plan:   68 year old male with 4 months of right testicular pain of unclear etiology.  Reassurance provided regarding normal scrotal ultrasound and small left-sided varicocele which would not have any relation to his right sided pain.  Failed a trial of antibiotics.  I recommended snug fitting underwear, and a 2-week course of Celebrex .  RTC 3 to 4 weeks symptom check.  Consider trial of gabapentin  or amitriptyline if no improvement after Celebrex , or referral to Dr. Jarvis Mesa to consider denervation   Jay Meth, MD 10/31/2023  [facility] [address] [place], [place] [address] [phone]
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
## 2023-11-15 ENCOUNTER — Ambulatory Visit (INDEPENDENT_AMBULATORY_CARE_PROVIDER_SITE_OTHER): Admitting: Orthopedic Surgery

## 2023-11-15 ENCOUNTER — Encounter: Payer: Self-pay | Admitting: Orthopedic Surgery

## 2023-11-15 VITALS — BP 140/88 | Ht 66.0 in | Wt 198.0 lb

## 2023-11-15 DIAGNOSIS — M4316 Spondylolisthesis, lumbar region: Secondary | ICD-10-CM

## 2023-11-15 DIAGNOSIS — M48061 Spinal stenosis, lumbar region without neurogenic claudication: Secondary | ICD-10-CM

## 2023-11-15 DIAGNOSIS — M4726 Other spondylosis with radiculopathy, lumbar region: Secondary | ICD-10-CM | POA: Diagnosis not present

## 2023-11-15 DIAGNOSIS — M47816 Spondylosis without myelopathy or radiculopathy, lumbar region: Secondary | ICD-10-CM

## 2023-11-15 DIAGNOSIS — M5126 Other intervertebral disc displacement, lumbar region: Secondary | ICD-10-CM | POA: Diagnosis not present

## 2023-11-15 DIAGNOSIS — M5416 Radiculopathy, lumbar region: Secondary | ICD-10-CM

## 2023-11-15 NOTE — Patient Instructions (Signed)
 It was so nice to see you today. Thank you so much for coming in.    You have some wear and tear in your back and this is likely causing your back and left leg pain.   I want you to see physical medicine and rehab at the [facility] to discuss possible injections in your back with Dr. Aleen Ammons. They should call you to schedule an appointment or you can call them at [phone].   I will see you back in 6-8 weeks. Please do not hesitate to call if you have any questions or concerns. You can also message me in MyChart.   Lucetta Russel PA-C [phone]     The physicians and staff at [facility] at [facility] are committed to providing excellent care. You may receive a survey asking for feedback about your experience at our office. We value you your feedback and appreciate you taking the time to to fill it out. The [facility] leadership team is also available to discuss your experience in person, feel free to contact us  [phone].

## 2023-11-27 ENCOUNTER — Encounter: Payer: Self-pay | Admitting: Urology

## 2023-11-27 ENCOUNTER — Ambulatory Visit (INDEPENDENT_AMBULATORY_CARE_PROVIDER_SITE_OTHER): Admitting: Urology

## 2023-11-27 VITALS — BP 155/91 | HR 91 | Ht 66.0 in | Wt 188.0 lb

## 2023-11-27 DIAGNOSIS — N5082 Scrotal pain: Secondary | ICD-10-CM

## 2023-11-27 MED ORDER — NYSTATIN 100000 UNIT/GM EX CREA: 1.0000 | TOPICAL_CREAM | Freq: Two times a day (BID) | CUTANEOUS | 3 refills | Status: AC

## 2023-11-27 NOTE — Progress Notes (Addendum)
 11/27/2023 2:07 PM   Russell Rivas December 21, 1955 [id number]  Referring provider: Little Riff, MD [address] [facility] [address] [facility] [place],  [place] [address]  Urological history: 1. None    Chief Complaint  Patient presents with   Testicle Pain    Follow up    HPI: Russell Rivas is a 68 y.o. man who presents today for follow up for right scrotal pain.   Previous records reviewed.   He initially saw Dr. Estanislao Heimlich on October 31, 2023 for 4 months of right-sided scrotal pain.  It was primarily located at the right epididymis.  It was evaluated with scrotal ultrasound which was benign aside from a small left-sided varicocele.  He was treated with a course of antibiotics with no improvement.  His urinalysis was benign.  He was given 2-week course of Celebrex  and recommended to wear snug fitting underwear.  He states the Celebrex  did help with some of the pain, but he continues to have some right testicular discomfort.  He also has noted some itching in the groin area.  Patient denies any modifying or aggravating factors.  Patient denies any recent UTI's, gross hematuria, dysuria or suprapubic/flank pain.  Patient denies any fevers, chills, nausea or vomiting.    The testicles are primarily innervated by nerves originating from the L1-L2 and S2-S4 spinal levels.  Scrotum: . The skin of the scrotum, which houses the testicles, is supplied by the S4 dermatome.   He has known slip at L4-L5 and L5-S1, facet hypertrophy L4-S1, and diffuse lumbar spondylosis. He has spinal stenosis at L4-L5 along with moderate right foraminal stenosis and moderate left foraminal stenosis L5-S1.     PMH: Past Medical History:  Diagnosis Date   Alcohol abuse    pt reports drinking at least one pint everyday.    Anxiety    Arthritis    COPD (chronic obstructive pulmonary disease) (HCC)    NO inhalers   COVID-19 08/19/2020   Depression    GERD (gastroesophageal reflux disease)    Hypertension     Hypothyroidism    does not take medication at this time.  has in the past   IBS (irritable bowel syndrome)    Polio    as a child, caused poblems in right knee.   TIA (transient ischemic attack)    no deficits   Wears dentures    partial upper    Surgical History: Past Surgical History:  Procedure Laterality Date   APPENDECTOMY     CARDIAC CATHETERIZATION Left 10/02/2015   Procedure: Left Heart Cath and Coronary Angiography;  Surgeon: Cherrie Cornwall, MD;  Location: [facility] INVASIVE CV LAB;  Service: Cardiovascular;  Laterality: Left;   COLONOSCOPY WITH PROPOFOL  N/A 12/08/2015   Procedure: COLONOSCOPY WITH PROPOFOL ;  Surgeon: Marnee Sink, MD;  Location: [facility] ENDOSCOPY;  Service: Endoscopy;  Laterality: N/A;   COLONOSCOPY WITH PROPOFOL  N/A 12/30/2020   Procedure: COLONOSCOPY WITH PROPOFOL ;  Surgeon: Irby Mannan, MD;  Location: [facility] ENDOSCOPY;  Service: Endoscopy;  Laterality: N/A;   ESOPHAGOGASTRODUODENOSCOPY N/A 12/30/2020   Procedure: ESOPHAGOGASTRODUODENOSCOPY (EGD);  Surgeon: Irby Mannan, MD;  Location: [facility] ENDOSCOPY;  Service: Endoscopy;  Laterality: N/A;   JOINT REPLACEMENT Right 2021   KNEE ARTHROPLASTY Right 08/21/2019   Procedure: COMPUTER ASSISTED TOTAL KNEE ARTHROPLASTY;  Surgeon: Arlyne Lame, MD;  Location: [facility] ORS;  Service: Orthopedics;  Laterality: Right;   KNEE ARTHROSCOPY Right 09/02/2015   Procedure: ARTHROSCOPY KNEE, debridement, microfracture;  Surgeon: Josephus Nida, MD;  Location: [facility] ORS;  Service: Orthopedics;  Laterality: Right;   KNEE ARTHROSCOPY Left 11/23/2022   Procedure: LEFT KNEE ARTHROSCOPY WITH DEBRIDEMENT, ABRASION OF CHONDROPLASTY, AND PARTIAL MEDIAL AND LATERAL MENISCECTOMIES.;  Surgeon: Elner Hahn, MD;  Location: [facility] ORS;  Service: Orthopedics;  Laterality: Left;   KNEE ARTHROSCOPY WITH MEDIAL MENISECTOMY Right 11/26/2014   Procedure: KNEE ARTHROSCOPY WITH MEDIAL MENISECTOMY;  Surgeon: Josephus Nida, MD;  Location: [facility]  ORS;  Service: Orthopedics;  Laterality: Right;   KNEE LIGAMENT RECONSTRUCTION Right 1959   pt did not have ligaments, ligaments were made and implanted.   KNEE SURGERY Left 1969   growth bone removed.   LEFT HEART CATH AND CORONARY ANGIOGRAPHY Left 12/10/2020   Procedure: LEFT HEART CATH AND CORONARY ANGIOGRAPHY;  Surgeon: Antonette Batters, MD;  Location: [facility] CV LAB;  Service: Cardiovascular;  Laterality: Left;   LUMBAR LAMINECTOMY/DECOMPRESSION MICRODISCECTOMY Right 06/29/2020   Procedure: RIGHT L3/4 FAR LATERAL DISCECTOMY;  Surgeon: Berta Brittle, MD;  Location: [facility] ORS;  Service: Neurosurgery;  Laterality: Right;  L3- L4   SHOULDER ARTHROSCOPY Bilateral 2014   one in January and one in June    Home Medications:  Allergies as of 11/27/2023       Reactions   Hydrocodone  Itching   Penicillins Nausea And Vomiting, Rash   Did it involve swelling of the face/tongue/throat, SOB, or low BP? No Did it involve sudden or severe rash/hives, skin peeling, or any reaction on the inside of your mouth or nose? No Did you need to seek medical attention at a hospital or doctor's office? No When did it last happen?      10 + years If all above answers are "NO", may proceed with cephalosporin use.        Medication List        Accurate as of Nov 27, 2023  2:07 PM. If you have any questions, ask your nurse or doctor.          baclofen 10 MG tablet Commonly known as: LIORESAL Take 10 mg by mouth daily as needed.   celecoxib  200 MG capsule Commonly known as: CeleBREX  Take 1 capsule (200 mg total) by mouth 2 (two) times daily.   magnesium  oxide 400 MG tablet Commonly known as: MAG-OX Take 1,000 mg by mouth in the morning.   multivitamin with minerals tablet Take 1 tablet by mouth in the morning.   nystatin  cream Commonly known as: MYCOSTATIN  Apply 1 Application topically 2 (two) times daily.   oxyCODONE -acetaminophen  7.5-325 MG tablet Commonly known as: PERCOCET Take  1 tablet by mouth 3 (three) times daily as needed.   pantoprazole  40 MG tablet Commonly known as: PROTONIX  Take 1 tablet (40 mg total) by mouth daily for 28 days.   traZODone 150 MG tablet Commonly known as: DESYREL Take 150 mg by mouth at bedtime.        Allergies:  Allergies  Allergen Reactions   Hydrocodone  Itching   Penicillins Nausea And Vomiting and Rash    Did it involve swelling of the face/tongue/throat, SOB, or low BP? No Did it involve sudden or severe rash/hives, skin peeling, or any reaction on the inside of your mouth or nose? No Did you need to seek medical attention at a hospital or doctor's office? No When did it last happen?      10 + years If all above answers are "NO", may proceed with cephalosporin use.    Family History: Family History  Problem Relation Age of  Onset   COPD Mother    Heart disease Father     Social History:  reports that he has been smoking cigarettes. He has a 35 pack-year smoking history. He has never used smokeless tobacco. He reports current alcohol use of about 35.0 - 42.0 standard drinks of alcohol per week. He reports that he does not use drugs.  ROS: Pertinent ROS in HPI  Physical Exam: BP (!) 155/91   Pulse 91   Ht 5\' 6"  (1.676 m)   Wt 188 lb (85.3 kg)   BMI 30.34 kg/m   Constitutional:  Well nourished. Alert and oriented, No acute distress. HEENT: [place] AT, moist mucus membranes.  Trachea midline Cardiovascular: No clubbing, cyanosis, or edema. Respiratory: Normal respiratory effort, no increased work of breathing. GU: No CVA tenderness.  No bladder fullness or masses.  Patient with circumcised phallus.   Urethral meatus is patent.  No penile discharge. No penile lesions or rashes.  Scrotum without lesions, cysts, rashes and/or edema.  Testicles are located scrotally bilaterally, they are atrophic. No masses are appreciated in the testicles. Left and right epididymis are normal.  A light rash is seen in the grove of the  right inguinal/scrotum.  Neurologic: Grossly intact, no focal deficits, moving all 4 extremities. Psychiatric: Normal mood and affect.  Laboratory Data: N/A    Pertinent Imaging: N/A  Assessment & Plan:    1. Right scrotal pain - Discussed that since his exams have been benign and the scrotal ultrasound did not discover a reason for his scrotal pain, that the pain may be referred versus stemming from the testicle or scrotum - He has a follow-up on Wednesday to discuss lumbar injections, we discussed pursuing a pelvic MRI if the lumbar injections do not improve his pain or if they decide to pursue new imaging of his lumbar spine and no etiology is discovered for the scrotal pain - he will let us  know - He will continue the Celebrex  in the interim  2. Tinea cruris - Nystatin  cream BID   -Return for Patient to call .  These notes generated with voice recognition software. I apologize for typographical errors.  Briant Camper  [facility] [address]  [address] [place], [place] [address] [phone]
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
## 2024-01-15 ENCOUNTER — Ambulatory Visit: Admitting: Orthopedic Surgery

## 2024-01-16 ENCOUNTER — Ambulatory Visit (INDEPENDENT_AMBULATORY_CARE_PROVIDER_SITE_OTHER): Admitting: Orthopedic Surgery

## 2024-01-16 ENCOUNTER — Encounter: Payer: Self-pay | Admitting: Orthopedic Surgery

## 2024-01-16 VITALS — BP 134/90 | Ht 66.5 in | Wt 181.2 lb

## 2024-01-16 DIAGNOSIS — M47896 Other spondylosis, lumbar region: Secondary | ICD-10-CM | POA: Diagnosis not present

## 2024-01-16 DIAGNOSIS — M4807 Spinal stenosis, lumbosacral region: Secondary | ICD-10-CM | POA: Diagnosis not present

## 2024-01-16 DIAGNOSIS — Z9889 Other specified postprocedural states: Secondary | ICD-10-CM

## 2024-01-16 DIAGNOSIS — M48061 Spinal stenosis, lumbar region without neurogenic claudication: Secondary | ICD-10-CM

## 2024-01-16 DIAGNOSIS — M4316 Spondylolisthesis, lumbar region: Secondary | ICD-10-CM

## 2024-01-16 DIAGNOSIS — M5127 Other intervertebral disc displacement, lumbosacral region: Secondary | ICD-10-CM

## 2024-01-16 DIAGNOSIS — M47897 Other spondylosis, lumbosacral region: Secondary | ICD-10-CM

## 2024-01-16 DIAGNOSIS — M47816 Spondylosis without myelopathy or radiculopathy, lumbar region: Secondary | ICD-10-CM

## 2024-01-16 DIAGNOSIS — M5126 Other intervertebral disc displacement, lumbar region: Secondary | ICD-10-CM

## 2024-01-16 DIAGNOSIS — M5416 Radiculopathy, lumbar region: Secondary | ICD-10-CM

## 2024-01-16 NOTE — Patient Instructions (Signed)
 It was so nice to see you today. Thank you so much for coming in.    I want to get an updated MRI of your lower back to look into things further. We will get this approved through your insurance and [facility] will call you to schedule the appointment. Ask about your patient responsibility. You do not need to pay this prior to getting MRI, they can bill you.   Remind them to do your lumbar xrays when you have the MRI done.   [facility] (building with the white pillars) is located off of [address]. The address is [address] [place], [place] [address].    After you have the MRI and xrays, it takes 14-28 days for me to get the results back. If I don't get them in 2 weeks, we will call.   Once I have the results, we will call you to schedule a follow up phone visit with me to review them.   Please do not hesitate to call if you have any questions or concerns. You can also message me in MyChart.   Glade Boys PA-C [phone]     The physicians and staff at [facility] Neurosurgery at [facility] are committed to providing excellent care. You may receive a survey asking for feedback about your experience at our office. We value you your feedback and appreciate you taking the time to to fill it out. The [facility] leadership team is also available to discuss your experience in person, feel free to contact us  [phone].

## 2024-01-19 ENCOUNTER — Ambulatory Visit
Admission: RE | Admit: 2024-01-19 | Discharge: 2024-01-19 | Disposition: A | Discharge status: Home / Self Care | Source: Ambulatory Visit | Attending: Orthopedic Surgery | Admitting: Orthopedic Surgery

## 2024-01-19 DIAGNOSIS — M47816 Spondylosis without myelopathy or radiculopathy, lumbar region: Secondary | ICD-10-CM | POA: Diagnosis present

## 2024-01-19 DIAGNOSIS — M5416 Radiculopathy, lumbar region: Secondary | ICD-10-CM | POA: Insufficient documentation

## 2024-01-19 DIAGNOSIS — M48061 Spinal stenosis, lumbar region without neurogenic claudication: Secondary | ICD-10-CM | POA: Diagnosis present

## 2024-01-19 DIAGNOSIS — Z9889 Other specified postprocedural states: Secondary | ICD-10-CM | POA: Diagnosis present

## 2024-01-19 DIAGNOSIS — M4316 Spondylolisthesis, lumbar region: Secondary | ICD-10-CM | POA: Insufficient documentation

## 2024-05-03 ENCOUNTER — Other Ambulatory Visit: Payer: Self-pay | Admitting: Physical Medicine & Rehabilitation

## 2024-05-03 DIAGNOSIS — M542 Cervicalgia: Secondary | ICD-10-CM

## 2024-05-11 ENCOUNTER — Ambulatory Visit
Admission: RE | Admit: 2024-05-11 | Discharge: 2024-05-11 | Disposition: A | Discharge status: Home / Self Care | Source: Ambulatory Visit | Attending: Physical Medicine & Rehabilitation | Admitting: Physical Medicine & Rehabilitation

## 2024-05-11 DIAGNOSIS — M542 Cervicalgia: Secondary | ICD-10-CM
